# Patient Record
Sex: Male | Born: 1950 | Race: White | Hispanic: No | Marital: Single | Smoking: Former smoker
Health system: Southern US, Community
[De-identification: ages and names within clinical notes are randomized; demographics above are authoritative.]

## PROBLEM LIST (undated history)

## (undated) DIAGNOSIS — M549 Dorsalgia, unspecified: Secondary | ICD-10-CM

## (undated) DIAGNOSIS — C801 Malignant (primary) neoplasm, unspecified: Secondary | ICD-10-CM

## (undated) DIAGNOSIS — R911 Solitary pulmonary nodule: Secondary | ICD-10-CM

## (undated) DIAGNOSIS — M1990 Unspecified osteoarthritis, unspecified site: Secondary | ICD-10-CM

## (undated) DIAGNOSIS — J449 Chronic obstructive pulmonary disease, unspecified: Secondary | ICD-10-CM

## (undated) DIAGNOSIS — G8929 Other chronic pain: Secondary | ICD-10-CM

## (undated) DIAGNOSIS — I639 Cerebral infarction, unspecified: Secondary | ICD-10-CM

## (undated) DIAGNOSIS — H9190 Unspecified hearing loss, unspecified ear: Secondary | ICD-10-CM

## (undated) DIAGNOSIS — R112 Nausea with vomiting, unspecified: Secondary | ICD-10-CM

## (undated) DIAGNOSIS — Z9889 Other specified postprocedural states: Secondary | ICD-10-CM

## (undated) DIAGNOSIS — H8109 Meniere's disease, unspecified ear: Secondary | ICD-10-CM

## (undated) HISTORY — DX: Meniere's disease, unspecified ear: H81.09

## (undated) HISTORY — PX: NASAL SINUS SURGERY: SHX719

## (undated) HISTORY — PX: OTHER SURGICAL HISTORY: SHX169

## (undated) HISTORY — PX: BACK SURGERY: SHX140

## (undated) HISTORY — PX: BLADDER SURGERY: SHX569

---

## 2001-03-19 ENCOUNTER — Other Ambulatory Visit: Admission: RE | Admit: 2001-03-19 | Discharge: 2001-03-22 | Payer: Self-pay | Admitting: Urology

## 2003-05-04 ENCOUNTER — Ambulatory Visit (HOSPITAL_COMMUNITY): Admission: RE | Admit: 2003-05-04 | Discharge: 2003-05-04 | Payer: Self-pay | Admitting: Urology

## 2003-07-30 ENCOUNTER — Ambulatory Visit (HOSPITAL_COMMUNITY): Admission: RE | Admit: 2003-07-30 | Discharge: 2003-07-30 | Payer: Self-pay | Admitting: Internal Medicine

## 2004-03-14 ENCOUNTER — Ambulatory Visit (HOSPITAL_COMMUNITY): Admission: RE | Admit: 2004-03-14 | Discharge: 2004-03-14 | Payer: Self-pay | Admitting: General Surgery

## 2004-09-27 ENCOUNTER — Encounter (HOSPITAL_COMMUNITY): Admission: RE | Admit: 2004-09-27 | Discharge: 2004-10-27 | Payer: Self-pay | Admitting: Internal Medicine

## 2005-02-15 ENCOUNTER — Ambulatory Visit (HOSPITAL_COMMUNITY): Admission: RE | Admit: 2005-02-15 | Discharge: 2005-02-15 | Payer: Self-pay | Admitting: Urology

## 2006-01-03 ENCOUNTER — Ambulatory Visit (HOSPITAL_COMMUNITY): Admission: RE | Admit: 2006-01-03 | Discharge: 2006-01-03 | Payer: Self-pay | Admitting: Internal Medicine

## 2006-07-16 ENCOUNTER — Ambulatory Visit (HOSPITAL_COMMUNITY): Admission: RE | Admit: 2006-07-16 | Discharge: 2006-07-16 | Payer: Self-pay | Admitting: Internal Medicine

## 2006-12-13 ENCOUNTER — Emergency Department (HOSPITAL_COMMUNITY): Admission: EM | Admit: 2006-12-13 | Discharge: 2006-12-13 | Payer: Self-pay | Admitting: Emergency Medicine

## 2007-12-23 ENCOUNTER — Ambulatory Visit (HOSPITAL_COMMUNITY): Admission: RE | Admit: 2007-12-23 | Discharge: 2007-12-23 | Payer: Self-pay | Admitting: Neurosurgery

## 2008-01-28 ENCOUNTER — Ambulatory Visit (HOSPITAL_COMMUNITY): Admission: RE | Admit: 2008-01-28 | Discharge: 2008-01-28 | Payer: Self-pay | Admitting: Neurosurgery

## 2008-03-23 ENCOUNTER — Inpatient Hospital Stay (HOSPITAL_COMMUNITY): Admission: RE | Admit: 2008-03-23 | Discharge: 2008-03-25 | Payer: Self-pay | Admitting: Neurosurgery

## 2008-08-10 ENCOUNTER — Encounter (INDEPENDENT_AMBULATORY_CARE_PROVIDER_SITE_OTHER): Payer: Self-pay | Admitting: Urology

## 2008-08-10 ENCOUNTER — Ambulatory Visit (HOSPITAL_COMMUNITY): Admission: RE | Admit: 2008-08-10 | Discharge: 2008-08-10 | Payer: Self-pay | Admitting: Urology

## 2009-08-19 ENCOUNTER — Encounter: Admission: RE | Admit: 2009-08-19 | Discharge: 2009-08-19 | Payer: Self-pay | Admitting: Neurosurgery

## 2009-09-02 ENCOUNTER — Inpatient Hospital Stay (HOSPITAL_COMMUNITY): Admission: RE | Admit: 2009-09-02 | Discharge: 2009-09-02 | Payer: Self-pay | Admitting: Neurosurgery

## 2010-07-03 ENCOUNTER — Encounter: Payer: Self-pay | Admitting: Neurosurgery

## 2010-07-04 ENCOUNTER — Encounter: Payer: Self-pay | Admitting: Neurosurgery

## 2010-07-04 ENCOUNTER — Encounter: Payer: Self-pay | Admitting: Internal Medicine

## 2010-09-04 LAB — BASIC METABOLIC PANEL
BUN: 7 mg/dL (ref 6–23)
CO2: 28 mEq/L (ref 19–32)
Calcium: 8.9 mg/dL (ref 8.4–10.5)
Chloride: 100 mEq/L (ref 96–112)
Creatinine, Ser: 1.07 mg/dL (ref 0.4–1.5)
GFR calc Af Amer: >60 mL/min (ref >60)
GFR calc non Af Amer: >60 mL/min (ref >60)
Glucose, Bld: 98 mg/dL (ref 70–99)
Potassium: 3.8 mEq/L (ref 3.5–5.1)
Sodium: 134 mEq/L — ABNORMAL LOW (ref 135–145)

## 2010-09-04 LAB — SURGICAL PCR SCREEN: Staphylococcus aureus: POSITIVE — AB

## 2010-09-04 LAB — CBC
HCT: 36.3 % — ABNORMAL LOW (ref 39.0–52.0)
MCV: 89.9 fL (ref 78.0–100.0)
Platelets: 229 10*3/uL (ref 150–400)
RDW: 14.1 % (ref 11.5–15.5)
WBC: 9.3 10*3/uL (ref 4.0–10.5)

## 2010-09-27 LAB — CBC
MCHC: 34.5 g/dL (ref 30.0–36.0)
RDW: 14.4 % (ref 11.5–15.5)

## 2010-09-27 LAB — BASIC METABOLIC PANEL
CO2: 31 mEq/L (ref 19–32)
Calcium: 9.2 mg/dL (ref 8.4–10.5)
Creatinine, Ser: 0.92 mg/dL (ref 0.4–1.5)
GFR calc Af Amer: >60 mL/min (ref >60)
GFR calc non Af Amer: >60 mL/min (ref >60)
Glucose, Bld: 72 mg/dL (ref 70–99)

## 2010-10-25 NOTE — Op Note (Signed)
NAMEDEMETRIAS, Joshua [name] NO.:  [id number]   MEDICAL RECORD NO.:  [id number]          PATIENT TYPE:  INP   LOCATION:  3528                         FACILITY:  [facility]   PHYSICIAN:  Cristi Loron, M.D.DATE OF BIRTH:  1950-08-09   DATE OF PROCEDURE:  03/23/2008  DATE OF DISCHARGE:                               OPERATIVE REPORT   BRIEF HISTORY:  The patient is a 60 year old white male who suffered  from back and leg pain consistent with neurogenic claudication.  He  failed medical management, was worked up with a lumbar MRI and mild CT,  which demonstrated the patient had spinal stenosis and degenerative  changes at L2-3, L3-4, and L4-5 and to some extent at L5-S1.  I  discussed the various treatment options with the patient and his family  including surgery.  The patient has weighed the risks, benefits, and  alternatives of surgery, and decided to proceed with a decompressive  laminectomy.   PREOPERATIVE DIAGNOSES:  L2-3, L3-4, and L4-5 spinal stenosis, disk  degeneration, and lumbar radiculopathy/facet arthropathy.   POSTOPERATIVE DIAGNOSES:  L2-3, L3-4, and L4-5 spinal stenosis, disk  degeneration, and lumbar radiculopathy/facet arthropathy.   PROCEDURE:  Decompressive laminectomy at L3 and L4 with two laminotomies  to decompress the bilateral L2, L3, L4, and L5 nerve roots using  microdissection.   SURGEON:  Cristi Loron, MD   ASSISTANT:  Hilda Lias, MD   ANESTHESIA:  General endotracheal.   ESTIMATED BLOOD LOSS:  100 mL.   SPECIMENS:  None.   DRAINS:  None.   COMPLICATIONS:  None.   DESCRIPTION OF PROCEDURE:  The patient was brought to the operating room  by anesthesia team.  General endotracheal anesthesia was induced.  The  patient was turned to the prone position on Wilson frame.  His  lumbosacral region was then prepared with Betadine scrub and Betadine  solution.  Sterile drapes were applied.  I then injected the area to be  incised  with Marcaine with epinephrine solution.  I used a scalpel to  make a linear midline incision over the L2-3, L3-4, and L4-5  interspaces.  I used electrocautery to perform a bilateral subperiosteal  dissection exposing spinous process and lamina of L2, L3, L4, and L5.  We obtained intraoperative radiograph to confirm our location.  We then  inserted the [facility] retractor for exposure.   We began the decompression by incising the L2-3, L3-4, and L4-5  interspinous ligament with the scalpel.  We used a Leksell rongeur to  remove the spinous process of L3 and L4 and the caudal aspect of L2  spinous process.  We then used a high-speed drill to perform bilateral  L4, L3, and L2 laminotomies.  I then used a Kerrison punch to complete  the laminectomy at L3 and L4 and widened the laminotomies at L2  bilaterally.  We removed the ligamentum flavum at L2-3, L3-4, and L4-5.   We then brought the operative microscope into field and under its  magnification illumination, we completed the  microdissection/decompression.  We freed up the dura and the nerve  roots  from the epidural tissue using microdissection.  I then performed  foraminotomies about the bilateral L2, L3, L4, and L5 nerve roots  removing the excess ligamentum flavum from lateral recesses.  We then  inspected the L2-3, L3-4, and L4-5 intervertebral disk, which showed  some mild bulging, but no significant disk herniations.  We then  palpated along the ventral surface of the thecal sac and along the exit  route of the bilateral L2, L3, L4, and L5 nerve roots and they were well  decompressed.  We obtained hemostasis using bipolar electrocautery.  We  irrigated the wound out with bacitracin solution and then obtained  hemostasis using bipolar electrocautery.  I then removed the [facility]  retractor, and then reapproximated the patient's thoracolumbar fascia  with interrupted #1 Vicryl suture, subcutaneous tissue with interrupted  2-0  Vicryl suture, and the skin with Steri-Strips and benzoin.  The  wound was then coated with bacitracin ointment.  Sterile dressing was  applied.  Drapes were removed.  The patient was subsequently returned to  supine position where he was extubated by anesthesia team and he was  transported to the Post Anesthesia Care Unit in stable condition.  All  sponge, instrument, and needle counts were correct at the end of the  case.      Cristi Loron, M.D.  Electronically Signed     JDJ/MEDQ  D:  03/23/2008  T:  03/24/2008  Job:  [id number]

## 2010-10-25 NOTE — Op Note (Signed)
NAMELEORY, ALLINSON                 ACCOUNT NO.:  [id number]   MEDICAL RECORD NO.:  [id number]          PATIENT TYPE:  AMB   LOCATION:  DAY                           FACILITY:  [facility]   PHYSICIAN:  Dennie Maizes, M.D.   DATE OF BIRTH:  1951-06-10   DATE OF PROCEDURE:  08/10/2008  DATE OF DISCHARGE:                               OPERATIVE REPORT   PREOPERATIVE DIAGNOSIS:  Large symptomatic left spermatocele.   POSTOPERATIVE DIAGNOSIS:  Large symptomatic left spermatocele.   OPERATIVE PROCEDURE:  Left spermatocelectomy.   ANESTHESIA:  Spinal.   SURGEON:  Dennie Maizes, MD   COMPLICATIONS:  None.   ESTIMATED BLOOD LOSS:  Minimal.   DRAINS:  Penrose drain x1.   SPECIMEN:  Left spermatocele which was sent to pathology.   INDICATIONS FOR THE PROCEDURE:  The patient is a 60 year old male who  had a large symptomatic left spermatocele.  He was taken to the  operating room today for his left spermatocelectomy.   DESCRIPTION OF PROCEDURE:  Spinal anesthesia was induced, and the  patient was placed on the OR table in the supine position.  The lower  abdomen and genitalia were prepped and draped in a sterile fashion.  Examination revealed a large multiloculated left spermatocele measuring  about 15 x 10 cm in size.  The left transverse hemiscrotal incision was  then made.  The layers of scrotal were incised to expose the  spermatocele.  The spermatocele was then separated by sharp and blunt  dissection.  It was delivered out of the scrotal incision.  The fascia  covering the spermatocele was then excised, incised along the testis and  epididymis.  By sharp and blunt dissection, the entire multilocular  spermatocele was separated from the top of the testis and epididymis.  The bleeding points were then cauterized.  There is a small defect in  the top of the testes which was closed by approximating the tunica  albuginea with 3-0 Vicryl sutures.  The fascia over the epididymis and  testis was then closed using 3-0 Vicryl sutures.  Hemostasis was  obtained by cauterization.  There is slight oozing of blood at this  time.  Testis was repositioned in the scrotum.  The Penrose drain was  left in the scrotum and fixed to the skin using the 0 Prolene sutures.  The scrotal incision was then closed in 2 layers using 3-0 Vicryl for  the dartos and  subcutaneous tissues and 3-0 chromic gut for the skin.  The estimated  blood loss was minimal.  The sponges and instruments were correct x2.  The scrotal dressing was applied.  The patient was then transferred to  the PACU in a satisfactory condition.      Dennie Maizes, M.D.  Electronically Signed     SK/MEDQ  D:  08/10/2008  T:  08/11/2008  Job:  [id number]

## 2010-10-25 NOTE — H&P (Signed)
Joshua [name], Joshua [name] NO.:  [id number]   MEDICAL RECORD NO.:  [id number]          PATIENT TYPE:  AMB   LOCATION:  DAY                           FACILITY:  [facility]   PHYSICIAN:  Dennie Maizes, M.D.   DATE OF BIRTH:  10/13/1950   DATE OF ADMISSION:  08/10/2008  DATE OF DISCHARGE:  LH                              HISTORY & PHYSICAL   He is coming to the [facility] on August 10, 2008.   CHIEF COMPLAINT:  Left scrotal swelling and discomfort.  History of  carcinoma of the bladder.   HISTORY OF PRESENT ILLNESS:  A 60 year old male has been under my care  for several years.  He complains of having scrotal swelling for several  years.  Says the swelling has been increasing in size and causing some  discomfort.  There is no history of trauma to the scrotum.   He has undergone transverse resection of bladder tumor in 1998.  Superficial transitional carcinoma of bladder.  The patient has not had  any bladder tumor recurrence.  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   The patient has moderate prostate obstructive symptoms at present.  He  has good urinary flow.  Urinary frequency times 6 to 8, nocturia times 2  to 3.  Denied having any voiding difficulty or gross hematuria at  present.   PAST MEDICAL HISTORY:  History of chronic back pain and vertigo.  History of carcinoma bladder.   MEDICATIONS:  Oxycodone and Valium.   ALLERGIES:  None.   EXAMINATION:  HEAD, EYES, EARS, NOSE AND THROAT:  Normal.  The patient  is hard of hearing and uses hearing aids.  LUNGS:  Clear to auscultation.  HEART: Regular rate and rhythm.  No murmurs.  ABDOMEN:  Soft.  No palpable flank mass or costovertebral angle  tenderness.  Bladder is not palpable.  Penis normal.  Scrotum, right  testis is normal with multiloculated cystic in the left hemiscrotum  suggestive spermatocele.  Left testis is normal.  RECTAL:  Benign prostate  information.   IMPRESSION:  Symptomatic left spermatocele, history of carcinoma  bladder.   PLAN:  I have discussed the patient regarding management options. He is  scheduled to  undergo excision of the left spermatocele at [facility].  I have informed regarding the diagnosis, operative details,  alternate treatments, outcome, possible risks and complications, and he  has agreed for the procedure to be done.      Dennie Maizes, M.D.  Electronically Signed     SK/MEDQ  D:  08/09/2008  T:  08/09/2008  Job:  [id number]

## 2010-10-28 NOTE — H&P (Signed)
[name], [name]                           ACCOUNT NO.:  [id number]   MEDICAL RECORD NO.:  [id number]                   PATIENT TYPE:  AMB   LOCATION:  DAY                                  FACILITY:  [facility]   PHYSICIAN:  Dennie Maizes, M.D.                DATE OF BIRTH:  1951-02-03   DATE OF ADMISSION:  05/04/2003  DATE OF DISCHARGE:                                HISTORY & PHYSICAL   CHIEF COMPLAINT:  History of carcinoma of the bladder, abnormal urine  cytology.   HISTORY OF PRESENT ILLNESS:  This 60 year old male was evaluated for  hematuria and found to have superficial transitional carcinoma of the  bladder.  He has undergone transurethral resection of the bladder tumor in  March 1998.  He has done well.  He has not had any bladder tumor recurrence.  He denied having any voiding difficulty or gross hematuria.  He has  occasional flank pain and lower back pain.  He has urinary frequency x8 to  10 and nocturia x2.  His recent PSA is normal at 1.19 ng/ml.  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  He is brought to the [facility] today for cystoscopy,  bilateral retrograde pyelograms, multiple bladder biopsies, and possible  transurethral resection of bladder tumor.   PAST MEDICAL HISTORY:  History of Meniere's disease.   MEDICATIONS:  Hydrochlorothiazide one p.o. q.d.   ALLERGIES:  None.   PHYSICAL EXAMINATION:  HEENT:  Normal.  The patient is hard of hearing.  NECK:  No masses.  LUNGS:  Clear to auscultation.  HEART:  Regular rate and rhythm, no murmurs.  ABDOMEN:  Soft, no palpable frank mass, no CVA tenderness, bowel not  palpable.  Penis normal, testes are normal.  There is a 2 cm size small  right spermatocele.  There is an 8 cm size left spermatocele.  RECTAL:  35 g benign prostate.   IMPRESSION:  History of carcinoma of the bladder.  Abnormal urine cytology.   PLAN:  Cystoscopy, bilateral retrograde  pyelograms, bladder biopsies,  possible transurethral resection of bladder tumor [facility].  I have discussed with the patient regarding the diagnosis,  operative details, alternative treatment, outcome, possible risks and  complications, and he has agreed for the procedure to be done.     ___________________________________________                                         Dennie Maizes, M.D.   SK/MEDQ  D:  05/03/2003  T:  05/04/2003  Job:  [id number]   cc:   Kirk Ruths, M.D.  [address]  [place]  [place] [address]  Fax: [phone]

## 2010-10-28 NOTE — Op Note (Signed)
Joshua [name], Joshua [name]                           ACCOUNT NO.:  [id number]   MEDICAL RECORD NO.:  [id number]                   PATIENT TYPE:  AMB   LOCATION:  DAY                                  FACILITY:  [facility]   PHYSICIAN:  Dennie Maizes, M.D.                DATE OF BIRTH:  1951/03/12   DATE OF PROCEDURE:  05/04/2003  DATE OF DISCHARGE:                                 OPERATIVE REPORT   PREOPERATIVE DIAGNOSIS:  History of carcinoma of the bladder, abnormal urine  cytology.   POSTOPERATIVE DIAGNOSIS:  History of carcinoma of the bladder, abnormal  urine cytology.   PROCEDURE:  1. Cystoscopy.  2. Bilateral retrograde pyelograms.  3. Collection of urine from both renal pelves for urine cytology.  4. Multiple bladder biopsies.   ANESTHESIA:  Spinal   SURGEON:  Dennie Maizes, M.D.   COMPLICATIONS:  None.   DRAINS:  None.   INDICATIONS FOR PROCEDURE:  This 60 year old male has a history of  superficial transitional carcinoma of the bladder for several years. His  recent urine cytology was abnormal suggestive of transitional carcinoma.  The patient was taken to the OR for cystoscopy, bilateral retrograde  pyelograms, multiple bladder biopsies and possible transurethral resection  of bladder tumor.   DESCRIPTION OF PROCEDURE:  Spinal anesthesia was induced and the patient was  placed on the OR table in the dorsal lithotomy position.  The lower abdomen  and genitalia were prepped and draped in a sterile fashion.  Cystoscopy was  done with a 25 [place] scope.  The urethra and prostate were normal.  The  bladder was then closely examined.  The trigone, ureteral orifices, and  bladder mucosa were normal.  There was no evidence of any recurrent bladder  tumor or carcinoma in situ.   A 5 French wedge catheter was then placed in the right ureteral orifice and  a retrograde pyelogram was done by using about 7 mL of Renografin 60.  A  similarly procedure as done on the left side using  C-arm fluoroscopy.  Bilateral retrograde pyelograms were normal.  The ureters were normal.  The  pelvicalyceal system was normal.  There was no evidence of any filling  defect or obstruction.   A 5 French __________ tip catheter was then placed in the right ureteral  orifice and urine was collected from the renal pelvis and sent for cytology.  Another catheter was placed on the left side and urine from the left renal  pelvis was sent for urine cytology.   Mucosal biopsies from the right lateral wall, posterior wall, left lateral  wall and trigone of the bladder was then done. The biopsy sites were then  fulgurated with a green-walled electrode.  There was no active bleeding at  this time.  The instruments were removed.  The patient was transferred to  the PACU in satisfactory condition.      ___________________________________________  Dennie Maizes, M.D.   SK/MEDQ  D:  05/04/2003  T:  05/04/2003  Job:  [id number]   cc:   Kirk Ruths, M.D.  [address]  [place]  [place] [address]  Fax: [phone]

## 2010-10-28 NOTE — H&P (Signed)
Joshua [name], Joshua [name] NO.:  [id number]   MEDICAL RECORD NO.:  [id number]           PATIENT TYPE:   LOCATION:                                 FACILITY:   PHYSICIAN:  Dalia Heading, M.D.  DATE OF BIRTH:  [birth year]   DATE OF ADMISSION:  DATE OF DISCHARGE:  LH                                HISTORY & PHYSICAL   CHIEF COMPLAINT:  Hematochezia, history of colon polyps.   HISTORY OF PRESENT ILLNESS:  The patient is a 60 year old white male who is  referred for endoscopic evaluation.  He needs a colonoscopy for follow-up of  colon polyps removed four years ago and recent hematochezia.  No abdominal  pain, weight loss, nausea, vomiting, diarrhea, constipation, or melena have  been noted.  There is no family history of colon carcinoma.  No history of  hemorrhoidal disease.   PAST MEDICAL HISTORY:  Chronic back pain.   PAST SURGICAL HISTORY:  History of bladder cancer, colonoscopy and  polypectomy four years ago.   CURRENT MEDICATIONS:  Pain pills, an aspirin.   ALLERGIES:  No known drug allergies.   REVIEW OF SYSTEMS:  The patient does drink and smoke.  He denies any other  cardiopulmonary difficulties.   PHYSICAL EXAMINATION:  GENERAL:  The patient is a well-developed, well-  nourished white male in no acute distress.  He is hard of hearing.  VITAL SIGNS:  He is afebrile and vital signs are stable.  CHEST:  Lungs are clear to auscultation with equal breath sounds  bilaterally.  CARDIAC:  Regular rate and rhythm without S3, S4, or murmurs.  ABDOMEN:  Soft, nontender, nondistended.  No hepatosplenomegaly or masses  are noted.  RECTAL:  Deferred to the procedure.   IMPRESSION:  Hematochezia, history of colon polyps.   PLAN:  The patient is scheduled for a colonoscopy on March 14, 2004.  The  risks and benefits of the procedure, including bleeding and perforation,  were fully explained to the patient, who gave informed consent.       ___________________________________________  Dalia Heading, M.D.    MAJ/MEDQ  D:  03/10/2004  T:  03/10/2004  Job:  [id number]   cc:   Madelin Rear. Sherwood Gambler, M.D.  [address]  [place]  [place] [address]  Fax: [phone]

## 2010-11-02 ENCOUNTER — Ambulatory Visit (HOSPITAL_COMMUNITY): Payer: Self-pay

## 2010-11-25 ENCOUNTER — Other Ambulatory Visit (HOSPITAL_COMMUNITY): Payer: Self-pay | Admitting: Internal Medicine

## 2010-11-25 DIAGNOSIS — M545 Low back pain, unspecified: Secondary | ICD-10-CM

## 2010-11-25 DIAGNOSIS — M549 Dorsalgia, unspecified: Secondary | ICD-10-CM

## 2010-11-28 ENCOUNTER — Inpatient Hospital Stay (HOSPITAL_COMMUNITY): Admission: RE | Admit: 2010-11-28 | Payer: Self-pay | Source: Ambulatory Visit

## 2010-12-05 ENCOUNTER — Ambulatory Visit (HOSPITAL_COMMUNITY)
Admission: RE | Admit: 2010-12-05 | Discharge: 2010-12-05 | Disposition: A | Discharge status: Home / Self Care | Payer: Medicare Other | Source: Ambulatory Visit | Attending: Internal Medicine | Admitting: Internal Medicine

## 2010-12-05 DIAGNOSIS — M545 Low back pain, unspecified: Secondary | ICD-10-CM | POA: Insufficient documentation

## 2010-12-05 DIAGNOSIS — M51379 Other intervertebral disc degeneration, lumbosacral region without mention of lumbar back pain or lower extremity pain: Secondary | ICD-10-CM | POA: Insufficient documentation

## 2010-12-05 DIAGNOSIS — C679 Malignant neoplasm of bladder, unspecified: Secondary | ICD-10-CM | POA: Insufficient documentation

## 2010-12-05 DIAGNOSIS — IMO0002 Reserved for concepts with insufficient information to code with codable children: Secondary | ICD-10-CM | POA: Insufficient documentation

## 2010-12-05 DIAGNOSIS — M5126 Other intervertebral disc displacement, lumbar region: Secondary | ICD-10-CM | POA: Insufficient documentation

## 2010-12-05 DIAGNOSIS — M549 Dorsalgia, unspecified: Secondary | ICD-10-CM

## 2010-12-05 DIAGNOSIS — M5137 Other intervertebral disc degeneration, lumbosacral region: Secondary | ICD-10-CM | POA: Insufficient documentation

## 2011-03-14 LAB — BASIC METABOLIC PANEL
Calcium: 9.4
GFR calc Af Amer: >60
GFR calc non Af Amer: >60
Potassium: 3.6
Sodium: 134 — ABNORMAL LOW

## 2011-03-14 LAB — CBC
HCT: 38.9 — ABNORMAL LOW
Hemoglobin: 13.1
RBC: 4.37
WBC: 6.8

## 2011-03-28 LAB — URINALYSIS, ROUTINE W REFLEX MICROSCOPIC
Ketones, ur: NEGATIVE
Nitrite: NEGATIVE
Protein, ur: NEGATIVE
pH: 7

## 2011-03-28 LAB — DIFFERENTIAL
Basophils Relative: 0
Eosinophils Absolute: 0
Lymphs Abs: 1
Neutro Abs: 5.5
Neutrophils Relative %: 78 — ABNORMAL HIGH

## 2011-03-28 LAB — COMPREHENSIVE METABOLIC PANEL
ALT: 16
BUN: 4 — ABNORMAL LOW
CO2: 26
Calcium: 8.8
Creatinine, Ser: 0.66
GFR calc non Af Amer: >60
Glucose, Bld: 116 — ABNORMAL HIGH
Sodium: 137

## 2011-03-28 LAB — CBC
HCT: 37 — ABNORMAL LOW
Hemoglobin: 13
MCHC: 35.1
MCV: 87.6
RBC: 4.23

## 2011-03-28 LAB — LIPASE, BLOOD: Lipase: 16

## 2011-08-04 ENCOUNTER — Emergency Department (HOSPITAL_COMMUNITY)
Admission: EM | Admit: 2011-08-04 | Discharge: 2011-08-04 | Disposition: A | Discharge status: Home / Self Care | Payer: No Typology Code available for payment source | Attending: Emergency Medicine | Admitting: Emergency Medicine

## 2011-08-04 ENCOUNTER — Emergency Department (HOSPITAL_COMMUNITY): Payer: No Typology Code available for payment source

## 2011-08-04 ENCOUNTER — Encounter (HOSPITAL_COMMUNITY): Payer: Self-pay | Admitting: *Deleted

## 2011-08-04 DIAGNOSIS — F17200 Nicotine dependence, unspecified, uncomplicated: Secondary | ICD-10-CM | POA: Insufficient documentation

## 2011-08-04 DIAGNOSIS — Z859 Personal history of malignant neoplasm, unspecified: Secondary | ICD-10-CM | POA: Insufficient documentation

## 2011-08-04 DIAGNOSIS — M545 Low back pain, unspecified: Secondary | ICD-10-CM | POA: Insufficient documentation

## 2011-08-04 DIAGNOSIS — S161XXA Strain of muscle, fascia and tendon at neck level, initial encounter: Secondary | ICD-10-CM

## 2011-08-04 DIAGNOSIS — S139XXA Sprain of joints and ligaments of unspecified parts of neck, initial encounter: Secondary | ICD-10-CM | POA: Insufficient documentation

## 2011-08-04 DIAGNOSIS — G8929 Other chronic pain: Secondary | ICD-10-CM | POA: Insufficient documentation

## 2011-08-04 DIAGNOSIS — M542 Cervicalgia: Secondary | ICD-10-CM | POA: Insufficient documentation

## 2011-08-04 DIAGNOSIS — R51 Headache: Secondary | ICD-10-CM | POA: Insufficient documentation

## 2011-08-04 HISTORY — DX: Malignant (primary) neoplasm, unspecified: C80.1

## 2011-08-04 HISTORY — DX: Dorsalgia, unspecified: M54.9

## 2011-08-04 MED ORDER — HYDROMORPHONE HCL 2 MG PO TABS: 2.0000 mg | ORAL_TABLET | ORAL | Status: AC | PRN
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
## 2012-10-29 ENCOUNTER — Encounter: Payer: Self-pay | Admitting: Physical Medicine & Rehabilitation

## 2012-11-13 ENCOUNTER — Encounter: Payer: Self-pay | Admitting: Physical Medicine & Rehabilitation

## 2012-11-13 ENCOUNTER — Encounter: Payer: Medicare Other | Attending: Physical Medicine & Rehabilitation | Admitting: Physical Medicine & Rehabilitation

## 2012-11-13 VITALS — BP 101/59 | HR 75 | Resp 14 | Ht 70.0 in | Wt 177.0 lb

## 2012-11-13 DIAGNOSIS — M545 Low back pain, unspecified: Secondary | ICD-10-CM | POA: Insufficient documentation

## 2012-11-13 DIAGNOSIS — G609 Hereditary and idiopathic neuropathy, unspecified: Secondary | ICD-10-CM | POA: Insufficient documentation

## 2012-11-13 DIAGNOSIS — IMO0002 Reserved for concepts with insufficient information to code with codable children: Secondary | ICD-10-CM

## 2012-11-13 DIAGNOSIS — Z720 Tobacco use: Secondary | ICD-10-CM

## 2012-11-13 DIAGNOSIS — F17200 Nicotine dependence, unspecified, uncomplicated: Secondary | ICD-10-CM | POA: Insufficient documentation

## 2012-11-13 DIAGNOSIS — Z5181 Encounter for therapeutic drug level monitoring: Secondary | ICD-10-CM

## 2012-11-13 DIAGNOSIS — M47817 Spondylosis without myelopathy or radiculopathy, lumbosacral region: Secondary | ICD-10-CM | POA: Insufficient documentation

## 2012-11-13 DIAGNOSIS — Z79899 Other long term (current) drug therapy: Secondary | ICD-10-CM

## 2012-11-13 DIAGNOSIS — M961 Postlaminectomy syndrome, not elsewhere classified: Secondary | ICD-10-CM

## 2012-11-13 MED ORDER — MELOXICAM 15 MG PO TABS: 15.0000 mg | ORAL_TABLET | Freq: Every day | ORAL | Status: DC

## 2012-11-13 MED ORDER — GABAPENTIN 300 MG PO CAPS: 300.0000 mg | ORAL_CAPSULE | Freq: Three times a day (TID) | ORAL | Status: DC

## 2012-11-13 NOTE — Progress Notes (Signed)
Subjective:    Patient ID: Joshua [name], male    DOB: [birth year], 62 y.o.   MRN: [id number]  HPI   This is a 62 yo white male with a 5 year hx of increased low back pain. He has had two separate low back surgeries by Dr. Lovell Sheehan, the last of which was 2 yrs ago or so. It appears that he had laminectomies and/or diskectomies.    His most recent MRI is from 6.2012 and revealed the following:  T12-L1: Mild facet degeneration. Normal appearing disc.  L1-2: Moderate disc degeneration with disc bulging and  spondylosis. Mild spinal stenosis. This is unchanged.  L2-3: Postop laminectomy on the left. Interval removal of large  left-sided disc fragment. There is vertebral spurring and mild  facet degeneration. Mild spinal stenosis is present without  recurrent disc protrusion.  L3-4: Decompressive laminectomy bilaterally. Disc bulging and  spondylosis are present. Left foraminal disc protrusion is  unchanged. Foraminal encroachment bilaterally, left greater than  right is unchanged. No significant spinal stenosis.  L4-5: Decompressive laminectomy bilaterally. Disc degeneration  and spondylosis contribute to mild foraminal narrowing bilaterally.  L5-S1: Central disc and osteophyte complex is unchanged. There  may be mild impingement of the right S1 nerve root.  His back has gradually gotten worse over the last two years. The pain did not improve after the last surgery. The pain involves his low back and shoots into his legs to his feet. It typically shoots down the back of his legs. It may be associated with numbness or weakness. He notices the shooting pain after stands for a sustained period of time. It may bother him even after a few minutes of standing at his kitchen counter. He finds it difficult to getting in and out of the grocery store without leaving in excruciating pain. He will lay down to make the symptoms go away, sometimes as short as 20 minutes. He lies down about 4-5 times per day.    For pain he takes oxycodone and kadian. He typically takes eight, 30mg  oxycodone per day and 60mg  kadian twice per day. The oxycodone gives him more relief.    Pain Inventory Average Pain 6 Pain Right Now 6 My pain is constant, stabbing and aching  In the last 24 hours, has pain interfered with the following? General activity 6 Relation with others 6 Enjoyment of life 8 What TIME of day is your pain at its worst? evening Sleep (in general) Fair  Pain is worse with: walking, inactivity and standing Pain improves with: rest, heat/ice, therapy/exercise and medication Relief from Meds: 9  Mobility ability to climb steps?  yes do you drive?  yes  Function disabled: date disabled 62  Neuro/Psych bladder control problems trouble walking anxiety  Prior Studies Any changes since last visit?  no  Physicians involved in your care Any changes since last visit?  yes Primary care [place]   History reviewed. No pertinent family history. History   Social History  . Marital Status: Single    Spouse Name: N/A    Number of Children: N/A  . Years of Education: N/A   Social History Main Topics  . Smoking status: Current Every Day Smoker  . Smokeless tobacco: Current User  . Alcohol Use: No  . Drug Use: None  . Sexually Active: None   Other Topics Concern  . None   Social History Narrative  . None   Past Surgical History  Procedure Laterality Date  . Back surgery    .  Nasal sinus surgery    . Bladder surgery     Past Medical History  Diagnosis Date  . Cancer   . Back pain   . Meniere's disease    BP 101/59  Pulse 75  Resp 14  Ht 5\' 10"  (1.778 m)  Wt 177 lb (80.287 kg)  BMI 25.4 kg/m2  SpO2 93%    Review of Systems  Gastrointestinal: Positive for constipation.  Genitourinary: Positive for difficulty urinating.  Musculoskeletal: Positive for back pain and gait problem.  Psychiatric/Behavioral: The patient is nervous/anxious.   All other systems  reviewed and are negative.       Objective:   Physical Exam  General: Alert and oriented x 3, No apparent distress HEENT: Head is normocephalic, atraumatic, PERRLA, EOMI, sclera anicteric, oral mucosa pink and moist, dentition intact, ext ear canals clear,  Neck: Supple without JVD or lymphadenopathy Heart: Reg rate and rhythm. No murmurs rubs or gallops Chest: CTA bilaterally without wheezes, rales, or rhonchi; no distress Abdomen: Soft, non-tender, non-distended, bowel sounds positive. Extremities: No clubbing, cyanosis, or edema. Pulses are 2+ Skin: Clean and intact without signs of breakdown Neuro: Pt is cognitively appropriate with normal insight, memory, and awareness. Cranial nerves 2-12 are intact except for his hearing which is extremely poor. Sensory exam notable for stocking glove sensory loss in the legs below the knees and to a lesser extent in the hands. . Reflexes are 2+ in all 4's. Fine motor coordination is intact. No tremors. Motor function is grossly 5/5. Is able to read lips. Musculoskeletal: he was able to bend over and almost touch his toes. He had flattening of the lumbar lordotic curve. Op scar noted. With extension he experienced pain and there was an audible "pop" when he bent past 10 degrees. Facet maneuvers were equivocal to positive. SLR was negative. SST was negative. He didn't display antalgia. He did complain of increasing back pain the longer he sat up. There did appear to be some atrophy of his lumbar musculature.  Psych: Pt's affect is appropriate. Pt is cooperative            1. Lumbar spondylosis/post-laminectomy syndrome. May have some facet components also 2. Lumbar radiculitis although I don't see signs of anything more substantial on exam as he has good strength, normal reflexes. 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 3. Tobacco abuse   Plan: 1. Will initiate a trial of meloxicam as an anti-inflammatory 2. Begin a trial of  gabapentin for radicular pain, titrating up to 300mg  TID 3. Will order an MRI of his lumbar spine to assess for stability, scar tissue, etc given his persistent increase in symptoms 4. Continue with exercise program, modalities, good posture. 5. Follow up with me in one month. 45 minutes of face to face patient care time were spent during this visit. All questions were encouraged and answered.

## 2012-11-13 NOTE — Patient Instructions (Signed)
CALL ME WITH ANY PROBLEMS OR QUESTIONS (#[phone]).  HAVE A GOOD DAY  

## 2012-11-18 ENCOUNTER — Ambulatory Visit (HOSPITAL_COMMUNITY)
Admission: RE | Admit: 2012-11-18 | Discharge: 2012-11-18 | Disposition: A | Discharge status: Home / Self Care | Payer: Medicare Other | Source: Ambulatory Visit | Attending: Physical Medicine & Rehabilitation | Admitting: Physical Medicine & Rehabilitation

## 2012-11-18 ENCOUNTER — Encounter (HOSPITAL_COMMUNITY): Payer: Self-pay

## 2012-11-18 DIAGNOSIS — Z720 Tobacco use: Secondary | ICD-10-CM

## 2012-11-18 DIAGNOSIS — M47817 Spondylosis without myelopathy or radiculopathy, lumbosacral region: Secondary | ICD-10-CM

## 2012-11-18 DIAGNOSIS — Z79899 Other long term (current) drug therapy: Secondary | ICD-10-CM

## 2012-11-18 DIAGNOSIS — R209 Unspecified disturbances of skin sensation: Secondary | ICD-10-CM | POA: Insufficient documentation

## 2012-11-18 DIAGNOSIS — IMO0002 Reserved for concepts with insufficient information to code with codable children: Secondary | ICD-10-CM

## 2012-11-18 DIAGNOSIS — M961 Postlaminectomy syndrome, not elsewhere classified: Secondary | ICD-10-CM

## 2012-11-18 DIAGNOSIS — M545 Low back pain, unspecified: Secondary | ICD-10-CM | POA: Insufficient documentation

## 2012-11-18 DIAGNOSIS — Z5181 Encounter for therapeutic drug level monitoring: Secondary | ICD-10-CM

## 2012-11-20 ENCOUNTER — Telehealth: Payer: Self-pay | Admitting: Physical Medicine & Rehabilitation

## 2012-11-20 NOTE — Telephone Encounter (Signed)
There is degenerative disease on his MRI, but it is stable from 2012. This is not a surgical case. Can review/ discuss further at his follow up

## 2012-12-10 ENCOUNTER — Encounter: Payer: Self-pay | Admitting: Physical Medicine & Rehabilitation

## 2012-12-10 ENCOUNTER — Encounter: Payer: Medicare Other | Attending: Physical Medicine & Rehabilitation | Admitting: Physical Medicine & Rehabilitation

## 2012-12-10 VITALS — BP 112/68 | HR 78 | Resp 16 | Ht 70.0 in | Wt 180.0 lb

## 2012-12-10 DIAGNOSIS — M47817 Spondylosis without myelopathy or radiculopathy, lumbosacral region: Secondary | ICD-10-CM

## 2012-12-10 DIAGNOSIS — G609 Hereditary and idiopathic neuropathy, unspecified: Secondary | ICD-10-CM

## 2012-12-10 DIAGNOSIS — M961 Postlaminectomy syndrome, not elsewhere classified: Secondary | ICD-10-CM | POA: Insufficient documentation

## 2012-12-10 DIAGNOSIS — F17200 Nicotine dependence, unspecified, uncomplicated: Secondary | ICD-10-CM

## 2012-12-10 DIAGNOSIS — IMO0002 Reserved for concepts with insufficient information to code with codable children: Secondary | ICD-10-CM | POA: Insufficient documentation

## 2012-12-10 DIAGNOSIS — Z720 Tobacco use: Secondary | ICD-10-CM

## 2012-12-10 MED ORDER — OXYCODONE HCL 15 MG PO TABS: 15.0000 mg | ORAL_TABLET | Freq: Four times a day (QID) | ORAL | Status: DC | PRN

## 2012-12-10 MED ORDER — GABAPENTIN 600 MG PO TABS: 600.0000 mg | ORAL_TABLET | Freq: Three times a day (TID) | ORAL | Status: DC

## 2012-12-10 MED ORDER — MELOXICAM 15 MG PO TABS: 15.0000 mg | ORAL_TABLET | Freq: Every day | ORAL | Status: DC | PRN

## 2012-12-10 MED ORDER — OXYCODONE HCL 15 MG PO TABS: 15.0000 mg | ORAL_TABLET | ORAL | Status: DC | PRN

## 2012-12-10 MED ORDER — MORPHINE SULFATE ER 30 MG PO TBCR: 30.0000 mg | EXTENDED_RELEASE_TABLET | Freq: Two times a day (BID) | ORAL | Status: DC

## 2012-12-10 NOTE — Patient Instructions (Signed)
PLEASE REVIEW THE BACK EXERCISES AND PRINCIPLES I HAVE SHOWN YOU.  PLEASE TRY OCCASIONALLY WEARING YOUR BACK BRACE IF YOU'VE BEEN STANDING A LONG PERIOD OF TIME. DO NOT WEAR YOUR BRACE ALL DAY!

## 2012-12-10 NOTE — Progress Notes (Signed)
Subjective:    Patient ID: Joshua [name], male    DOB: 1950-06-25, 62 y.o.   MRN: [id number]  HPI  Joshua [name] is back regarding his chronic back pain. He seems to think that the meloxicam and neurontin may have helped a bit, but he continues to have significant pain. I re-imaged his back (MRI) which revealed:  L1-2: Shallow disc bulge without central canal or foraminal  stenosis.  L2-3: Status post posterior decompression. There is minimal disc  bulging but the central canal and foramina appear open.  L3-4: Status post posterior decompression. Central canal is  widely patent. A disc bulge causes left worse than right foraminal  narrowing which is not markedly changed.  L4-5: Status post posterior decompression. The central canal is  widely patent. Mild to moderate bilateral foraminal narrowing  appears unchanged.  L5-S1: Shallow disc bulge without central canal stenosis. Mild  foraminal narrowing is noted.  He likes to get into his shop, go into the store, but feels limited by his back pain. He has a lumbar brace but he doesn't routinely use it. He is not active with a stretching or strengthening program.   Pain Inventory Average Pain 9 Pain Right Now 9 My pain is dull and stabbing  In the last 24 hours, has pain interfered with the following? General activity 9 Relation with others 9 Enjoyment of life 10 What TIME of day is your pain at its worst? evening Sleep (in general) Good  Pain is worse with: standing Pain improves with: rest and medication Relief from Meds: 10  Mobility walk without assistance ability to climb steps?  yes do you drive?  yes transfers alone Do you have any goals in this area?  yes  Function retired I need assistance with the following:  household duties and shopping  Neuro/Psych bladder control problems bowel control problems trouble walking confusion depression anxiety "don't care"  Prior Studies CT/MRI  Physicians involved in your  care Any changes since last visit?  no   History reviewed. No pertinent family history. History   Social History  . Marital Status: Single    Spouse Name: N/A    Number of Children: N/A  . Years of Education: N/A   Social History Main Topics  . Smoking status: Current Every Day Smoker  . Smokeless tobacco: Current User  . Alcohol Use: No  . Drug Use: None  . Sexually Active: None   Other Topics Concern  . None   Social History Narrative  . None   Past Surgical History  Procedure Laterality Date  . Back surgery    . Nasal sinus surgery    . Bladder surgery     Past Medical History  Diagnosis Date  . Cancer   . Back pain   . Meniere's disease    BP 112/68  Pulse 78  Resp 16  Ht 5\' 10"  (1.778 m)  Wt 180 lb (81.647 kg)  BMI 25.83 kg/m2  SpO2 95%     Review of Systems  Gastrointestinal: Positive for diarrhea and constipation.  Genitourinary: Positive for difficulty urinating.  Musculoskeletal: Positive for gait problem.  Psychiatric/Behavioral: Positive for confusion and dysphoric mood. The patient is nervous/anxious.   All other systems reviewed and are negative.       Objective:   Physical Exam  General: Alert and oriented x 3, No apparent distress  HEENT: Head is normocephalic, atraumatic, PERRLA, EOMI, sclera anicteric, oral mucosa pink and moist, dentition intact, ext ear canals clear,  Neck: Supple without JVD or lymphadenopathy  Heart: Reg rate and rhythm. No murmurs rubs or gallops  Chest: CTA bilaterally without wheezes, rales, or rhonchi; no distress  Abdomen: Soft, non-tender, non-distended, bowel sounds positive.  Extremities: No clubbing, cyanosis, or edema. Pulses are 2+  Skin: Clean and intact without signs of breakdown  Neuro: Pt is cognitively appropriate with normal insight, memory, and awareness. Cranial nerves 2-12 are intact except for his hearing which is extremely poor. Sensory exam notable for stocking glove sensory loss in the  legs below the knees and to a lesser extent in the hands. . Reflexes are 2+ in all 4's. Fine motor coordination is intact. No tremors. Motor function is grossly 5/5. Is able to read lips.  Musculoskeletal: he was able to bend over and almost touch his toes. He had flattening of the lumbar lordotic curve. Op scar noted.  Extension caused more discomfort than anything. Facet maneuvers were equivocal to positive. SLR was negative. SST was negative. He didn't display antalgia. He did complain of increasing back pain the longer he sat up. There did appear to be some atrophy of his lumbar musculature.  Psych: Pt's affect is appropriate. Pt is cooperative     1. Lumbar spondylosis/post-laminectomy syndrome. May have some facet components also  2. Lumbar radiculitis although I don't see signs of anything more substantial on exam as he has good strength, normal reflexes. 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  3. Tobacco abuse    Plan:  1. Continue meloxicam as an anti-inflammatory, but change to PRN  2. titrate gabapentin for radicular pain,   up to 600mg  TID  3. Will initiate ms contin for baseline pain control 30mg  q12 with oxycodone 15mg  for breakthrough pain, one q6 prn , #60 and #90 respectively 4. Continue with exercise program, modalities, good posture. pilates hand out was provided today. We reviewed at length the potential pathology of his pain and discussed the importance of increasing his core strength, length and flexibility.  Discussed the possibility of back injections, but at this point his pain is so diffuse and his MRI findings are relatively non-specific that I would be hard pressed to recommend an injection at a specific level. 5. Follow up with me in one month. 30 minutes of face to face patient care time were spent during this visit. All questions were encouraged and answered. Extensive time was spent educating the patient about the use of chronic opioids and the potential  long term effects. We also reviewed tobacco abuse.

## 2012-12-12 ENCOUNTER — Telehealth: Payer: Self-pay

## 2012-12-12 NOTE — Telephone Encounter (Signed)
Patient mother called with questions about medications.

## 2012-12-12 NOTE — Telephone Encounter (Signed)
Left message returning patients mothers call.

## 2013-01-09 ENCOUNTER — Telehealth: Payer: Self-pay | Admitting: *Deleted

## 2013-01-09 NOTE — Telephone Encounter (Signed)
Called about the appointment reminder for Monday at 1:45 that she received.  She said she had no such appointment recorded and to please cancel it.  I called her back and explained the appointment was for this Friday but she still requests it be canceled.

## 2013-01-10 ENCOUNTER — Encounter: Payer: Medicare Other | Admitting: Physical Medicine and Rehabilitation

## 2013-02-20 ENCOUNTER — Emergency Department (HOSPITAL_COMMUNITY)
Admission: EM | Admit: 2013-02-20 | Discharge: 2013-02-20 | Disposition: A | Discharge status: Home / Self Care | Payer: Medicare Other | Attending: Emergency Medicine | Admitting: Emergency Medicine

## 2013-02-20 ENCOUNTER — Emergency Department (HOSPITAL_COMMUNITY): Payer: Medicare Other

## 2013-02-20 ENCOUNTER — Encounter (HOSPITAL_COMMUNITY): Payer: Self-pay

## 2013-02-20 DIAGNOSIS — E860 Dehydration: Secondary | ICD-10-CM

## 2013-02-20 DIAGNOSIS — M545 Low back pain, unspecified: Secondary | ICD-10-CM | POA: Insufficient documentation

## 2013-02-20 DIAGNOSIS — R509 Fever, unspecified: Secondary | ICD-10-CM | POA: Insufficient documentation

## 2013-02-20 DIAGNOSIS — F17200 Nicotine dependence, unspecified, uncomplicated: Secondary | ICD-10-CM | POA: Insufficient documentation

## 2013-02-20 DIAGNOSIS — Z79899 Other long term (current) drug therapy: Secondary | ICD-10-CM | POA: Insufficient documentation

## 2013-02-20 DIAGNOSIS — R300 Dysuria: Secondary | ICD-10-CM | POA: Insufficient documentation

## 2013-02-20 DIAGNOSIS — R638 Other symptoms and signs concerning food and fluid intake: Secondary | ICD-10-CM | POA: Insufficient documentation

## 2013-02-20 DIAGNOSIS — R1084 Generalized abdominal pain: Secondary | ICD-10-CM | POA: Insufficient documentation

## 2013-02-20 DIAGNOSIS — Z859 Personal history of malignant neoplasm, unspecified: Secondary | ICD-10-CM | POA: Insufficient documentation

## 2013-02-20 DIAGNOSIS — G8929 Other chronic pain: Secondary | ICD-10-CM | POA: Insufficient documentation

## 2013-02-20 DIAGNOSIS — IMO0001 Reserved for inherently not codable concepts without codable children: Secondary | ICD-10-CM | POA: Insufficient documentation

## 2013-02-20 DIAGNOSIS — Z8669 Personal history of other diseases of the nervous system and sense organs: Secondary | ICD-10-CM | POA: Insufficient documentation

## 2013-02-20 LAB — BASIC METABOLIC PANEL
BUN: 12 mg/dL (ref 6–23)
CO2: 27 mEq/L (ref 19–32)
Chloride: 99 mEq/L (ref 96–112)
Creatinine, Ser: 1.06 mg/dL (ref 0.50–1.35)
Glucose, Bld: 100 mg/dL — ABNORMAL HIGH (ref 70–99)
Potassium: 4.2 mEq/L (ref 3.5–5.1)

## 2013-02-20 LAB — CBC WITH DIFFERENTIAL/PLATELET
Basophils Relative: 0 % (ref 0–1)
HCT: 41.2 % (ref 39.0–52.0)
Hemoglobin: 14.4 g/dL (ref 13.0–17.0)
Lymphs Abs: 1.3 10*3/uL (ref 0.7–4.0)
MCHC: 35 g/dL (ref 30.0–36.0)
Monocytes Absolute: 0.8 10*3/uL (ref 0.1–1.0)
Monocytes Relative: 6 % (ref 3–12)
Neutro Abs: 11.3 10*3/uL — ABNORMAL HIGH (ref 1.7–7.7)
Neutrophils Relative %: 84 % — ABNORMAL HIGH (ref 43–77)
RBC: 4.63 MIL/uL (ref 4.22–5.81)

## 2013-02-20 LAB — URINALYSIS, ROUTINE W REFLEX MICROSCOPIC
Bilirubin Urine: NEGATIVE
Glucose, UA: NEGATIVE mg/dL
Hgb urine dipstick: NEGATIVE
Ketones, ur: 15 mg/dL — AB
Specific Gravity, Urine: 1.01 (ref 1.005–1.030)
pH: 7 (ref 5.0–8.0)

## 2013-02-20 LAB — HEPATIC FUNCTION PANEL
ALT: 12 U/L (ref 0–53)
AST: 16 U/L (ref 0–37)
Albumin: 4.4 g/dL (ref 3.5–5.2)
Alkaline Phosphatase: 46 U/L (ref 39–117)
Bilirubin, Direct: 0.2 mg/dL (ref 0.0–0.3)
Total Bilirubin: 0.9 mg/dL (ref 0.3–1.2)

## 2013-02-20 MED ORDER — PROMETHAZINE HCL 25 MG RE SUPP: 25.0000 mg | Freq: Four times a day (QID) | RECTAL | Status: DC | PRN

## 2013-02-20 MED ORDER — ONDANSETRON HCL 4 MG/2ML IJ SOLN
4.0000 mg | Freq: Once | INTRAMUSCULAR | Status: AC
  Administered 2013-02-20: 4 mg via INTRAVENOUS
  Filled 2013-02-20: qty 2

## 2013-02-20 MED ORDER — SODIUM CHLORIDE 0.9 % IV BOLUS (SEPSIS)
1000.0000 mL | Freq: Once | INTRAVENOUS | Status: AC
  Administered 2013-02-20: 1000 mL via INTRAVENOUS

## 2013-02-20 NOTE — ED Provider Notes (Signed)
CSN: [id number]     Arrival date & time 02/20/13  7846 History   This chart was scribed for Joshua Lennert, MD, by Yevette Edwards, ED Scribe. This patient was seen in room APA07/APA07 and the patient's care was started at 9:42 AM. First MD Initiated Contact with Patient 02/20/13 0940     Chief Complaint  Patient presents with  . Emesis    Patient is a 62 y.o. male presenting with back pain and vomiting. The history is provided by the patient, a relative and the spouse. No language interpreter was used.  Back Pain Location:  Lumbar spine Pain severity:  Severe Pain is:  Same all the time Timing:  Constant Progression:  Unchanged Chronicity:  Chronic Relieved by:  Nothing Worsened by:  Nothing tried Associated symptoms: abdominal pain and fever   Associated symptoms: no chest pain and no headaches   Emesis Severity:  Moderate Duration:  2 weeks Timing:  Intermittent Progression:  Unchanged Chronicity:  New Relieved by:  Nothing Worsened by:  Nothing tried Ineffective treatments:  None tried Associated symptoms: abdominal pain and myalgias   Associated symptoms: no diarrhea and no headaches    HPI Comments: JANN RA is a 62 y.o. male, with a h/o DDD and bladder cancer,  who presents to the Emergency Department complaining of chronic back pain. The pt was taking 240/month of 30 mg oxycodone for chronic lower back pain. However, two weeks ago he was reduced to 120/month of 10 mg, and he remains in constant pain which interferes with his daily activities. Since the medication changes, he has experienced a diminished appetite, generalized abdominal pain, difficulty urinating, nausea, intermittent emesis, and a  mild fever which was measured in the ED at 99.9 F. In two weeks, he has an appointment to receive an epidural for the chronic back pain. The pt is attempting to schedule an appointment with a pain management clinic in [place]. He had his last appointment with his oncologist two  weeks ago.  Dr. Regino Schultze is the pt's PCP. Past Medical History  Diagnosis Date  . Cancer   . Back pain   . Meniere's disease    Past Surgical History  Procedure Laterality Date  . Back surgery    . Nasal sinus surgery    . Bladder surgery     No family history on file. History  Substance Use Topics  . Smoking status: Current Every Day Smoker  . Smokeless tobacco: Current User  . Alcohol Use: No    Review of Systems  Constitutional: Positive for fever, activity change and appetite change. Negative for fatigue.  HENT: Negative for congestion, sinus pressure and ear discharge.   Eyes: Negative for discharge.  Respiratory: Negative for cough.   Cardiovascular: Negative for chest pain.  Gastrointestinal: Positive for nausea, vomiting and abdominal pain. Negative for diarrhea.  Genitourinary: Negative for frequency and hematuria.  Musculoskeletal: Positive for myalgias and back pain.  Skin: Negative for rash.  Neurological: Negative for seizures and headaches.  Psychiatric/Behavioral: Negative for hallucinations.    Allergies  Review of patient's allergies indicates no known allergies.  Home Medications   Current Outpatient Rx  Name  Route  Sig  Dispense  Refill  . albuterol (PROVENTIL HFA;VENTOLIN HFA) 108 (90 BASE) MCG/ACT inhaler   Inhalation   Inhale 2 puffs into the lungs every 6 (six) hours as needed for wheezing or shortness of breath.          . diazepam (VALIUM) 10 MG  tablet   Oral   Take 10 mg by mouth every 6 (six) hours as needed for anxiety. anxiety         . ferrous sulfate 325 (65 FE) MG tablet   Oral   Take 325 mg by mouth 2 (two) times daily.         Marland Kitchen oxybutynin (DITROPAN-XL) 10 MG 24 hr tablet   Oral   Take 1 tablet by mouth daily.         Marland Kitchen oxycodone (ROXICODONE) 30 MG immediate release tablet   Oral   Take 30 mg by mouth every 4 (four) hours as needed for pain.         . promethazine (PHENERGAN) 12.5 MG suppository   Rectal    Place 12.5 mg rectally every 6 (six) hours as needed for nausea.         . tamsulosin (FLOMAX) 0.4 MG CAPS   Oral   Take 1 capsule by mouth at bedtime.         . theophylline (THEODUR) 200 MG 12 hr tablet   Oral   Take 1 tablet by mouth daily.          Triage Vitals: BP 120/84  Pulse 83  Temp(Src) 99.9 F (37.7 C) (Oral)  Resp 18  Ht 6' (1.829 m)  Wt 162 lb (73.483 kg)  BMI 21.97 kg/m2  SpO2 97%  Physical Exam  Nursing note and vitals reviewed. Constitutional: He is oriented to person, place, and time. He appears well-developed.  HENT:  Head: Normocephalic.  Dry MM.  Eyes: Conjunctivae and EOM are normal. No scleral icterus.  Neck: Neck supple. No thyromegaly present.  Cardiovascular: Normal rate and regular rhythm.  Exam reveals no gallop and no friction rub.   No murmur heard. Pulmonary/Chest: No stridor. He has wheezes. He has no rales. He exhibits no tenderness.  Mild bilateral wheezing.   Abdominal: He exhibits no distension. There is tenderness. There is no rebound.  Musculoskeletal: Normal range of motion. He exhibits tenderness. He exhibits no edema.  Mild tenderness to lumbar spine.   Lymphadenopathy:    He has no cervical adenopathy.  Neurological: He is oriented to person, place, and time. Coordination normal.  Skin: No rash noted. No erythema.  Psychiatric: He has a normal mood and affect. His behavior is normal.    ED Course  Procedures (including critical care time)  DIAGNOSTIC STUDIES: Oxygen Saturation is 97% on room air, normal by my interpretation.    COORDINATION OF CARE:  9:54 AM- Discussed treatment plan with patient, and the patient agreed to the plan.   Labs Review Labs Reviewed  CBC WITH DIFFERENTIAL - Abnormal; Notable for the following:    WBC 13.5 (*)    Neutrophils Relative % 84 (*)    Neutro Abs 11.3 (*)    Lymphocytes Relative 10 (*)    All other components within normal limits  BASIC METABOLIC PANEL - Abnormal; Notable  for the following:    Glucose, Bld 100 (*)    GFR calc non Af Amer 74 (*)    GFR calc Af Amer 86 (*)    All other components within normal limits  HEPATIC FUNCTION PANEL  LIPASE, BLOOD  URINALYSIS, ROUTINE W REFLEX MICROSCOPIC   Imaging Review Dg Abd Acute W/chest  02/20/2013   CLINICAL DATA:  Pain.  EXAM: ACUTE ABDOMEN SERIES (ABDOMEN 2 VIEW & CHEST 1 VIEW)  COMPARISON:  CHEST x-ray 08/27/2009  FINDINGS: Mild hyperinflation. Biapical scarring. These  findings are stable since prior study. Heart is normal size. Septal defect repair device noted projecting over the heart, stable. No effusions.  There is normal bowel gas pattern. No free air. No organomegaly or suspicious calcification. No acute bony abnormality.  IMPRESSION: No evidence of bowel obstruction or free air.  Mild hyperinflation/chronic changes within the lungs. No acute findings.   Electronically Signed   By: Charlett Nose M.D.   On: 02/20/2013 10:45    MDM  No diagnosis found.   The chart was scribed for me under my direct supervision.  I personally performed the history, physical, and medical decision making and all procedures in the evaluation of this patient.Joshua Lennert, MD 02/20/13 1426

## 2013-02-20 NOTE — ED Notes (Signed)
Pt foley when placed by tech L. Nolen Mu only drained 100cc per. Eustaquio Maize

## 2013-02-20 NOTE — ED Notes (Addendum)
Pt reports takes oxycodone for back pain but ran out 17 days ago.  Reports was referred to [facility] pain management but they never got back in touch with the pt.  Pt went to Dr. Loyal Gambler office today and sent here for evaluation.  Sister reports pt has been vomiting ever since running out of medication.  Pt very weak.  Reports patient is very HOH and has to read lips.   Dr. Regino Schultze gave pt prescription for oxycodone 30mg  120 tabs this morning.  Pt had one approx ago.

## 2013-02-20 NOTE — ED Notes (Signed)
nad noted prior to dc. Dc instructions reviewed with pt. 1 script given to pt. Voiced understanding.

## 2013-03-27 ENCOUNTER — Ambulatory Visit (HOSPITAL_COMMUNITY)
Admission: RE | Admit: 2013-03-27 | Discharge: 2013-03-27 | Disposition: A | Discharge status: Home / Self Care | Payer: Medicare Other | Source: Ambulatory Visit | Attending: Internal Medicine | Admitting: Internal Medicine

## 2013-03-27 ENCOUNTER — Other Ambulatory Visit (HOSPITAL_COMMUNITY): Payer: Self-pay | Admitting: Internal Medicine

## 2013-03-27 DIAGNOSIS — R059 Cough, unspecified: Secondary | ICD-10-CM

## 2013-03-27 DIAGNOSIS — R319 Hematuria, unspecified: Secondary | ICD-10-CM

## 2013-03-27 DIAGNOSIS — R05 Cough: Secondary | ICD-10-CM

## 2013-03-27 DIAGNOSIS — Z8551 Personal history of malignant neoplasm of bladder: Secondary | ICD-10-CM

## 2013-03-27 DIAGNOSIS — R634 Abnormal weight loss: Secondary | ICD-10-CM

## 2013-03-31 ENCOUNTER — Other Ambulatory Visit (HOSPITAL_COMMUNITY): Payer: Self-pay | Admitting: Internal Medicine

## 2013-03-31 DIAGNOSIS — Z8551 Personal history of malignant neoplasm of bladder: Secondary | ICD-10-CM

## 2013-03-31 DIAGNOSIS — R634 Abnormal weight loss: Secondary | ICD-10-CM

## 2013-04-02 ENCOUNTER — Other Ambulatory Visit (HOSPITAL_COMMUNITY): Payer: Medicare Other

## 2013-04-11 ENCOUNTER — Other Ambulatory Visit (HOSPITAL_COMMUNITY): Payer: Self-pay | Admitting: Internal Medicine

## 2013-04-11 ENCOUNTER — Ambulatory Visit (HOSPITAL_COMMUNITY)
Admission: RE | Admit: 2013-04-11 | Discharge: 2013-04-11 | Disposition: A | Discharge status: Home / Self Care | Payer: Medicare Other | Source: Ambulatory Visit | Attending: Internal Medicine | Admitting: Internal Medicine

## 2013-04-11 DIAGNOSIS — N1330 Unspecified hydronephrosis: Secondary | ICD-10-CM | POA: Insufficient documentation

## 2013-04-11 DIAGNOSIS — Z8551 Personal history of malignant neoplasm of bladder: Secondary | ICD-10-CM

## 2013-04-11 DIAGNOSIS — R319 Hematuria, unspecified: Secondary | ICD-10-CM

## 2013-04-11 DIAGNOSIS — R634 Abnormal weight loss: Secondary | ICD-10-CM

## 2013-04-11 DIAGNOSIS — I7090 Unspecified atherosclerosis: Secondary | ICD-10-CM | POA: Insufficient documentation

## 2013-04-11 DIAGNOSIS — Q619 Cystic kidney disease, unspecified: Secondary | ICD-10-CM | POA: Insufficient documentation

## 2013-04-11 MED ORDER — IOHEXOL 300 MG/ML  SOLN
125.0000 mL | Freq: Once | INTRAMUSCULAR | Status: AC | PRN
  Administered 2013-04-11: 125 mL via INTRAVENOUS

## 2013-04-11 MED ORDER — SODIUM CHLORIDE 0.9 % IV SOLN
INTRAVENOUS | Status: AC
  Filled 2013-04-11: qty 250

## 2013-04-14 ENCOUNTER — Encounter: Payer: Self-pay | Admitting: Endocrinology

## 2013-04-14 ENCOUNTER — Ambulatory Visit (INDEPENDENT_AMBULATORY_CARE_PROVIDER_SITE_OTHER): Payer: Medicare Other | Admitting: Endocrinology

## 2013-04-14 VITALS — BP 118/68 | HR 97 | Temp 98.6°F | Resp 12 | Ht 72.0 in | Wt 169.7 lb

## 2013-04-14 DIAGNOSIS — E279 Disorder of adrenal gland, unspecified: Secondary | ICD-10-CM | POA: Insufficient documentation

## 2013-04-14 MED ORDER — COSYNTROPIN 0.25 MG IJ SOLR
0.2500 mg | Freq: Once | INTRAMUSCULAR | Status: AC
  Administered 2013-04-14: 0.25 mg via INTRAMUSCULAR

## 2013-04-14 NOTE — Patient Instructions (Addendum)
blood tests are being requested for you today.  We'll contact you with results.  

## 2013-04-14 NOTE — Progress Notes (Signed)
Subjective:    Patient ID: Joshua [name], male    DOB: Dec 03, 1950, 62 y.o.   MRN: [id number]  HPI Pt states 2 years of severe pain at the lower back, and assoc weight loss.  Last spinal injection was 2 weeks ago.  He had another in September. Past Medical History  Diagnosis Date  . Cancer   . Back pain   . Meniere's disease     Past Surgical History  Procedure Laterality Date  . Back surgery    . Nasal sinus surgery    . Bladder surgery      History   Social History  . Marital Status: Single    Spouse Name: N/A    Number of Children: N/A  . Years of Education: N/A   Occupational History  . Not on file.   Social History Main Topics  . Smoking status: Current Every Day Smoker  . Smokeless tobacco: Current User  . Alcohol Use: No  . Drug Use: No  . Sexual Activity: Not on file   Other Topics Concern  . Not on file   Social History Narrative  . No narrative on file    Current Outpatient Prescriptions on File Prior to Visit  Medication Sig Dispense Refill  . albuterol (PROVENTIL HFA;VENTOLIN HFA) 108 (90 BASE) MCG/ACT inhaler Inhale 2 puffs into the lungs every 6 (six) hours as needed for wheezing or shortness of breath.       . diazepam (VALIUM) 10 MG tablet Take 10 mg by mouth every 6 (six) hours as needed for anxiety. anxiety      . ferrous sulfate 325 (65 FE) MG tablet Take 325 mg by mouth 2 (two) times daily.      Marland Kitchen oxybutynin (DITROPAN-XL) 10 MG 24 hr tablet Take 1 tablet by mouth daily.      Marland Kitchen oxycodone (ROXICODONE) 30 MG immediate release tablet Take 30 mg by mouth every 4 (four) hours as needed for pain.      . promethazine (PHENERGAN) 12.5 MG suppository Place 12.5 mg rectally every 6 (six) hours as needed for nausea.      . promethazine (PHENERGAN) 25 MG suppository Place 1 suppository (25 mg total) rectally every 6 (six) hours as needed for nausea.  12 each  1  . tamsulosin (FLOMAX) 0.4 MG CAPS Take 1 capsule by mouth at bedtime.      . theophylline  (THEODUR) 200 MG 12 hr tablet Take 1 tablet by mouth daily.       No current facility-administered medications on file prior to visit.   No Known Allergies  No family history on file. Neg for adrenal problems BP 118/68  Pulse 97  Temp(Src) 98.6 F (37 C)  Resp 12  Ht 6' (1.829 m)  Wt 169 lb 11.2 oz (76.975 kg)  BMI 23.01 kg/m2  SpO2 98%  Review of Systems denies polyuria, syncope, rash, visual loss, gynecomastia, fever, change in facial appearance, and n/v.  He has cold intolerance, depression, headache, easy bruising, darkening of the skin, allergy sxs, and diffuse itching.  He has chronic hearing loss.    Objective:   Physical Exam VS: see vs page GEN: no distress HEAD: head: no deformity eyes: no periorbital swelling, no proptosis external nose and ears are normal mouth: no lesion seen Ears: hearing is poor bilaterally NECK: supple, thyroid is not enlarged CHEST WALL: no deformity LUNGS:  Clear to auscultation CV: reg rate and rhythm, no murmur ABD: abdomen is soft, nontender.  no hepatosplenomegaly.  not distended.  no hernia MUSCULOSKELETAL: muscle bulk and strength are grossly normal.  no obvious joint swelling.  gait is slow but steady.  He walks with the back flexed.   EXTEMITIES: no deformity.  no ulcer on the feet.  feet are of normal color and temp.  no edema PULSES: dorsalis pedis intact bilat.  no carotid bruit NEURO:  cn 2-12 grossly intact.   readily moves all 4's.  sensation is intact to touch on the feet SKIN:  Normal texture and temperature.  No rash or suspicious lesion is visible.  Normal skin tone. NODES:  None palpable at the neck PSYCH: alert, oriented x3.  Does not appear anxious nor depressed.    acth stimulation test is done: baseline cortisol level=4 then cosyntropin 250 mcg is given im 45 minutes later, cortisol level=19 (normal response)     Assessment & Plan:  Weight loss: not adrenal-related Low-back pain: it is possible that the  steroid injections are affecting cortisol levels, but this is still a normal response. Depression: this limits interpretation of sxs

## 2013-11-11 ENCOUNTER — Emergency Department (HOSPITAL_COMMUNITY)
Admission: EM | Admit: 2013-11-11 | Discharge: 2013-11-11 | Disposition: A | Discharge status: Home / Self Care | Payer: Medicare Other | Attending: Emergency Medicine | Admitting: Emergency Medicine

## 2013-11-11 ENCOUNTER — Encounter (HOSPITAL_COMMUNITY): Payer: Self-pay | Admitting: Emergency Medicine

## 2013-11-11 DIAGNOSIS — Z8669 Personal history of other diseases of the nervous system and sense organs: Secondary | ICD-10-CM | POA: Insufficient documentation

## 2013-11-11 DIAGNOSIS — H9190 Unspecified hearing loss, unspecified ear: Secondary | ICD-10-CM | POA: Insufficient documentation

## 2013-11-11 DIAGNOSIS — M549 Dorsalgia, unspecified: Secondary | ICD-10-CM | POA: Insufficient documentation

## 2013-11-11 DIAGNOSIS — S61511A Laceration without foreign body of right wrist, initial encounter: Secondary | ICD-10-CM

## 2013-11-11 DIAGNOSIS — Y929 Unspecified place or not applicable: Secondary | ICD-10-CM | POA: Insufficient documentation

## 2013-11-11 DIAGNOSIS — Z23 Encounter for immunization: Secondary | ICD-10-CM | POA: Insufficient documentation

## 2013-11-11 DIAGNOSIS — F17200 Nicotine dependence, unspecified, uncomplicated: Secondary | ICD-10-CM | POA: Insufficient documentation

## 2013-11-11 DIAGNOSIS — S61509A Unspecified open wound of unspecified wrist, initial encounter: Secondary | ICD-10-CM | POA: Insufficient documentation

## 2013-11-11 DIAGNOSIS — Y9389 Activity, other specified: Secondary | ICD-10-CM | POA: Insufficient documentation

## 2013-11-11 DIAGNOSIS — Z79899 Other long term (current) drug therapy: Secondary | ICD-10-CM | POA: Insufficient documentation

## 2013-11-11 DIAGNOSIS — W278XXA Contact with other nonpowered hand tool, initial encounter: Secondary | ICD-10-CM | POA: Insufficient documentation

## 2013-11-11 DIAGNOSIS — Z859 Personal history of malignant neoplasm, unspecified: Secondary | ICD-10-CM | POA: Insufficient documentation

## 2013-11-11 MED ORDER — LIDOCAINE HCL (PF) 1 % IJ SOLN
INTRAMUSCULAR | Status: AC
  Administered 2013-11-11: 20:00:00
  Filled 2013-11-11: qty 5

## 2013-11-11 MED ORDER — TETANUS-DIPHTH-ACELL PERTUSSIS 5-2.5-18.5 LF-MCG/0.5 IM SUSP
0.5000 mL | Freq: Once | INTRAMUSCULAR | Status: AC
  Administered 2013-11-11: 0.5 mL via INTRAMUSCULAR
  Filled 2013-11-11: qty 0.5

## 2013-11-11 NOTE — ED Notes (Signed)
Pt with inch lac noted to right lat wrist, bleeding controlled

## 2013-11-11 NOTE — ED Provider Notes (Signed)
CSN: [id number]     Arrival date & time 11/11/13  1843 History   First MD Initiated Contact with Patient 11/11/13 1923     Chief Complaint  Patient presents with  . Laceration     (Consider location/radiation/quality/duration/timing/severity/associated sxs/prior Treatment) Patient is a 63 y.o. male presenting with skin laceration. The history is provided by the patient and a relative.  Laceration Location:  Shoulder/arm Shoulder/arm laceration location:  R wrist Length (cm):  1.3 Quality: straight   Bleeding: controlled   Time since incident:  40 minutes Laceration mechanism:  Metal edge Pain details:    Quality:  Aching   Severity:  Mild   Timing:  Intermittent   Progression:  Worsening Foreign body present:  No foreign bodies Relieved by:  Nothing Worsened by:  Nothing tried Tetanus status:  Out of date   Past Medical History  Diagnosis Date  . Back pain   . Meniere's disease   . Cancer    Past Surgical History  Procedure Laterality Date  . Back surgery    . Nasal sinus surgery    . Bladder surgery    . Hard of hearing     History reviewed. No pertinent family history. History  Substance Use Topics  . Smoking status: Current Every Day Smoker  . Smokeless tobacco: Current User  . Alcohol Use: No    Review of Systems  Constitutional: Negative for activity change.       All ROS Neg except as noted in HPI  HENT: Positive for hearing loss. Negative for nosebleeds.   Eyes: Negative for photophobia and discharge.  Respiratory: Negative for cough, shortness of breath and wheezing.   Cardiovascular: Negative for chest pain and palpitations.  Gastrointestinal: Negative for abdominal pain and blood in stool.  Genitourinary: Negative for dysuria, frequency and hematuria.  Musculoskeletal: Positive for back pain. Negative for arthralgias and neck pain.  Skin: Negative.   Neurological: Negative for dizziness, seizures and speech difficulty.  Psychiatric/Behavioral:  Negative for hallucinations and confusion.      Allergies  Review of patient's allergies indicates no known allergies.  Home Medications   Prior to Admission medications   Medication Sig Start Date End Date Taking? Authorizing Provider  albuterol (PROVENTIL HFA;VENTOLIN HFA) 108 (90 BASE) MCG/ACT inhaler Inhale 2 puffs into the lungs every 6 (six) hours as needed for wheezing or shortness of breath.     Historical Provider, MD  diazepam (VALIUM) 10 MG tablet Take 10 mg by mouth every 6 (six) hours as needed for anxiety. anxiety    Historical Provider, MD  ferrous sulfate 325 (65 FE) MG tablet Take 325 mg by mouth 2 (two) times daily.    Historical Provider, MD  griseofulvin (GRIS-PEG) 250 MG tablet  03/28/13   Historical Provider, MD  oxybutynin (DITROPAN-XL) 10 MG 24 hr tablet Take 1 tablet by mouth daily. 11/11/12   Redmond School, MD  oxycodone (ROXICODONE) 30 MG immediate release tablet Take 30 mg by mouth every 4 (four) hours as needed for pain.    Historical Provider, MD  oxyCODONE-acetaminophen (PERCOCET) 10-325 MG per tablet  02/03/13   Historical Provider, MD  pantoprazole (PROTONIX) 40 MG tablet  03/28/13   Historical Provider, MD  promethazine (PHENERGAN) 12.5 MG suppository Place 12.5 mg rectally every 6 (six) hours as needed for nausea.    Historical Provider, MD  promethazine (PHENERGAN) 25 MG suppository Place 1 suppository (25 mg total) rectally every 6 (six) hours as needed for nausea. 02/20/13  Maudry Diego, MD  tamsulosin (FLOMAX) 0.4 MG CAPS Take 1 capsule by mouth at bedtime. 11/11/12   Bernestine Amass, MD  theophylline (THEODUR) 200 MG 12 hr tablet Take 1 tablet by mouth daily. 11/11/12   Redmond School, MD   BP 92/74  Pulse 85  Temp(Src) 98.1 F (36.7 C) (Oral)  Resp 18  Ht 6' (1.829 m)  Wt 170 lb (77.111 kg)  BMI 23.05 kg/m2  SpO2 95% Physical Exam  Nursing note and vitals reviewed. Constitutional: He is oriented to person, place, and time. He appears  well-developed and well-nourished.  Non-toxic appearance.  HENT:  Head: Normocephalic.  Right Ear: Tympanic membrane and external ear normal.  Left Ear: Tympanic membrane and external ear normal.  Eyes: EOM and lids are normal. Pupils are equal, round, and reactive to light.  Neck: Normal range of motion. Neck supple. Carotid bruit is not present.  Cardiovascular: Normal rate, regular rhythm, normal heart sounds, intact distal pulses and normal pulses.   Pulmonary/Chest: Breath sounds normal. No respiratory distress.  Abdominal: Soft. Bowel sounds are normal. There is no tenderness. There is no guarding.  Musculoskeletal: Normal range of motion.       Arms: Lymphadenopathy:       Head (right side): No submandibular adenopathy present.       Head (left side): No submandibular adenopathy present.    He has no cervical adenopathy.  Neurological: He is alert and oriented to person, place, and time. He has normal strength. No cranial nerve deficit or sensory deficit.  Skin: Skin is warm and dry.  Psychiatric: He has a normal mood and affect. His speech is normal.    ED Course  LACERATION REPAIR Date/Time: 11/11/2013 7:30 PM Performed by: Lenox Ahr Authorized by: Lenox Ahr Consent: Verbal consent obtained. Risks and benefits: risks, benefits and alternatives were discussed Consent given by: patient Patient understanding: patient states understanding of the procedure being performed Patient identity confirmed: arm band Time out: Immediately prior to procedure a "time out" was called to verify the correct patient, procedure, equipment, support staff and site/side marked as required. Body area: upper extremity Location details: right wrist Laceration length: 1.3 cm Foreign bodies: no foreign bodies Tendon involvement: none Nerve involvement: none Vascular damage: no Anesthesia: local infiltration Local anesthetic: lidocaine 1% without epinephrine Patient sedated:  no Preparation: Patient was prepped and draped in the usual sterile fashion. Irrigation solution: saline Amount of cleaning: standard Debridement: none Degree of undermining: none Skin closure: 4-0 nylon Number of sutures: 3 Technique: simple Approximation: close Approximation difficulty: simple Dressing: 4x4 sterile gauze Patient tolerance: Patient tolerated the procedure well with no immediate complications.   (including critical care time) Labs Review Labs Reviewed - No data to display  Imaging Review No results found.   EKG Interpretation None      MDM Patient sustained a laceration to the right wrist area while working with pruning shears. The laceration was irrigated and repaired with 3 interrupted sutures of 4-0 nylon. Patient advised to keep the wound clean and dry and to have the sutures removed in 7 days. He will return sooner if any changes or signs of infection.    Final diagnoses:  Laceration of wrist, right    **I have reviewed nursing notes, vital signs, and all appropriate lab and imaging results for this patient.Lenox Ahr, PA-C 11/11/13 1942

## 2013-11-11 NOTE — Discharge Instructions (Signed)
Your wound was repaired with 3 sutures. Please keep this area clean and dry. Please have the sutures removed in 7 days. Please see your Dr. or return to the emergency department if any unusual redness, drainage, red streaking, or signs of infection. Stitches, Staples, or Skin Adhesive Strips  Stitches (sutures), staples, and skin adhesive strips hold the skin together as it heals. They will usually be in place for 7 days or less. HOME CARE  Wash your hands with soap and water before and after you touch your wound.  Only take medicine as told by your doctor.  Cover your wound only if your doctor told you to. Otherwise, leave it open to air.  Do not get your stitches wet or dirty. If they get dirty, dab them gently with a clean washcloth. Wet the washcloth with soapy water. Do not rub. Pat them dry gently.  Do not put medicine or medicated cream on your stitches unless your doctor told you to.  Do not take out your own stitches or staples. Skin adhesive strips will fall off by themselves.  Do not pick at the wound. Picking can cause an infection.  Do not miss your follow-up appointment.  If you have problems or questions, call your doctor. GET HELP RIGHT AWAY IF:   You have a temperature by mouth above 102 F (38.9 C), not controlled by medicine.  You have chills.  You have redness or pain around your stitches.  There is puffiness (swelling) around your stitches.  You notice fluid (drainage) from your stitches.  There is a bad smell coming from your wound. MAKE SURE YOU:  Understand these instructions.  Will watch your condition.  Will get help if you are not doing well or get worse. Document Released: 03/26/2009 Document Revised: 08/21/2011 Document Reviewed: 03/26/2009 [facility] Patient Information 2014 [place], [place].

## 2013-11-11 NOTE — ED Notes (Signed)
Lac to rt hand, cut with pruning shears.

## 2013-11-11 NOTE — ED Provider Notes (Signed)
Medical screening examination/treatment/procedure(s) were performed by non-physician practitioner and as supervising physician I was immediately available for consultation/collaboration.     Liona Wengert, MD 11/11/13 2115 

## 2014-04-13 ENCOUNTER — Emergency Department (HOSPITAL_COMMUNITY)
Admission: EM | Admit: 2014-04-13 | Discharge: 2014-04-13 | Disposition: A | Discharge status: Home / Self Care | Payer: Medicare Other | Attending: Emergency Medicine | Admitting: Emergency Medicine

## 2014-04-13 ENCOUNTER — Encounter (HOSPITAL_COMMUNITY): Payer: Self-pay | Admitting: *Deleted

## 2014-04-13 DIAGNOSIS — W228XXA Striking against or struck by other objects, initial encounter: Secondary | ICD-10-CM | POA: Insufficient documentation

## 2014-04-13 DIAGNOSIS — Z859 Personal history of malignant neoplasm, unspecified: Secondary | ICD-10-CM | POA: Diagnosis not present

## 2014-04-13 DIAGNOSIS — Y9389 Activity, other specified: Secondary | ICD-10-CM | POA: Insufficient documentation

## 2014-04-13 DIAGNOSIS — Z720 Tobacco use: Secondary | ICD-10-CM | POA: Diagnosis not present

## 2014-04-13 DIAGNOSIS — Z79899 Other long term (current) drug therapy: Secondary | ICD-10-CM | POA: Insufficient documentation

## 2014-04-13 DIAGNOSIS — S01511A Laceration without foreign body of lip, initial encounter: Secondary | ICD-10-CM | POA: Diagnosis not present

## 2014-04-13 DIAGNOSIS — Y929 Unspecified place or not applicable: Secondary | ICD-10-CM | POA: Insufficient documentation

## 2014-04-13 DIAGNOSIS — Z8669 Personal history of other diseases of the nervous system and sense organs: Secondary | ICD-10-CM | POA: Diagnosis not present

## 2014-04-13 MED ORDER — LIDOCAINE HCL (PF) 1 % IJ SOLN
INTRAMUSCULAR | Status: AC
  Filled 2014-04-13: qty 5

## 2014-04-13 MED ORDER — POVIDONE-IODINE 10 % EX SOLN
CUTANEOUS | Status: AC
  Filled 2014-04-13: qty 118

## 2014-04-13 MED ORDER — LIDOCAINE HCL (PF) 2 % IJ SOLN
INTRAMUSCULAR | Status: AC
  Filled 2014-04-13: qty 10
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
## 2014-04-13 NOTE — ED Notes (Signed)
Lac to  Upper and lower lip , struck against against car door.

## 2014-04-13 NOTE — Discharge Instructions (Signed)
Please have the sutures of the upper lip removed in 5 days. The sutures in the lower lip will dissolve on their own. Please see your doctor, or return to the emergency department if any puslike drainage, red streaks of the face, or signs of severe infection. Stitches, Staples, or Skin Adhesive Strips  Stitches (sutures), staples, and skin adhesive strips hold the skin together as it heals. They will usually be in place for 7 days or less. HOME CARE  Wash your hands with soap and water before and after you touch your wound.  Only take medicine as told by your doctor.  Cover your wound only if your doctor told you to. Otherwise, leave it open to air.  Do not get your stitches wet or dirty. If they get dirty, dab them gently with a clean washcloth. Wet the washcloth with soapy water. Do not rub. Pat them dry gently.  Do not put medicine or medicated cream on your stitches unless your doctor told you to.  Do not take out your own stitches or staples. Skin adhesive strips will fall off by themselves.  Do not pick at the wound. Picking can cause an infection.  Do not miss your follow-up appointment.  If you have problems or questions, call your doctor. GET HELP RIGHT AWAY IF:   You have a temperature by mouth above 102 F (38.9 C), not controlled by medicine.  You have chills.  You have redness or pain around your stitches.  There is puffiness (swelling) around your stitches.  You notice fluid (drainage) from your stitches.  There is a bad smell coming from your wound. MAKE SURE YOU:  Understand these instructions.  Will watch your condition.  Will get help if you are not doing well or get worse. Document Released: 03/26/2009 Document Revised: 08/21/2011 Document Reviewed: 03/26/2009 [facility] Patient Information 2015 [place], [place]. This information is not intended to replace advice given to you by your health care provider. Make sure you discuss any questions you have with  your health care provider.

## 2015-05-27 ENCOUNTER — Encounter (INDEPENDENT_AMBULATORY_CARE_PROVIDER_SITE_OTHER): Payer: Self-pay | Admitting: *Deleted

## 2015-10-02 ENCOUNTER — Emergency Department (HOSPITAL_COMMUNITY): Payer: Medicare Other

## 2015-10-02 ENCOUNTER — Observation Stay (HOSPITAL_COMMUNITY)
Admission: EM | Admit: 2015-10-02 | Discharge: 2015-10-03 | Disposition: A | Discharge status: Home / Self Care | Payer: Medicare Other | Attending: Internal Medicine | Admitting: Internal Medicine

## 2015-10-02 ENCOUNTER — Encounter (HOSPITAL_COMMUNITY): Payer: Self-pay | Admitting: Emergency Medicine

## 2015-10-02 DIAGNOSIS — T402X5A Adverse effect of other opioids, initial encounter: Secondary | ICD-10-CM | POA: Diagnosis present

## 2015-10-02 DIAGNOSIS — N189 Chronic kidney disease, unspecified: Secondary | ICD-10-CM | POA: Diagnosis not present

## 2015-10-02 DIAGNOSIS — R400 Somnolence: Secondary | ICD-10-CM | POA: Diagnosis present

## 2015-10-02 DIAGNOSIS — G8929 Other chronic pain: Secondary | ICD-10-CM | POA: Diagnosis present

## 2015-10-02 DIAGNOSIS — Z79899 Other long term (current) drug therapy: Secondary | ICD-10-CM

## 2015-10-02 DIAGNOSIS — Z720 Tobacco use: Secondary | ICD-10-CM | POA: Diagnosis present

## 2015-10-02 DIAGNOSIS — J449 Chronic obstructive pulmonary disease, unspecified: Secondary | ICD-10-CM | POA: Diagnosis not present

## 2015-10-02 DIAGNOSIS — F17210 Nicotine dependence, cigarettes, uncomplicated: Secondary | ICD-10-CM | POA: Insufficient documentation

## 2015-10-02 DIAGNOSIS — R4182 Altered mental status, unspecified: Secondary | ICD-10-CM | POA: Diagnosis not present

## 2015-10-02 DIAGNOSIS — R001 Bradycardia, unspecified: Secondary | ICD-10-CM | POA: Diagnosis present

## 2015-10-02 DIAGNOSIS — N289 Disorder of kidney and ureter, unspecified: Secondary | ICD-10-CM

## 2015-10-02 HISTORY — DX: Other chronic pain: G89.29

## 2015-10-02 HISTORY — DX: Chronic obstructive pulmonary disease, unspecified: J44.9

## 2015-10-02 LAB — CBC WITH DIFFERENTIAL/PLATELET
BASOS ABS: 0 10*3/uL (ref 0.0–0.1)
Basophils Relative: 0 %
EOS PCT: 0 %
Eosinophils Absolute: 0 10*3/uL (ref 0.0–0.7)
HEMATOCRIT: 40.7 % (ref 39.0–52.0)
Hemoglobin: 13.9 g/dL (ref 13.0–17.0)
LYMPHS PCT: 11 %
Lymphs Abs: 1 10*3/uL (ref 0.7–4.0)
MCH: 29.3 pg (ref 26.0–34.0)
MCHC: 34.2 g/dL (ref 30.0–36.0)
MCV: 85.9 fL (ref 78.0–100.0)
MONO ABS: 0.9 10*3/uL (ref 0.1–1.0)
MONOS PCT: 11 %
NEUTROS ABS: 6.8 10*3/uL (ref 1.7–7.7)
Neutrophils Relative %: 78 %
PLATELETS: 308 10*3/uL (ref 150–400)
RBC: 4.74 MIL/uL (ref 4.22–5.81)
RDW: 13 % (ref 11.5–15.5)
WBC: 8.7 10*3/uL (ref 4.0–10.5)

## 2015-10-02 LAB — COMPREHENSIVE METABOLIC PANEL
ALBUMIN: 3.2 g/dL — AB (ref 3.5–5.0)
ALT: 13 U/L — ABNORMAL LOW (ref 17–63)
ANION GAP: 9 (ref 5–15)
AST: 15 U/L (ref 15–41)
Alkaline Phosphatase: 39 U/L (ref 38–126)
BILIRUBIN TOTAL: 0.5 mg/dL (ref 0.3–1.2)
BUN: 12 mg/dL (ref 6–20)
CALCIUM: 8.6 mg/dL — AB (ref 8.9–10.3)
CHLORIDE: 110 mmol/L (ref 101–111)
CO2: 21 mmol/L — AB (ref 22–32)
Creatinine, Ser: 0.84 mg/dL (ref 0.61–1.24)
GFR calc non Af Amer: >60 mL/min (ref >60)
Glucose, Bld: 135 mg/dL — ABNORMAL HIGH (ref 65–99)
Potassium: 3.5 mmol/L (ref 3.5–5.1)
SODIUM: 140 mmol/L (ref 135–145)
Total Protein: 6.5 g/dL (ref 6.5–8.1)

## 2015-10-02 LAB — PROTIME-INR
INR: 1.02 (ref 0.00–1.49)
Prothrombin Time: 13.6 seconds (ref 11.6–15.2)

## 2015-10-02 LAB — URINALYSIS, ROUTINE W REFLEX MICROSCOPIC
BILIRUBIN URINE: NEGATIVE
Glucose, UA: NEGATIVE mg/dL
Hgb urine dipstick: NEGATIVE
KETONES UR: NEGATIVE mg/dL
LEUKOCYTES UA: NEGATIVE
NITRITE: NEGATIVE
PH: 8 (ref 5.0–8.0)
PROTEIN: NEGATIVE mg/dL
Specific Gravity, Urine: 1.01 (ref 1.005–1.030)

## 2015-10-02 LAB — BLOOD GAS, ARTERIAL
ACID-BASE DEFICIT: 1.7 mmol/L (ref 0.0–2.0)
BICARBONATE: 23.8 meq/L (ref 20.0–24.0)
DRAWN BY: 105551
FIO2: 0.21
O2 SAT: 96.6 %
PH ART: 7.494 — AB (ref 7.350–7.450)
pCO2 arterial: 27.8 mmHg — ABNORMAL LOW (ref 35.0–45.0)
pO2, Arterial: 86.7 mmHg (ref 80.0–100.0)

## 2015-10-02 LAB — RAPID URINE DRUG SCREEN, HOSP PERFORMED
AMPHETAMINES: NOT DETECTED
BARBITURATES: NOT DETECTED
Benzodiazepines: POSITIVE — AB
Cocaine: NOT DETECTED
OPIATES: POSITIVE — AB
TETRAHYDROCANNABINOL: NOT DETECTED

## 2015-10-02 LAB — TROPONIN I: Troponin I: <0.03 ng/mL (ref <0.031)

## 2015-10-02 LAB — LACTIC ACID, PLASMA: LACTIC ACID, VENOUS: 0.8 mmol/L (ref 0.5–2.0)

## 2015-10-02 LAB — ETHANOL

## 2015-10-02 LAB — LIPASE, BLOOD: Lipase: 23 U/L (ref 11–51)

## 2015-10-02 LAB — SALICYLATE LEVEL: Salicylate Lvl: <4 mg/dL (ref 2.8–30.0)

## 2015-10-02 LAB — ACETAMINOPHEN LEVEL

## 2015-10-02 MED ORDER — SODIUM CHLORIDE 0.9 % IV BOLUS (SEPSIS)
500.0000 mL | Freq: Once | INTRAVENOUS | Status: AC
  Administered 2015-10-02: 500 mL via INTRAVENOUS

## 2015-10-02 MED ORDER — NALOXONE HCL 0.4 MG/ML IJ SOLN
0.4000 mg | Freq: Once | INTRAMUSCULAR | Status: AC
  Administered 2015-10-02: 0.4 mg via INTRAVENOUS
  Filled 2015-10-02: qty 1

## 2015-10-02 MED ORDER — SODIUM CHLORIDE 0.9 % IV SOLN
INTRAVENOUS | Status: DC
  Administered 2015-10-02: 21:00:00 via INTRAVENOUS

## 2015-10-02 NOTE — ED Notes (Signed)
Per EMS, initially called out for nausea and vomiting, we arrived family informed them patient has problem with alcohol and prescriptions drugs, overdose on Morphine, Oxycodone, Alcohol. Given 1 mg Narcan on truck, and 2mg  Zofran IV.

## 2015-10-02 NOTE — ED Provider Notes (Signed)
CSN: [id number]     Arrival date & time 10/02/15  2015 History   First MD Initiated Contact with Patient 10/02/15 2028     Chief Complaint  Patient presents with  . Drug Overdose      Patient is a 65 y.o. male presenting with Overdose. The history is provided by the patient, the EMS personnel and a relative. The history is limited by the condition of the patient (AMS).  Drug Overdose  Pt was seen at 2030. Per EMS report: Call to EMS was initially for N/V, but when EMS arrived to scene, family told them pt "had a problem with alcohol and prescription drugs," and "may have overdosed" on morphine, oxycodone and alcohol. EMS gave IV zofran and narcan with transient improvement in AMS. Pt not forthcoming with information to ED staff on arrival.    Past Medical History  Diagnosis Date  . Back pain   . Meniere's disease   . Cancer ([address])   . COPD (chronic obstructive pulmonary disease) ([place])   . Chronic pain    Past Surgical History  Procedure Laterality Date  . Back surgery    . Nasal sinus surgery    . Bladder surgery    . Hard of hearing      Social History  Substance Use Topics  . Smoking status: Current Every Day Smoker    Types: Cigarettes  . Smokeless tobacco: Current User  . Alcohol Use: No    Review of Systems  Unable to perform ROS: Mental status change      Allergies  Review of patient's allergies indicates no known allergies.  Home Medications   Prior to Admission medications   Medication Sig Start Date End Date Taking? Authorizing Provider  albuterol (PROVENTIL HFA;VENTOLIN HFA) 108 (90 BASE) MCG/ACT inhaler Inhale 2 puffs into the lungs every 6 (six) hours as needed for wheezing or shortness of breath.     Historical Provider, MD  diazepam (VALIUM) 10 MG tablet Take 10 mg by mouth every 6 (six) hours as needed for anxiety. anxiety    Historical Provider, MD  ferrous sulfate 325 (65 FE) MG tablet Take 325 mg by mouth 2 (two) times daily.    Historical  Provider, MD  griseofulvin (GRIS-PEG) 250 MG tablet  03/28/13   Historical Provider, MD  morphine (MS CONTIN) 60 MG 12 hr tablet Take 60 mg by mouth 2 (two) times daily as needed for pain.    Historical Provider, MD  oxybutynin (DITROPAN-XL) 10 MG 24 hr tablet Take 1 tablet by mouth daily. 11/11/12   [employer], MD  oxycodone (ROXICODONE) 30 MG immediate release tablet Take 60 mg by mouth every 6 (six) hours as needed for pain.     Historical Provider, MD  oxyCODONE-acetaminophen (PERCOCET) 10-325 MG per tablet  02/03/13   Historical Provider, MD  pantoprazole (PROTONIX) 40 MG tablet Take 40 mg by mouth daily.  03/28/13   Historical Provider, MD  promethazine (PHENERGAN) 12.5 MG suppository Place 12.5 mg rectally every 6 (six) hours as needed for nausea.    Historical Provider, MD  promethazine (PHENERGAN) 25 MG suppository Place 1 suppository (25 mg total) rectally every 6 (six) hours as needed for nausea. 02/20/13   Milton Ferguson, MD  tamsulosin (FLOMAX) 0.4 MG CAPS Take 1 capsule by mouth at bedtime. 11/11/12   Rana Snare, MD  theophylline (THEODUR) 200 MG 12 hr tablet Take 1 tablet by mouth daily. 11/11/12   [employer], MD   BP 157/81 mmHg  Pulse 44  Temp(Src) 98.4 F (36.9 C) (Oral)  Resp 27  Ht 6' (1.829 m)  Wt 170 lb (77.111 kg)  BMI 23.05 kg/m2  SpO2 98%   Patient Vitals for the past 24 hrs:  BP Temp Temp src Pulse Resp SpO2 Height Weight  10/02/15 2330 136/59 mmHg - - (!) 46 18 100 % - -  10/02/15 2300 140/71 mmHg - - (!) 44 20 98 % - -  10/02/15 2230 142/61 mmHg - - (!) 39 24 96 % - -  10/02/15 2200 159/82 mmHg - - (!) 45 18 100 % - -  10/02/15 2130 139/72 mmHg - - (!) 52 20 95 % - -  10/02/15 2100 158/80 mmHg - - (!) 44 15 99 % - -  10/02/15 2030 157/81 mmHg - - (!) 44 (!) 27 98 % - -  10/02/15 2029 140/74 mmHg 98.4 F (36.9 C) Oral (!) 57 25 98 % 6' (1.829 m) 170 lb (77.111 kg)      Physical Exam  2035: Physical examination:  Nursing notes reviewed; Vital signs  and O2 SAT reviewed;  Constitutional: Well developed, Well nourished, In no acute distress; Head:  Normocephalic, atraumatic; Eyes: EOMI, PERRL, No scleral icterus; ENMT: Mouth and pharynx normal, Mucous membranes dry;; Neck: Supple, Full range of motion, No lymphadenopathy; Cardiovascular: Regular rate and rhythm, No gallop; Respiratory: Breath sounds clear & equal bilaterally, No wheezes. Normal respiratory effort/excursion; Chest: Nontender, Movement normal; Abdomen: Soft, Nontender, Nondistended, Normal bowel sounds; Genitourinary: No CVA tenderness; Extremities: Pulses normal, No tenderness, No edema, No calf edema or asymmetry.; Neuro: Lethargic, arousable to name and touch. Will not answer questions. +HOH per baseline. No facial droop. Speech minimal but clear. Moves all extremities spontaneously.; Skin: Color normal, Warm, Dry.   ED Course  Procedures (including critical care time) Labs Review  Imaging Review  I have personally reviewed and evaluated these images and lab results as part of my medical decision-making.   EKG Interpretation   Date/Time:  Saturday October 02 2015 20:24:31 EDT Ventricular Rate:  54 PR Interval:  174 QRS Duration: 104 QT Interval:  424 QTC Calculation: 402 R Axis:   64 Text Interpretation:  Sinus rhythm Low voltage, extremity leads When  compared with ECG of 08/27/2009 No significant change was found Confirmed  by [facility]  MD, Nunzio Cory [id number]) on 10/02/2015 9:12:04 PM      MDM  MDM Reviewed: previous chart, nursing note and vitals Reviewed previous: labs and ECG Interpretation: labs, ECG, x-ray and CT scan   Results for orders placed or performed during the hospital encounter of 10/02/15  Comprehensive metabolic panel  Result Value Ref Range   Sodium 140 135 - 145 mmol/L   Potassium 3.5 3.5 - 5.1 mmol/L   Chloride 110 101 - 111 mmol/L   CO2 21 (L) 22 - 32 mmol/L   Glucose, Bld 135 (H) 65 - 99 mg/dL   BUN 12 6 - 20 mg/dL   Creatinine, Ser 0.84  0.61 - 1.24 mg/dL   Calcium 8.6 (L) 8.9 - 10.3 mg/dL   Total Protein 6.5 6.5 - 8.1 g/dL   Albumin 3.2 (L) 3.5 - 5.0 g/dL   AST 15 15 - 41 U/L   ALT 13 (L) 17 - 63 U/L   Alkaline Phosphatase 39 38 - 126 U/L   Total Bilirubin 0.5 0.3 - 1.2 mg/dL   GFR calc non Af Amer >60 >60 mL/min   GFR calc Af Amer >60 >60 mL/min  Anion gap 9 5 - 15  Ethanol  Result Value Ref Range   Alcohol, Ethyl (B) <5 <5 mg/dL  Acetaminophen level  Result Value Ref Range   Acetaminophen (Tylenol), Serum <10 (L) 10 - 30 ug/mL  Lipase, blood  Result Value Ref Range   Lipase 23 11 - 51 U/L  Salicylate level  Result Value Ref Range   Salicylate Lvl 123456 2.8 - 30.0 mg/dL  Troponin I  Result Value Ref Range   Troponin I <0.03 <0.031 ng/mL  Lactic acid, plasma  Result Value Ref Range   Lactic Acid, Venous 0.8 0.5 - 2.0 mmol/L  CBC with Differential  Result Value Ref Range   WBC 8.7 4.0 - 10.5 K/uL   RBC 4.74 4.22 - 5.81 MIL/uL   Hemoglobin 13.9 13.0 - 17.0 g/dL   HCT 40.7 39.0 - 52.0 %   MCV 85.9 78.0 - 100.0 fL   MCH 29.3 26.0 - 34.0 pg   MCHC 34.2 30.0 - 36.0 g/dL   RDW 13.0 11.5 - 15.5 %   Platelets 308 150 - 400 K/uL   Neutrophils Relative % 78 %   Neutro Abs 6.8 1.7 - 7.7 K/uL   Lymphocytes Relative 11 %   Lymphs Abs 1.0 0.7 - 4.0 K/uL   Monocytes Relative 11 %   Monocytes Absolute 0.9 0.1 - 1.0 K/uL   Eosinophils Relative 0 %   Eosinophils Absolute 0.0 0.0 - 0.7 K/uL   Basophils Relative 0 %   Basophils Absolute 0.0 0.0 - 0.1 K/uL  Protime-INR  Result Value Ref Range   Prothrombin Time 13.6 11.6 - 15.2 seconds   INR 1.02 0.00 - 1.49  Urine rapid drug screen (hosp performed)  Result Value Ref Range   Opiates POSITIVE (A) NONE DETECTED   Cocaine NONE DETECTED NONE DETECTED   Benzodiazepines POSITIVE (A) NONE DETECTED   Amphetamines NONE DETECTED NONE DETECTED   Tetrahydrocannabinol NONE DETECTED NONE DETECTED   Barbiturates NONE DETECTED NONE DETECTED  Urinalysis, Routine w reflex  microscopic  Result Value Ref Range   Color, Urine YELLOW YELLOW   APPearance CLEAR CLEAR   Specific Gravity, Urine 1.010 1.005 - 1.030   pH 8.0 5.0 - 8.0   Glucose, UA NEGATIVE NEGATIVE mg/dL   Hgb urine dipstick NEGATIVE NEGATIVE   Bilirubin Urine NEGATIVE NEGATIVE   Ketones, ur NEGATIVE NEGATIVE mg/dL   Protein, ur NEGATIVE NEGATIVE mg/dL   Nitrite NEGATIVE NEGATIVE   Leukocytes, UA NEGATIVE NEGATIVE  Blood gas, arterial  Result Value Ref Range   FIO2 0.21    Delivery systems ROOM AIR    pH, Arterial 7.494 (H) 7.350 - 7.450   pCO2 arterial 27.8 (L) 35.0 - 45.0 mmHg   pO2, Arterial 86.7 80.0 - 100.0 mmHg   Bicarbonate 23.8 20.0 - 24.0 mEq/L   Acid-base deficit 1.7 0.0 - 2.0 mmol/L   O2 Saturation 96.6 %   Collection site BRACHIAL ARTERY    Drawn by [id number]    Sample type ARTERIAL    Allens test (pass/fail) NOT INDICATED (A) PASS   Ct Head Wo Contrast 10/02/2015  CLINICAL DATA:  Altered mental status.  Nausea and vomiting. EXAM: CT HEAD WITHOUT CONTRAST TECHNIQUE: Contiguous axial images were obtained from the base of the skull through the vertex without intravenous contrast. COMPARISON:  Head CT 08/04/2011 FINDINGS: Unchanged encephalomalacia in the left frontal lobe and left cerebellum. Mild generalized atrophy and chronic small vessel ischemia. No intracranial hemorrhage, mass effect, or midline shift. No hydrocephalus.  The basilar cisterns are patent. No evidence of territorial infarct. No intracranial fluid collection. Calvarium is intact. Post bilateral maxillary antrostomies. Mild mucosal thickening of the paranasal sinuses. Post probable right mastoidectomy. Minimal opacification of lower right mastoid air cells, unchanged. IMPRESSION: 1.  No acute intracranial abnormality. 2. 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 Electronically Signed   By: Jeb Levering M.D.   On: 10/02/2015 21:56   Dg Chest Port 1 View 10/02/2015  CLINICAL DATA:  Altered  mental status EXAM: PORTABLE CHEST 1 VIEW COMPARISON:  03/27/2013 chest radiograph. FINDINGS: Stable cardiomediastinal silhouette with normal heart size. No pneumothorax. No pleural effusion. No pulmonary edema. Questionable nodular density in the apical right upper lung, which appears increased in density since 03/27/2013, although this could be projectional. IMPRESSION: Questionable nodular density in the apical right upper lung, cannot exclude a pulmonary nodule. Recommend correlation with chest CT. Electronically Signed   By: Ilona Sorrel M.D.   On: 10/02/2015 21:20    2205:  Family has now arrived to the ED. They state [place] the past 3 weeks, "not eating or drinking anything." States "a girl he lives with called [place]" and they went to pick him up 4 days ago. States pt "has just been laying around" and "not eating or drinking." Pt had episode of N/V today, so they "called the ambulance because we didn't want him to get dehydrated." Family also states another family member has been "giving him their wellbutrin prescription" for the past week "because he's been depressed."   [id number]:   Pt given 2nd dose IV narcan with minimal effect; AMS likely due to polypharmacy. BP remains stable. Pt continues lethargic, but will open his eyes to name and touch. Refuses to speak with ED staff and turns his back on [place]. NAD, resps easy. T/C to [facility] Dr. Shanon Brow, case discussed, including:  HPI, pertinent PM/SHx, VS/PE, dx testing, ED course and treatment:  States pt does not need to be admitted to the hospital and needs a psych consult, she is agreeable to come to ED for pt evaluation.    Francine Graven, DO 10/06/15 1441

## 2015-10-02 NOTE — ED Notes (Signed)
EMS Vital Signs B/P-111/61 P-60 R-12 O2-98%. CBG-157.

## 2015-10-03 DIAGNOSIS — G8929 Other chronic pain: Secondary | ICD-10-CM | POA: Diagnosis present

## 2015-10-03 DIAGNOSIS — R400 Somnolence: Secondary | ICD-10-CM | POA: Diagnosis not present

## 2015-10-03 DIAGNOSIS — J449 Chronic obstructive pulmonary disease, unspecified: Secondary | ICD-10-CM | POA: Diagnosis present

## 2015-10-03 DIAGNOSIS — Z79899 Other long term (current) drug therapy: Secondary | ICD-10-CM | POA: Diagnosis not present

## 2015-10-03 DIAGNOSIS — R001 Bradycardia, unspecified: Secondary | ICD-10-CM | POA: Diagnosis present

## 2015-10-03 LAB — CBC
HCT: 36.7 % — ABNORMAL LOW (ref 39.0–52.0)
Hemoglobin: 12.5 g/dL — ABNORMAL LOW (ref 13.0–17.0)
MCH: 29.1 pg (ref 26.0–34.0)
MCHC: 34.1 g/dL (ref 30.0–36.0)
MCV: 85.3 fL (ref 78.0–100.0)
PLATELETS: 342 10*3/uL (ref 150–400)
RBC: 4.3 MIL/uL (ref 4.22–5.81)
RDW: 13.1 % (ref 11.5–15.5)
WBC: 9.1 10*3/uL (ref 4.0–10.5)

## 2015-10-03 LAB — BASIC METABOLIC PANEL
ANION GAP: 9 (ref 5–15)
BUN: 14 mg/dL (ref 6–20)
CALCIUM: 9.1 mg/dL (ref 8.9–10.3)
CO2: 25 mmol/L (ref 22–32)
CREATININE: 0.93 mg/dL (ref 0.61–1.24)
Chloride: 110 mmol/L (ref 101–111)
Glucose, Bld: 124 mg/dL — ABNORMAL HIGH (ref 65–99)
Potassium: 3.6 mmol/L (ref 3.5–5.1)
SODIUM: 144 mmol/L (ref 135–145)

## 2015-10-03 LAB — MAGNESIUM: MAGNESIUM: 2 mg/dL (ref 1.7–2.4)

## 2015-10-03 LAB — TSH: TSH: 0.153 u[IU]/mL — AB (ref 0.350–4.500)

## 2015-10-03 MED ORDER — THIAMINE HCL 100 MG/ML IJ SOLN
INTRAMUSCULAR | Status: AC
  Filled 2015-10-03: qty 2

## 2015-10-03 MED ORDER — FOLIC ACID 5 MG/ML IJ SOLN
INTRAMUSCULAR | Status: AC
  Filled 2015-10-03: qty 0.2

## 2015-10-03 MED ORDER — THIAMINE HCL 100 MG/ML IJ SOLN
Freq: Once | INTRAVENOUS | Status: AC
  Administered 2015-10-03: 04:00:00 via INTRAVENOUS
  Filled 2015-10-03: qty 1000

## 2015-10-03 MED ORDER — M.V.I. ADULT IV INJ
INJECTION | INTRAVENOUS | Status: AC
  Filled 2015-10-03: qty 10

## 2015-10-03 MED ORDER — SODIUM CHLORIDE 0.9% FLUSH
3.0000 mL | Freq: Two times a day (BID) | INTRAVENOUS | Status: DC
Start: 2015-10-03 — End: 2015-10-03
  Administered 2015-10-03: 3 mL via INTRAVENOUS

## 2015-10-03 MED ORDER — NALOXONE HCL 0.4 MG/ML IJ SOLN
0.4000 mg | Freq: Once | INTRAMUSCULAR | Status: AC
  Administered 2015-10-03: 0.4 mg via INTRAVENOUS
  Filled 2015-10-03: qty 1

## 2015-10-03 NOTE — Progress Notes (Signed)
Patient sleepy, but aroused with verbal cuing.  He is alert/oriented and hard of hearing.  Sister is at bedside and states he lives alone and does not take his medications as prescribed by pcp.  Sister continues to state that when he is at home, he takes his prescribed narcotics and benzo's multiple times/day, because he "forgets" what he has and has not taken.  She also states upon d/c, patient will be "staying" at his mothers along with his sister.

## 2015-10-03 NOTE — Progress Notes (Signed)
Pt d/c'd home with sister and mother.  Sister states she and her mother will monitor his medication intake and will make a 2 week f/u with Dr. Gerarda Fraction.  No c/o. In stable condition.

## 2015-10-03 NOTE — H&P (Signed)
PCP:   Glo Herring., MD   Chief Complaint:  sleepy  HPI: 65 yo male h/o chronic back pain, copd brought in by family for what they suspect is taking too much of his medications and sleeping all the time.  [place]  His daughter recently moved in with him.  And for weeks he just lays around and does minimal, sleeps a lot and doesn't eat well.  That daughter called her sister from here to go get him so she did several days ago.  She brought in him for these issues.  They think he is misusing his medications and/or is possibly depressed.  Pt denies taking extra of his meds (on several sedating meds ).  He says he has been depressed lately and mentions this is because his daughter moved in with him.  Pt denies suicidal thoughts and denies taking any extra meds today.  Pt easily arouses to voice and easily dozes off during interview.  He denies any fevers.  No recent illnesses.  No n/v/d.  He denies drinking etoh regularly, althought this is not what family reports.  Pt referred for admission for drug overdose.  Review of Systems:  Positive and negative as per HPI otherwise all other systems are negative but highly unreliable  Past Medical History: Past Medical History  Diagnosis Date  . Back pain   . Meniere's disease   . Cancer ([place])   . COPD (chronic obstructive pulmonary disease) ([place])   . Chronic pain    Past Surgical History  Procedure Laterality Date  . Back surgery    . Nasal sinus surgery    . Bladder surgery    . Hard of hearing      Medications: Prior to Admission medications   Medication Sig Start Date End Date Taking? Authorizing Provider  albuterol (PROVENTIL HFA;VENTOLIN HFA) 108 (90 BASE) MCG/ACT inhaler Inhale 2 puffs into the lungs every 6 (six) hours as needed for wheezing or shortness of breath.    Yes Historical Provider, MD  atorvastatin (LIPITOR) 20 MG tablet Take 20 mg by mouth daily.   Yes Historical Provider, MD   cetirizine (ZYRTEC) 10 MG tablet Take 10 mg by mouth daily.   Yes Historical Provider, MD  Coenzyme Q10 (CO Q 10) 100 MG CAPS Take 100 mg by mouth daily.   Yes Historical Provider, MD  diazepam (VALIUM) 10 MG tablet Take 10 mg by mouth every 6 (six) hours as needed for anxiety. anxiety   Yes Historical Provider, MD  gabapentin (NEURONTIN) 100 MG capsule Take 100 mg by mouth 3 (three) times daily.   Yes Historical Provider, MD  morphine (MS CONTIN) 100 MG 12 hr tablet Take 100 mg by mouth 2 (two) times daily as needed for pain.   Yes Historical Provider, MD  oxycodone (ROXICODONE) 30 MG immediate release tablet Take 60 mg by mouth every 6 (six) hours as needed for pain.    Yes Historical Provider, MD  predniSONE (DELTASONE) 10 MG tablet Take 10-50 mg by mouth See admin instructions. Starting on 09/29/2015 take 5 tabs daily for 3 days, then 4 tabs daily for 3 days, then 3 tabs daily for 3 days, then 2 tabs daily for 3 days, then 1 tab daily for 3 days   Yes Historical Provider, MD  theophylline (THEO-24) 300 MG 24 hr capsule Take 300 mg by mouth daily.   Yes Historical Provider, MD    Allergies:  No Known Allergies  Social History:  reports that he has been smoking Cigarettes.  He uses smokeless tobacco. He reports that he does not drink alcohol or use illicit drugs.  Family History: unonbtainable due to above hpi  Physical Exam: Filed Vitals:   10/02/15 2230 10/02/15 2300 10/02/15 2330 10/03/15 0000  BP: 142/61 140/71 136/59 153/74  Pulse: 39 44 46 43  Temp:      TempSrc:      Resp: 24 20 18 30   Height:      Weight:      SpO2: 96% 98% 100% 95%   General appearance: cooperative, no distress and slowed mentation Head: Normocephalic, without obvious abnormality, atraumatic Eyes: negative Nose: Nares normal. Septum midline. Mucosa normal. No drainage or sinus tenderness. Neck: no JVD and supple, symmetrical, trachea midline Lungs: clear to auscultation bilaterally Heart: regular rate  and rhythm, S1, S2 normal, no murmur, click, rub or gallop Abdomen: soft, non-tender; bowel sounds normal; no masses,  no organomegaly Extremities: extremities normal, atraumatic, no cyanosis or edema Pulses: 2+ and symmetric Skin: Skin color, texture, turgor normal. No rashes or lesions Neurologic: Grossly normal drowsy    Labs on Admission:   Recent Labs  10/02/15 2223  NA 140  K 3.5  CL 110  CO2 21*  GLUCOSE 135*  BUN 12  CREATININE 0.84  CALCIUM 8.6*    Recent Labs  10/02/15 2223  AST 15  ALT 13*  ALKPHOS 39  BILITOT 0.5  PROT 6.5  ALBUMIN 3.2*    Recent Labs  10/02/15 2223  LIPASE 23    Recent Labs  10/02/15 2223  WBC 8.7  NEUTROABS 6.8  HGB 13.9  HCT 40.7  MCV 85.9  PLT 308    Recent Labs  10/02/15 2223  TROPONINI <0.03    Radiological Exams on Admission: Ct Head Wo Contrast  10/02/2015  CLINICAL DATA:  Altered mental status.  Nausea and vomiting. EXAM: CT HEAD WITHOUT CONTRAST TECHNIQUE: Contiguous axial images were obtained from the base of the skull through the vertex without intravenous contrast. COMPARISON:  Head CT 08/04/2011 FINDINGS: Unchanged encephalomalacia in the left frontal lobe and left cerebellum. Mild generalized atrophy and chronic small vessel ischemia. No intracranial hemorrhage, mass effect, or midline shift. No hydrocephalus. The basilar cisterns are patent. No evidence of territorial infarct. No intracranial fluid collection. Calvarium is intact. Post bilateral maxillary antrostomies. Mild mucosal thickening of the paranasal sinuses. Post probable right mastoidectomy. Minimal opacification of lower right mastoid air cells, unchanged. IMPRESSION: 1.  No acute intracranial abnormality. 2. 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 Electronically Signed   By: Jeb Levering M.D.   On: 10/02/2015 21:56   Dg Chest Port 1 View  10/02/2015  CLINICAL DATA:  Altered mental status EXAM: PORTABLE CHEST 1  VIEW COMPARISON:  03/27/2013 chest radiograph. FINDINGS: Stable cardiomediastinal silhouette with normal heart size. No pneumothorax. No pleural effusion. No pulmonary edema. Questionable nodular density in the apical right upper lung, which appears increased in density since 03/27/2013, although this could be projectional. IMPRESSION: Questionable nodular density in the apical right upper lung, cannot exclude a pulmonary nodule. Recommend correlation with chest CT. Electronically Signed   By: Ilona Sorrel M.D.   On: 10/02/2015 21:20    Assessment/Plan  65 yo male with drowsiness likely from polypharmacy  Principal Problem:   Drowsy- pt denies taking more meds than usual.  Will obs on telemetry.  Give another dose of narcan (pt was given one  Over  3 hours ago and had some response).  Hold all sedatives.   Once he wakes up, try to obtain more history and offer outpatient services if needed.  May have a component of major depression but hard to tell at this time.  Active Problems:   Tobacco abuse   COPD (chronic obstructive pulmonary disease) ([place])- noted, lungs clear.  o2 sats normal on RA   Chronic pain- noted   Sinus bradycardia- monitor on telemetry  obs on tele.  Full code.  His daughter is updated, and advised he will be obs until he wakes up more and see what he says then.    DAVID,RACHAL A 10/03/2015, 1:41 AM

## 2015-10-03 NOTE — Discharge Summary (Signed)
Physician Discharge Summary  Joshua [name] [id number] DOB: 02-04-1951 DOA: 10/02/2015  PCP: Glo Herring., MD  Admit date: 10/02/2015 Discharge date: 10/03/2015  Time spent: 45 minutes  Recommendations for Outpatient Follow-up:  -We'll be discharge home today. -Advised to follow-up with primary care provider in 2 weeks, at this time close attention will need to be made to pain and anxiety regimen.   Discharge Diagnoses:  Principal Problem:   Drowsy Active Problems:   Tobacco abuse   COPD (chronic obstructive pulmonary disease) ([facility])   Chronic pain   Sinus bradycardia   Discharge Condition: Stable and improved  Filed Weights   10/02/15 2029 10/03/15 0233  Weight: 77.111 kg (170 lb) 75.9 kg (167 lb 5.3 oz)    History of present illness:  As per Dr. Shanon Brow on 4/23: 65 yo male h/o chronic back pain, copd brought in by family for what they suspect is taking too much of his medications and sleeping all the time. [place] His daughter recently moved in with him. And for weeks he just lays around and does minimal, sleeps a lot and doesn't eat well. That daughter called her sister from here to go get him so she did several days ago. She brought in him for these issues. They think he is misusing his medications and/or is possibly depressed. Pt denies taking extra of his meds (on several sedating meds ). He says he has been depressed lately and mentions this is because his daughter moved in with him. Pt denies suicidal thoughts and denies taking any extra meds today. Pt easily arouses to voice and easily dozes off during interview. He denies any fevers. No recent illnesses. No n/v/d. He denies drinking etoh regularly, althought this is not what family reports. Pt referred for admission for drug overdose.  Hospital Course:   Acute encephalopathy -This is resolved.  -I believe this is secondary to polypharmacy. He is on many  narcotics and antianxiety medications. Close attention will need to be made to his medication regimen at time of outpatient follow-up.  Patient has been in the hospital less than 24 hours. Rest of chronic medical conditions are stable.  Procedures:  None   Consultations:  None  Discharge Instructions  Discharge Instructions    Diet - low sodium heart healthy    Complete by:  As directed      Increase activity slowly    Complete by:  As directed             Medication List    STOP taking these medications        diazepam 10 MG tablet  Commonly known as:  VALIUM     oxycodone 30 MG immediate release tablet  Commonly known as:  ROXICODONE     predniSONE 10 MG tablet  Commonly known as:  DELTASONE      TAKE these medications        albuterol 108 (90 Base) MCG/ACT inhaler  Commonly known as:  PROVENTIL HFA;VENTOLIN HFA  Inhale 2 puffs into the lungs every 6 (six) hours as needed for wheezing or shortness of breath.     atorvastatin 20 MG tablet  Commonly known as:  LIPITOR  Take 20 mg by mouth daily.     cetirizine 10 MG tablet  Commonly known as:  ZYRTEC  Take 10 mg by mouth daily.     Co Q 10 100 MG Caps  Take 100 mg by mouth  daily.     gabapentin 100 MG capsule  Commonly known as:  NEURONTIN  Take 100 mg by mouth 3 (three) times daily.     morphine 100 MG 12 hr tablet  Commonly known as:  MS CONTIN  Take 100 mg by mouth 2 (two) times daily as needed for pain.     theophylline 300 MG 24 hr capsule  Commonly known as:  THEO-24  Take 300 mg by mouth daily.       No Known Allergies     Follow-up Information    Follow up with Glo Herring., MD. Schedule an appointment as soon as possible for a visit in 1 week.   Specialty:  Internal Medicine   Contact information:   [address] [place] [id number] [phone]        The results of significant diagnostics from this hospitalization (including imaging, microbiology, ancillary and  laboratory) are listed below for reference.    Significant Diagnostic Studies: Ct Head Wo Contrast  10/02/2015  CLINICAL DATA:  Altered mental status.  Nausea and vomiting. EXAM: CT HEAD WITHOUT CONTRAST TECHNIQUE: Contiguous axial images were obtained from the base of the skull through the vertex without intravenous contrast. COMPARISON:  Head CT 08/04/2011 FINDINGS: Unchanged encephalomalacia in the left frontal lobe and left cerebellum. Mild generalized atrophy and chronic small vessel ischemia. No intracranial hemorrhage, mass effect, or midline shift. No hydrocephalus. The basilar cisterns are patent. No evidence of territorial infarct. No intracranial fluid collection. Calvarium is intact. Post bilateral maxillary antrostomies. Mild mucosal thickening of the paranasal sinuses. Post probable right mastoidectomy. Minimal opacification of lower right mastoid air cells, unchanged. IMPRESSION: 1.  No acute intracranial abnormality. 2. 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 Electronically Signed   By: Jeb Levering M.D.   On: 10/02/2015 21:56   Dg Chest Port 1 View  10/02/2015  CLINICAL DATA:  Altered mental status EXAM: PORTABLE CHEST 1 VIEW COMPARISON:  03/27/2013 chest radiograph. FINDINGS: Stable cardiomediastinal silhouette with normal heart size. No pneumothorax. No pleural effusion. No pulmonary edema. Questionable nodular density in the apical right upper lung, which appears increased in density since 03/27/2013, although this could be projectional. IMPRESSION: Questionable nodular density in the apical right upper lung, cannot exclude a pulmonary nodule. Recommend correlation with chest CT. Electronically Signed   By: Ilona Sorrel M.D.   On: 10/02/2015 21:20    Microbiology: No results found for this or any previous visit (from the past 240 hour(s)).   Labs: Basic Metabolic Panel:  Recent Labs Lab 10/02/15 2223 10/03/15 0629  NA 140 144  K 3.5 3.6    CL 110 110  CO2 21* 25  GLUCOSE 135* 124*  BUN 12 14  CREATININE 0.84 0.93  CALCIUM 8.6* 9.1  MG  --  2.0   Liver Function Tests:  Recent Labs Lab 10/02/15 2223  AST 15  ALT 13*  ALKPHOS 39  BILITOT 0.5  PROT 6.5  ALBUMIN 3.2*    Recent Labs Lab 10/02/15 2223  LIPASE 23   No results for input(s): AMMONIA in the last 168 hours. CBC:  Recent Labs Lab 10/02/15 2223 10/03/15 0629  WBC 8.7 9.1  NEUTROABS 6.8  --   HGB 13.9 12.5*  HCT 40.7 36.7*  MCV 85.9 85.3  PLT 308 342   Cardiac Enzymes:  Recent Labs Lab 10/02/15 2223  TROPONINI <0.03   BNP: BNP (last 3 results) No results for input(s): BNP in the last  8760 hours.  ProBNP (last 3 results) No results for input(s): PROBNP in the last 8760 hours.  CBG: No results for input(s): GLUCAP in the last 168 hours.     SignedLelon Frohlich  [facility] Pager: [phone] 10/03/2015, 5:39 PM

## 2015-10-05 LAB — THEOPHYLLINE LEVEL: THEOPHYLLINE LVL: 1.1 ug/mL — AB (ref 10.0–20.0)

## 2016-04-06 ENCOUNTER — Other Ambulatory Visit (HOSPITAL_COMMUNITY): Payer: Self-pay | Admitting: Internal Medicine

## 2016-04-06 DIAGNOSIS — G834 Cauda equina syndrome: Secondary | ICD-10-CM

## 2016-04-11 ENCOUNTER — Ambulatory Visit (HOSPITAL_COMMUNITY)
Admission: RE | Admit: 2016-04-11 | Discharge: 2016-04-11 | Disposition: A | Discharge status: Home / Self Care | Payer: Medicare Other | Source: Ambulatory Visit | Attending: Internal Medicine | Admitting: Internal Medicine

## 2016-04-11 DIAGNOSIS — M48061 Spinal stenosis, lumbar region without neurogenic claudication: Secondary | ICD-10-CM | POA: Insufficient documentation

## 2016-04-11 DIAGNOSIS — M47896 Other spondylosis, lumbar region: Secondary | ICD-10-CM | POA: Diagnosis not present

## 2016-04-11 DIAGNOSIS — G834 Cauda equina syndrome: Secondary | ICD-10-CM | POA: Diagnosis not present

## 2016-04-11 DIAGNOSIS — M5127 Other intervertebral disc displacement, lumbosacral region: Secondary | ICD-10-CM | POA: Diagnosis not present

## 2016-04-11 DIAGNOSIS — Z9889 Other specified postprocedural states: Secondary | ICD-10-CM | POA: Diagnosis not present

## 2016-08-01 ENCOUNTER — Ambulatory Visit (INDEPENDENT_AMBULATORY_CARE_PROVIDER_SITE_OTHER): Payer: Medicare Other | Admitting: Urology

## 2016-08-01 ENCOUNTER — Other Ambulatory Visit (HOSPITAL_COMMUNITY)
Admission: RE | Admit: 2016-08-01 | Discharge: 2016-08-01 | Disposition: A | Discharge status: Home / Self Care | Payer: Medicare Other | Source: Ambulatory Visit | Attending: Urology | Admitting: Urology

## 2016-08-01 DIAGNOSIS — N430 Encysted hydrocele: Secondary | ICD-10-CM | POA: Diagnosis not present

## 2016-08-01 DIAGNOSIS — C678 Malignant neoplasm of overlapping sites of bladder: Secondary | ICD-10-CM | POA: Diagnosis present

## 2016-08-02 ENCOUNTER — Emergency Department (HOSPITAL_COMMUNITY): Payer: Medicare Other

## 2016-08-02 ENCOUNTER — Observation Stay (HOSPITAL_COMMUNITY): Payer: Medicare Other

## 2016-08-02 ENCOUNTER — Observation Stay (HOSPITAL_COMMUNITY)
Admission: EM | Admit: 2016-08-02 | Discharge: 2016-08-03 | Disposition: A | Discharge status: Home / Self Care | Payer: Medicare Other | Attending: Internal Medicine | Admitting: Internal Medicine

## 2016-08-02 ENCOUNTER — Encounter (HOSPITAL_COMMUNITY): Payer: Self-pay | Admitting: Cardiology

## 2016-08-02 DIAGNOSIS — R05 Cough: Secondary | ICD-10-CM | POA: Insufficient documentation

## 2016-08-02 DIAGNOSIS — J449 Chronic obstructive pulmonary disease, unspecified: Secondary | ICD-10-CM | POA: Diagnosis present

## 2016-08-02 DIAGNOSIS — R109 Unspecified abdominal pain: Secondary | ICD-10-CM | POA: Insufficient documentation

## 2016-08-02 DIAGNOSIS — R509 Fever, unspecified: Secondary | ICD-10-CM | POA: Diagnosis not present

## 2016-08-02 DIAGNOSIS — Z720 Tobacco use: Secondary | ICD-10-CM | POA: Diagnosis present

## 2016-08-02 DIAGNOSIS — F17210 Nicotine dependence, cigarettes, uncomplicated: Secondary | ICD-10-CM | POA: Insufficient documentation

## 2016-08-02 DIAGNOSIS — Z79899 Other long term (current) drug therapy: Secondary | ICD-10-CM | POA: Diagnosis not present

## 2016-08-02 DIAGNOSIS — G8929 Other chronic pain: Secondary | ICD-10-CM | POA: Diagnosis present

## 2016-08-02 DIAGNOSIS — R059 Cough, unspecified: Secondary | ICD-10-CM

## 2016-08-02 DIAGNOSIS — R112 Nausea with vomiting, unspecified: Secondary | ICD-10-CM | POA: Diagnosis not present

## 2016-08-02 DIAGNOSIS — I959 Hypotension, unspecified: Secondary | ICD-10-CM | POA: Diagnosis present

## 2016-08-02 LAB — URINALYSIS, ROUTINE W REFLEX MICROSCOPIC
BACTERIA UA: NONE SEEN
Bilirubin Urine: NEGATIVE
Glucose, UA: NEGATIVE mg/dL
Hgb urine dipstick: NEGATIVE
Ketones, ur: 5 mg/dL — AB
LEUKOCYTES UA: NEGATIVE
Nitrite: NEGATIVE
PROTEIN: 30 mg/dL — AB
Specific Gravity, Urine: 1.015 (ref 1.005–1.030)
pH: 9 — ABNORMAL HIGH (ref 5.0–8.0)

## 2016-08-02 LAB — CBC WITH DIFFERENTIAL/PLATELET
BASOS PCT: 0 %
Basophils Absolute: 0 10*3/uL (ref 0.0–0.1)
EOS ABS: 0 10*3/uL (ref 0.0–0.7)
Eosinophils Relative: 0 %
HEMATOCRIT: 42 % (ref 39.0–52.0)
HEMOGLOBIN: 14.1 g/dL (ref 13.0–17.0)
LYMPHS ABS: 1 10*3/uL (ref 0.7–4.0)
Lymphocytes Relative: 11 %
MCH: 29.5 pg (ref 26.0–34.0)
MCHC: 33.6 g/dL (ref 30.0–36.0)
MCV: 87.9 fL (ref 78.0–100.0)
Monocytes Absolute: 0.6 10*3/uL (ref 0.1–1.0)
Monocytes Relative: 6 %
NEUTROS ABS: 7.6 10*3/uL (ref 1.7–7.7)
NEUTROS PCT: 83 %
Platelets: 277 10*3/uL (ref 150–400)
RBC: 4.78 MIL/uL (ref 4.22–5.81)
RDW: 13.8 % (ref 11.5–15.5)
WBC: 9.2 10*3/uL (ref 4.0–10.5)

## 2016-08-02 LAB — BASIC METABOLIC PANEL
Anion gap: 13 (ref 5–15)
BUN: 12 mg/dL (ref 6–20)
CHLORIDE: 100 mmol/L — AB (ref 101–111)
CO2: 22 mmol/L (ref 22–32)
Calcium: 10.1 mg/dL (ref 8.9–10.3)
Creatinine, Ser: 0.97 mg/dL (ref 0.61–1.24)
GFR calc non Af Amer: >60 mL/min (ref >60)
Glucose, Bld: 129 mg/dL — ABNORMAL HIGH (ref 65–99)
POTASSIUM: 3.8 mmol/L (ref 3.5–5.1)
SODIUM: 135 mmol/L (ref 135–145)

## 2016-08-02 LAB — LIPASE, BLOOD: LIPASE: 10 U/L — AB (ref 11–51)

## 2016-08-02 LAB — HEPATIC FUNCTION PANEL
ALT: 14 U/L — ABNORMAL LOW (ref 17–63)
AST: 26 U/L (ref 15–41)
Albumin: 4.4 g/dL (ref 3.5–5.0)
Alkaline Phosphatase: 62 U/L (ref 38–126)
Bilirubin, Direct: 0.1 mg/dL (ref 0.1–0.5)
Indirect Bilirubin: 0.8 mg/dL (ref 0.3–0.9)
TOTAL PROTEIN: 7.6 g/dL (ref 6.5–8.1)
Total Bilirubin: 0.9 mg/dL (ref 0.3–1.2)

## 2016-08-02 LAB — TROPONIN I

## 2016-08-02 MED ORDER — SODIUM CHLORIDE 0.9 % IV SOLN
INTRAVENOUS | Status: DC
  Administered 2016-08-02 – 2016-08-03 (×2): via INTRAVENOUS

## 2016-08-02 MED ORDER — ONDANSETRON HCL 4 MG/2ML IJ SOLN
4.0000 mg | Freq: Once | INTRAMUSCULAR | Status: AC
  Administered 2016-08-02: 4 mg via INTRAVENOUS

## 2016-08-02 MED ORDER — SODIUM CHLORIDE 0.9 % IV SOLN
INTRAVENOUS | Status: DC
  Administered 2016-08-02: 18:00:00 via INTRAVENOUS

## 2016-08-02 MED ORDER — ALBUTEROL SULFATE (2.5 MG/3ML) 0.083% IN NEBU: 2.5000 mg | INHALATION_SOLUTION | RESPIRATORY_TRACT | Status: DC | PRN

## 2016-08-02 MED ORDER — ACETAMINOPHEN 325 MG PO TABS
650.0000 mg | ORAL_TABLET | Freq: Four times a day (QID) | ORAL | Status: DC | PRN
  Administered 2016-08-02: 650 mg via ORAL
  Filled 2016-08-02: qty 2

## 2016-08-02 MED ORDER — GUAIFENESIN ER 600 MG PO TB12
600.0000 mg | ORAL_TABLET | Freq: Two times a day (BID) | ORAL | Status: DC
  Administered 2016-08-02 – 2016-08-03 (×2): 600 mg via ORAL
  Filled 2016-08-02 (×2): qty 1

## 2016-08-02 MED ORDER — MORPHINE SULFATE (PF) 4 MG/ML IV SOLN
4.0000 mg | INTRAVENOUS | Status: AC | PRN
  Administered 2016-08-02 – 2016-08-03 (×2): 4 mg via INTRAVENOUS
  Filled 2016-08-02 (×3): qty 1

## 2016-08-02 MED ORDER — SODIUM CHLORIDE 0.9% FLUSH
3.0000 mL | Freq: Two times a day (BID) | INTRAVENOUS | Status: DC
  Administered 2016-08-02 – 2016-08-03 (×2): 3 mL via INTRAVENOUS

## 2016-08-02 MED ORDER — ONDANSETRON HCL 4 MG/2ML IJ SOLN
INTRAMUSCULAR | Status: AC
  Administered 2016-08-02: 4 mg via INTRAVENOUS
  Filled 2016-08-02: qty 2

## 2016-08-02 MED ORDER — ONDANSETRON HCL 4 MG/2ML IJ SOLN
4.0000 mg | Freq: Once | INTRAMUSCULAR | Status: AC
  Administered 2016-08-02: 4 mg via INTRAVENOUS
  Filled 2016-08-02: qty 2

## 2016-08-02 MED ORDER — SODIUM CHLORIDE 0.9 % IV SOLN
Freq: Once | INTRAVENOUS | Status: AC
  Administered 2016-08-02: 10:00:00 via INTRAVENOUS

## 2016-08-02 MED ORDER — LORATADINE 10 MG PO TABS
10.0000 mg | ORAL_TABLET | Freq: Every day | ORAL | Status: DC
  Administered 2016-08-03: 10 mg via ORAL
  Filled 2016-08-02 (×2): qty 1

## 2016-08-02 MED ORDER — MORPHINE SULFATE ER 100 MG PO TBCR
100.0000 mg | EXTENDED_RELEASE_TABLET | Freq: Two times a day (BID) | ORAL | Status: DC
  Administered 2016-08-02 – 2016-08-03 (×2): 100 mg via ORAL
  Filled 2016-08-02 (×2): qty 1

## 2016-08-02 MED ORDER — DULOXETINE HCL 20 MG PO CPEP
20.0000 mg | ORAL_CAPSULE | Freq: Every day | ORAL | Status: DC
  Administered 2016-08-03: 20 mg via ORAL
  Filled 2016-08-02 (×3): qty 1

## 2016-08-02 MED ORDER — ENOXAPARIN SODIUM 40 MG/0.4ML ~~LOC~~ SOLN
40.0000 mg | SUBCUTANEOUS | Status: DC
  Administered 2016-08-02: 40 mg via SUBCUTANEOUS
  Filled 2016-08-02: qty 0.4

## 2016-08-02 MED ORDER — PROMETHAZINE HCL 25 MG/ML IJ SOLN: 12.5000 mg | Freq: Four times a day (QID) | INTRAMUSCULAR | Status: DC | PRN

## 2016-08-02 MED ORDER — PROMETHAZINE HCL 25 MG/ML IJ SOLN
12.5000 mg | Freq: Once | INTRAMUSCULAR | Status: AC
  Administered 2016-08-02: 12.5 mg via INTRAVENOUS
  Filled 2016-08-02: qty 1

## 2016-08-02 MED ORDER — ONDANSETRON HCL 4 MG/2ML IJ SOLN
4.0000 mg | Freq: Three times a day (TID) | INTRAMUSCULAR | Status: AC | PRN
  Administered 2016-08-02: 4 mg via INTRAVENOUS
  Filled 2016-08-02: qty 2

## 2016-08-02 MED ORDER — IOPAMIDOL (ISOVUE-300) INJECTION 61%
100.0000 mL | Freq: Once | INTRAVENOUS | Status: AC | PRN
  Administered 2016-08-02: 100 mL via INTRAVENOUS

## 2016-08-02 MED ORDER — OXYCODONE HCL 5 MG PO TABS
30.0000 mg | ORAL_TABLET | Freq: Four times a day (QID) | ORAL | Status: DC | PRN
  Administered 2016-08-03 (×2): 30 mg via ORAL
  Filled 2016-08-02 (×3): qty 6

## 2016-08-02 NOTE — ED Triage Notes (Signed)
"  cold " symptoms for 3 weeks.  Vomiting this morning. C/o abdominal pain.

## 2016-08-02 NOTE — ED Notes (Signed)
Pt still awaiting ct scan. They came while attempting cath but stated will come back.

## 2016-08-02 NOTE — ED Provider Notes (Signed)
[facility] DEPT Provider Note   CSN: [id number] Arrival date & time: 08/02/16  [id number]     History   Chief Complaint Chief Complaint  Patient presents with  . Emesis    HPI Joshua [name] is a 66 y.o. male.  HPI  Pt was seen at 1050. Per pt and his family, c/o gradual onset and persistence of multiple intermittent episodes of N/V that began overnight last night. Has been associated with generalized abd "pain" and increased generalized weakness today. Family states pt was "unable to stand today" due to "feeling so weak."  Pt's family states pt has "been coughing a lot" and "laying around" for the past week.  Denies diarrhea, no CP/SOB, no back pain, no fevers, no black or blood in stools or emesis.    Past Medical History:  Diagnosis Date  . Back pain   . Cancer ([place])   . Chronic pain   . COPD (chronic obstructive pulmonary disease) ([place])   . Meniere's disease     Patient Active Problem List   Diagnosis Date Noted  . Drowsy 10/03/2015  . Sinus bradycardia 10/03/2015  . COPD (chronic obstructive pulmonary disease) ([place])   . Chronic pain   . Polypharmacy   . 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 Postlaminectomy syndrome, lumbar region 11/13/2012  . Thoracic or lumbosacral neuritis or radiculitis, unspecified 11/13/2012  . Lumbosacral spondylosis without myelopathy 11/13/2012  . Tobacco abuse 11/13/2012  . Unspecified hereditary and idiopathic peripheral neuropathy 11/13/2012    Past Surgical History:  Procedure Laterality Date  . BACK SURGERY    . BLADDER SURGERY    . hard of hearing    . NASAL SINUS SURGERY         Home Medications    Prior to Admission medications   Medication Sig Start Date End Date Taking? Authorizing Provider  albuterol (PROVENTIL HFA;VENTOLIN HFA) 108 (90 BASE) MCG/ACT inhaler Inhale 2 puffs into the lungs every 6 (six) hours as needed for wheezing or shortness of breath.    Yes Historical Provider, MD  cetirizine (ZYRTEC) 10 MG tablet  Take 10 mg by mouth daily.   Yes Historical Provider, MD  Coenzyme Q10 (CO Q 10) 100 MG CAPS Take 100 mg by mouth daily.   Yes Historical Provider, MD  DULoxetine (CYMBALTA) 20 MG capsule Take 20 mg by mouth daily.   Yes Historical Provider, MD  morphine (MS CONTIN) 100 MG 12 hr tablet Take 100 mg by mouth 2 (two) times daily as needed for pain.   Yes Historical Provider, MD  oxycodone (ROXICODONE) 30 MG immediate release tablet Take 30 mg by mouth every 6 (six) hours as needed for pain.   Yes Historical Provider, MD    Family History History reviewed. No pertinent family history.  Social History Social History  Substance Use Topics  . Smoking status: Current Every Day Smoker    Types: Cigarettes  . Smokeless tobacco: Current User  . Alcohol use No     Allergies   Patient has no known allergies.   Review of Systems Review of Systems ROS: Statement: All systems negative except as marked or noted in the HPI; Constitutional: Negative for fever and chills. ; ; Eyes: Negative for eye pain, redness and discharge. ; ; ENMT: Negative for ear pain, hoarseness, nasal congestion, sinus pressure and sore throat. ; ; Cardiovascular: Negative for chest pain, palpitations, diaphoresis, dyspnea and peripheral edema. ; ; Respiratory: +cough. Negative for wheezing and stridor. ; ; Gastrointestinal: +N/V, abd  pain. Negative for diarrhea, blood in stool, hematemesis, jaundice and rectal bleeding. . ; ; Genitourinary: Negative for dysuria, flank pain and hematuria. ; ; Musculoskeletal: Negative for back pain and neck pain. Negative for swelling and trauma.; ; Skin: Negative for pruritus, rash, abrasions, blisters, bruising and skin lesion.; ; Neuro: Negative for headache, lightheadedness and neck stiffness. Negative for weakness, altered level of consciousness, altered mental status, extremity weakness, paresthesias, involuntary movement, seizure and syncope.       Physical Exam Updated Vital Signs BP  136/85   Pulse 65   Temp 98.4 F (36.9 C) (Oral)   Resp 14   Ht 5\' 9"  (1.753 m)   Wt 167 lb (75.8 kg)   SpO2 100%   BMI 24.66 kg/m   Patient Vitals for the past 24 hrs:  BP Temp Temp src Pulse Resp SpO2 Height Weight  08/02/16 1430 136/85 - - 65 14 100 % - -  08/02/16 1330 115/70 - - 62 (!) 9 98 % - -  08/02/16 1300 121/68 - - 67 12 99 % - -  08/02/16 1230 118/60 - - (!) 58 14 93 % - -  08/02/16 1222 136/87 98.4 F (36.9 C) Oral (!) 59 16 95 % - -  08/02/16 1130 - - - 71 14 100 % - -  08/02/16 1100 137/76 - - 63 - 98 % - -  08/02/16 1045 - - - 65 - 100 % - -  08/02/16 1030 138/85 - - 63 - 100 % - -  08/02/16 0957 (!) 87/59 97.8 F (36.6 C) Oral 76 24 100 % 5\' 9"  (1.753 m) 167 lb (75.8 kg)      Physical Exam 1055: Physical examination:  Nursing notes reviewed; Vital signs and O2 SAT reviewed;  Constitutional: Well developed, Well nourished, In no acute distress; Head:  Normocephalic, atraumatic; Eyes: EOMI, PERRL, No scleral icterus; ENMT: Mouth and pharynx normal, Mucous membranes dry; Neck: Supple, Full range of motion, No lymphadenopathy; Cardiovascular: Regular rate and rhythm, No gallop; Respiratory: Breath sounds clear & equal bilaterally, No wheezes.  Speaking full sentences with ease, Normal respiratory effort/excursion; Chest: Nontender, Movement normal; Abdomen: Soft, +diffuse tenderness to palp. No rebound or guarding. Nondistended, Normal bowel sounds; Genitourinary: No CVA tenderness; Extremities: Pulses normal, No tenderness, No edema, No calf edema or asymmetry.; Neuro: AA&Ox3, +HOH, otherwise  major CN grossly intact. No facial droop. Speech clear. No gross focal motor or sensory deficits in extremities.; Skin: Color normal, Warm, Dry.   ED Treatments / Results  Labs (all labs ordered are listed, but only abnormal results are displayed)   EKG  EKG Interpretation  Date/Time:  Wednesday August 02 2016 11:26:41 EST Ventricular Rate:  65 PR Interval:    QRS  Duration: 104 QT Interval:  435 QTC Calculation: 453 R Axis:   89 Text Interpretation:  Sinus rhythm Borderline right axis deviation Probable anteroseptal infarct, old Baseline wander When compared with ECG of 10/02/2015 No significant change was found Confirmed by [facility]  MD, Nunzio Cory [phone]) on 08/02/2016 11:50:02 AM       Radiology   Procedures Procedures (including critical care time)  Medications Ordered in ED Medications  0.9 %  sodium chloride infusion (not administered)  morphine 4 MG/ML injection 4 mg (4 mg Intravenous Given 08/02/16 1335)  ondansetron (ZOFRAN) injection 4 mg (4 mg Intravenous Given 08/02/16 1015)  0.9 %  sodium chloride infusion ( Intravenous Stopped 08/02/16 1130)  ondansetron (ZOFRAN) injection 4 mg (4 mg Intravenous  Given 08/02/16 1221)  iopamidol (ISOVUE-300) 61 % injection 100 mL (100 mLs Intravenous Contrast Given 08/02/16 1432)     Initial Impression / Assessment and Plan / ED Course  I have reviewed the triage vital signs and the nursing notes.  Pertinent labs & imaging results that were available during my care of the patient were reviewed by me and considered in my medical decision making (see chart for details).  MDM Reviewed: previous chart, nursing note and vitals Reviewed previous: labs and ECG Interpretation: labs, ECG, x-ray and CT scan   Results for orders placed or performed during the hospital encounter of 08/02/16  CBC with Differential  Result Value Ref Range   WBC 9.2 4.0 - 10.5 K/uL   RBC 4.78 4.22 - 5.81 MIL/uL   Hemoglobin 14.1 13.0 - 17.0 g/dL   HCT 42.0 39.0 - 52.0 %   MCV 87.9 78.0 - 100.0 fL   MCH 29.5 26.0 - 34.0 pg   MCHC 33.6 30.0 - 36.0 g/dL   RDW 13.8 11.5 - 15.5 %   Platelets 277 150 - 400 K/uL   Neutrophils Relative % 83 %   Neutro Abs 7.6 1.7 - 7.7 K/uL   Lymphocytes Relative 11 %   Lymphs Abs 1.0 0.7 - 4.0 K/uL   Monocytes Relative 6 %   Monocytes Absolute 0.6 0.1 - 1.0 K/uL   Eosinophils Relative 0 %    Eosinophils Absolute 0.0 0.0 - 0.7 K/uL   Basophils Relative 0 %   Basophils Absolute 0.0 0.0 - 0.1 K/uL  Basic metabolic panel  Result Value Ref Range   Sodium 135 135 - 145 mmol/L   Potassium 3.8 3.5 - 5.1 mmol/L   Chloride 100 (L) 101 - 111 mmol/L   CO2 22 22 - 32 mmol/L   Glucose, Bld 129 (H) 65 - 99 mg/dL   BUN 12 6 - 20 mg/dL   Creatinine, Ser 0.97 0.61 - 1.24 mg/dL   Calcium 10.1 8.9 - 10.3 mg/dL   GFR calc non Af Amer >60 >60 mL/min   GFR calc Af Amer >60 >60 mL/min   Anion gap 13 5 - 15  Lipase, blood  Result Value Ref Range   Lipase 10 (L) 11 - 51 U/L  Hepatic function panel  Result Value Ref Range   Total Protein 7.6 6.5 - 8.1 g/dL   Albumin 4.4 3.5 - 5.0 g/dL   AST 26 15 - 41 U/L   ALT 14 (L) 17 - 63 U/L   Alkaline Phosphatase 62 38 - 126 U/L   Total Bilirubin 0.9 0.3 - 1.2 mg/dL   Bilirubin, Direct 0.1 0.1 - 0.5 mg/dL   Indirect Bilirubin 0.8 0.3 - 0.9 mg/dL  Urinalysis, Routine w reflex microscopic  Result Value Ref Range   Color, Urine YELLOW YELLOW   APPearance CLEAR CLEAR   Specific Gravity, Urine 1.015 1.005 - 1.030   pH 9.0 (H) 5.0 - 8.0   Glucose, UA NEGATIVE NEGATIVE mg/dL   Hgb urine dipstick NEGATIVE NEGATIVE   Bilirubin Urine NEGATIVE NEGATIVE   Ketones, ur 5 (A) NEGATIVE mg/dL   Protein, ur 30 (A) NEGATIVE mg/dL   Nitrite NEGATIVE NEGATIVE   Leukocytes, UA NEGATIVE NEGATIVE   RBC / HPF 0-5 0 - 5 RBC/hpf   WBC, UA 0-5 0 - 5 WBC/hpf   Bacteria, UA NONE SEEN NONE SEEN  Troponin I  Result Value Ref Range   Troponin I <0.03 <0.03 ng/mL   Dg Chest 2 View  Result Date: 08/02/2016 CLINICAL DATA:  Gallstones for 3 weeks. EXAM: CHEST  2 VIEW COMPARISON:  None. FINDINGS: Normal mediastinum and cardiac silhouette. Normal pulmonary vasculature. No evidence of effusion, infiltrate, or pneumothorax. No acute bony abnormality. LEFT nipple shadow noted. Cardiac closure device noted. Lungs are hyperinflated. Chronic scarring at the RIGHT lung apex. Small  nodular density projecting over the LEFT upper lobe / fourth rib. IMPRESSION: Hyperinflated lungs.  No acute findings. Potential LEFT upper lobe pulmonary nodule. Recommend CT thorax without contrast in the outpatient setting. Electronically Signed   By: Suzy Bouchard M.D.   On: 08/02/2016 11:30   Ct Abdomen Pelvis W Contrast Result Date: 08/02/2016 CLINICAL DATA:  Cough and abdominal pain.  Nausea and vomiting. EXAM: CT ABDOMEN AND PELVIS WITH CONTRAST TECHNIQUE: Multidetector CT imaging of the abdomen and pelvis was performed using the standard protocol following bolus administration of intravenous contrast. CONTRAST:  144mL ISOVUE-300 IOPAMIDOL (ISOVUE-300) INJECTION 61% COMPARISON:  04/11/2013 FINDINGS: Motion degraded exam in the upper abdomen. Lower chest:  ASD closure device is partly seen. Hepatobiliary: Stable probable 1 cm cyst in the upper right liver.No evidence of biliary obstruction or stone. Pancreas: Unremarkable. Spleen: Unremarkable. Adrenals/Urinary Tract: Negative adrenals. Advanced left renal atrophy with poor enhancement and chronic perinephric reticulation. Subcentimeter left renal cyst seen inferiorly. Collapsed thick walled left upper urinary collecting system. Gas in the urinary bladder which shows mild nonspecific wall thickening. Stomach/Bowel:  No obstruction. No appendicitis. Vascular/Lymphatic: Scattered atherosclerotic calcification. No acute vascular finding. No mass or adenopathy. Reproductive:Large partly seen hydrocele on the right with internal septation, also noted in 2014. Other: No ascites or pneumoperitoneum. Musculoskeletal: Multilevel lumbar decompression.  No acute finding. IMPRESSION: 1. No definitive cause of symptoms. 2. Gas in the urinary bladder, correlate with urinalysis. 3. Severe left renal atrophy, also seen in 2014. 4. Chronic large right hydrocele. Electronically Signed   By: Monte Fantasia M.D.   On: 08/02/2016 15:01    1540:  SBP 80's on arrival; IVF  bolus given with improvement.  Pt unable to tol PO despite multiple doses of IV meds. Dx and testing d/w pt and family.  Questions answered.  Verb understanding, agreeable to admit. T/C to [facility]. Tamala Julian, case discussed, including:  HPI, pertinent PM/SHx, VS/PE, dx testing, ED course and treatment:  Agreeable to admit, requests to write temporary orders, obtain observation medical bed to team APAdmits.   Final Clinical Impressions(s) / ED Diagnoses   Final diagnoses:  None    New Prescriptions New Prescriptions   No medications on file     Francine Graven, DO 08/06/16 1505

## 2016-08-02 NOTE — ED Notes (Addendum)
Pt refusing to drink his contrast b/c of nausea and pain. edp aware and vo to repeat zofran 4mg  iv. Order read back. Sister in room states she has been trying to get him to drink the contrast.

## 2016-08-02 NOTE — ED Notes (Signed)
Pt urianted appro x200 after cath attempt

## 2016-08-02 NOTE — ED Notes (Signed)
Pt taken to xray 

## 2016-08-02 NOTE — ED Notes (Signed)
Pt drank very little more of contrast and states he does not want any more. CT awre. Unable to [employer]

## 2016-08-02 NOTE — ED Notes (Signed)
In and out cath attempted with no success.unable to get cath past prostate. edp aware. Pt attempting with urinal now

## 2016-08-02 NOTE — H&P (Signed)
History and Physical    Joshua [name] [id number] DOB: Apr 09, 1951 DOA: 08/02/2016  Referring MD/NP/PA: Dr. Rennie Natter PCP: Glo Herring., MD  Patient coming from: Home  Chief Complaint: Vomiting  HPI: Joshua [name] is a 65 y.o. male with medical history significant of COPD, tobacco abuse, chronic back pain, and Mnire's disease; who presents with complaints of nausea and vomiting. Symptoms started sometime last night. Patient reports inability to keep any food or liquids down. Associated symptoms include generalized weakness and inability to ambulate. Over the last week patient had associated symptoms of increased cough. Denies having any fever, chills, diarrhea, loss of consciousness, or focal weakness.  ED Course: Upon admission to the emergency department patient was evaluated and seen to be febrile up to 100.63F, pulse 58-85, respirations 9-24, blood pressure as low as 87/59, and O2 saturations maintained on room air. Lab work was relatively unremarkable. Chest x-ray showed hyperinflation with no other acute findings. Patient was given 1 L of IV fluids, Zofran, and morphine IV while in the ED without relief of symptoms.  Review of Systems: As per HPI otherwise 10 point review of systems negative.   Past Medical History:  Diagnosis Date  . Back pain   . Cancer ([place])   . Chronic pain   . COPD (chronic obstructive pulmonary disease) ([place])   . Meniere's disease     Past Surgical History:  Procedure Laterality Date  . BACK SURGERY    . BLADDER SURGERY    . hard of hearing    . NASAL SINUS SURGERY       reports that he has been smoking Cigarettes.  He uses smokeless tobacco. He reports that he does not drink alcohol or use drugs.  No Known Allergies  History reviewed. No pertinent family history.  Prior to Admission medications   Medication Sig Start Date End Date Taking? Authorizing Provider  albuterol (PROVENTIL HFA;VENTOLIN HFA) 108 (90 BASE) MCG/ACT inhaler Inhale 2  puffs into the lungs every 6 (six) hours as needed for wheezing or shortness of breath.    Yes Historical Provider, MD  cetirizine (ZYRTEC) 10 MG tablet Take 10 mg by mouth daily.   Yes Historical Provider, MD  Coenzyme Q10 (CO Q 10) 100 MG CAPS Take 100 mg by mouth daily.   Yes Historical Provider, MD  DULoxetine (CYMBALTA) 20 MG capsule Take 20 mg by mouth daily.   Yes Historical Provider, MD  morphine (MS CONTIN) 100 MG 12 hr tablet Take 100 mg by mouth 2 (two) times daily as needed for pain.   Yes Historical Provider, MD  oxycodone (ROXICODONE) 30 MG immediate release tablet Take 30 mg by mouth every 6 (six) hours as needed for pain.   Yes Historical Provider, MD    Physical Exam:   Constitutional:Elderly male who appears acutely ill curled in the fetal position on the hospital gurney Vitals:   08/02/16 1300 08/02/16 1330 08/02/16 1430 08/02/16 1608  BP: 121/68 115/70 136/85 145/71  Pulse: 67 62 65 76  Resp: 12 (!) 9 14 22   Temp:      TempSrc:      SpO2: 99% 98% 100% 96%  Weight:      Height:       Eyes: PERRL, lids and conjunctivae normal ENMT: Mucous membranes are dry. Posterior pharynx clear of any exudate or lesions.  Neck: normal, supple, no masses, no thyromegaly Respiratory: clear to auscultation bilaterally with decreased overall aeration. Increased AP diameter chest. ormal respiratory effort. No accessory  muscle use.  Cardiovascular: Regular rate and rhythm, no murmurs / rubs / gallops. No extremity edema. 2+ pedal pulses. No carotid bruits.  Abdomen: no tenderness, no masses palpated. No hepatosplenomegaly. Bowel sounds positive.  Musculoskeletal: no clubbing / cyanosis. No joint deformity upper and lower extremities. Good ROM, no contractures. Normal muscle tone.  Skin: Poor skin turgor, no rashes, lesions, ulcers. No induration Neurologic: CN 2-12 grossly intact. Sensation intact, DTR normal. Strength 5/5 in all 4.  Psychiatric: Normal judgment and insight. Alert and  oriented x 3. Normal mood.     Labs on Admission: I have personally reviewed following labs and imaging studies  CBC:  Recent Labs Lab 08/02/16 1015  WBC 9.2  NEUTROABS 7.6  HGB 14.1  HCT 42.0  MCV 87.9  PLT 99991111   Basic Metabolic Panel:  Recent Labs Lab 08/02/16 1015  NA 135  K 3.8  CL 100*  CO2 22  GLUCOSE 129*  BUN 12  CREATININE 0.97  CALCIUM 10.1   GFR: Estimated Creatinine Clearance: 76.9 mL/min (by C-G formula based on SCr of 0.97 mg/dL). Liver Function Tests:  Recent Labs Lab 08/02/16 1015  AST 26  ALT 14*  ALKPHOS 62  BILITOT 0.9  PROT 7.6  ALBUMIN 4.4    Recent Labs Lab 08/02/16 1015  LIPASE 10*   No results for input(s): AMMONIA in the last 168 hours. Coagulation Profile: No results for input(s): INR, PROTIME in the last 168 hours. Cardiac Enzymes:  Recent Labs Lab 08/02/16 1015  TROPONINI <0.03   BNP (last 3 results) No results for input(s): PROBNP in the last 8760 hours. HbA1C: No results for input(s): HGBA1C in the last 72 hours. CBG: No results for input(s): GLUCAP in the last 168 hours. Lipid Profile: No results for input(s): CHOL, HDL, LDLCALC, TRIG, CHOLHDL, LDLDIRECT in the last 72 hours. Thyroid Function Tests: No results for input(s): TSH, T4TOTAL, FREET4, T3FREE, THYROIDAB in the last 72 hours. Anemia Panel: No results for input(s): VITAMINB12, FOLATE, FERRITIN, TIBC, IRON, RETICCTPCT in the last 72 hours. Urine analysis:    Component Value Date/Time   COLORURINE YELLOW 08/02/2016 1057   APPEARANCEUR CLEAR 08/02/2016 1057   LABSPEC 1.015 08/02/2016 1057   PHURINE 9.0 (H) 08/02/2016 1057   GLUCOSEU NEGATIVE 08/02/2016 1057   HGBUR NEGATIVE 08/02/2016 1057   BILIRUBINUR NEGATIVE 08/02/2016 1057   KETONESUR 5 (A) 08/02/2016 1057   PROTEINUR 30 (A) 08/02/2016 1057   UROBILINOGEN 1.0 02/20/2013 1252   NITRITE NEGATIVE 08/02/2016 1057   LEUKOCYTESUR NEGATIVE 08/02/2016 1057   Sepsis Labs: No results found for  this or any previous visit (from the past 240 hour(s)).   Radiological Exams on Admission: Dg Chest 2 View  Result Date: 08/02/2016 CLINICAL DATA:  Gallstones for 3 weeks. EXAM: CHEST  2 VIEW COMPARISON:  None. FINDINGS: Normal mediastinum and cardiac silhouette. Normal pulmonary vasculature. No evidence of effusion, infiltrate, or pneumothorax. No acute bony abnormality. LEFT nipple shadow noted. Cardiac closure device noted. Lungs are hyperinflated. Chronic scarring at the RIGHT lung apex. Small nodular density projecting over the LEFT upper lobe / fourth rib. IMPRESSION: Hyperinflated lungs.  No acute findings. Potential LEFT upper lobe pulmonary nodule. Recommend CT thorax without contrast in the outpatient setting. Electronically Signed   By: Suzy Bouchard M.D.   On: 08/02/2016 11:30   Ct Abdomen Pelvis W Contrast  Result Date: 08/02/2016 CLINICAL DATA:  Cough and abdominal pain.  Nausea and vomiting. EXAM: CT ABDOMEN AND PELVIS WITH CONTRAST TECHNIQUE: Multidetector CT  imaging of the abdomen and pelvis was performed using the standard protocol following bolus administration of intravenous contrast. CONTRAST:  178mL ISOVUE-300 IOPAMIDOL (ISOVUE-300) INJECTION 61% COMPARISON:  04/11/2013 FINDINGS: Motion degraded exam in the upper abdomen. Lower chest:  ASD closure device is partly seen. Hepatobiliary: Stable probable 1 cm cyst in the upper right liver.No evidence of biliary obstruction or stone. Pancreas: Unremarkable. Spleen: Unremarkable. Adrenals/Urinary Tract: Negative adrenals. Advanced left renal atrophy with poor enhancement and chronic perinephric reticulation. Subcentimeter left renal cyst seen inferiorly. Collapsed thick walled left upper urinary collecting system. Gas in the urinary bladder which shows mild nonspecific wall thickening. Stomach/Bowel:  No obstruction. No appendicitis. Vascular/Lymphatic: Scattered atherosclerotic calcification. No acute vascular finding. No mass or  adenopathy. Reproductive:Large partly seen hydrocele on the right with internal septation, also noted in 2014. Other: No ascites or pneumoperitoneum. Musculoskeletal: Multilevel lumbar decompression.  No acute finding. IMPRESSION: 1. No definitive cause of symptoms. 2. Gas in the urinary bladder, correlate with urinalysis. 3. Severe left renal atrophy, also seen in 2014. 4. Chronic large right hydrocele. Electronically Signed   By: Monte Fantasia M.D.   On: 08/02/2016 15:01    EKG: Independently reviewed. Sinus rhythm unchanged from previous EKGs  Assessment/Plan Intractable nausea and vomiting: Acute. Symptoms started last night. Suspect symptoms could be secondary to a viral gastroenteritis. Urinalysis negative for any signs of infection. CT scan of abdomen negative for any acute abnormality. - Admit to a MedSurg bed  - IVF NS at 100 ml/hr  - Zofran/Phenergan IV when necessary nausea vomiting - Clear liquid diet, advance diet as tolerated to heart healthy  Fever: Temperature elevated up to 100.60F on admission. Patient noted to have complaints of cough over the last week. - Check respiratory viral panel - Droplet precautions  History of pulmonary nodule: Again seen on chest x-ray. Patient has not had a CT scan of the chest performed. Clinical history of nausea/vomiting, persistent cough, tobacco abuse, low-grade fever 100.60F,  history of right upper lobe pulmonary nodule previously, and possible, now left upper lobe  Nodule. - Check CT scan of the chest  Transient hypotension 2/2 dehydration: Resolved. Patient blood pressures improved after given 1 L of IV fluids. - Continue IV fluids overnight  Chronic back pain - Continue morphine, oxycodone prn  COPD  - albuterol nebs prn SOB /Wheezing  Tobacco abuse - Counseled patient on the need of cessation of tobacco   DVT prophylaxis: lovenox  Code Status: Full Family Communication: No family present at bedside Disposition Plan: Likely  discharge home once medically stable Consults called: none Admission status: Observation  Norval Morton MD [facility] Pager [phone]  If 7PM-7AM, please contact night-coverage [email or web address] Password [facility]  08/02/2016, 5:13 PM

## 2016-08-03 DIAGNOSIS — G8929 Other chronic pain: Secondary | ICD-10-CM | POA: Diagnosis not present

## 2016-08-03 DIAGNOSIS — R509 Fever, unspecified: Secondary | ICD-10-CM

## 2016-08-03 DIAGNOSIS — R112 Nausea with vomiting, unspecified: Secondary | ICD-10-CM | POA: Diagnosis not present

## 2016-08-03 DIAGNOSIS — Z720 Tobacco use: Secondary | ICD-10-CM

## 2016-08-03 DIAGNOSIS — J449 Chronic obstructive pulmonary disease, unspecified: Secondary | ICD-10-CM

## 2016-08-03 LAB — RESPIRATORY PANEL BY PCR
ADENOVIRUS-RVPPCR: NOT DETECTED
Bordetella pertussis: NOT DETECTED
CORONAVIRUS 229E-RVPPCR: NOT DETECTED
CORONAVIRUS NL63-RVPPCR: NOT DETECTED
CORONAVIRUS OC43-RVPPCR: NOT DETECTED
Chlamydophila pneumoniae: NOT DETECTED
Coronavirus HKU1: NOT DETECTED
INFLUENZA B-RVPPCR: NOT DETECTED
Influenza A: NOT DETECTED
MYCOPLASMA PNEUMONIAE-RVPPCR: NOT DETECTED
Metapneumovirus: NOT DETECTED
PARAINFLUENZA VIRUS 1-RVPPCR: NOT DETECTED
Parainfluenza Virus 2: NOT DETECTED
Parainfluenza Virus 3: NOT DETECTED
Parainfluenza Virus 4: NOT DETECTED
Respiratory Syncytial Virus: NOT DETECTED
Rhinovirus / Enterovirus: NOT DETECTED

## 2016-08-03 LAB — BASIC METABOLIC PANEL
Anion gap: 7 (ref 5–15)
BUN: 13 mg/dL (ref 6–20)
CALCIUM: 9 mg/dL (ref 8.9–10.3)
CO2: 24 mmol/L (ref 22–32)
CREATININE: 0.78 mg/dL (ref 0.61–1.24)
Chloride: 105 mmol/L (ref 101–111)
Glucose, Bld: 114 mg/dL — ABNORMAL HIGH (ref 65–99)
Potassium: 3.3 mmol/L — ABNORMAL LOW (ref 3.5–5.1)
SODIUM: 136 mmol/L (ref 135–145)

## 2016-08-03 LAB — CBC
HCT: 35.8 % — ABNORMAL LOW (ref 39.0–52.0)
HEMOGLOBIN: 12.2 g/dL — AB (ref 13.0–17.0)
MCH: 29.8 pg (ref 26.0–34.0)
MCHC: 34.1 g/dL (ref 30.0–36.0)
MCV: 87.3 fL (ref 78.0–100.0)
Platelets: 261 10*3/uL (ref 150–400)
RBC: 4.1 MIL/uL — ABNORMAL LOW (ref 4.22–5.81)
RDW: 14.1 % (ref 11.5–15.5)
WBC: 9.6 10*3/uL (ref 4.0–10.5)

## 2016-08-03 MED ORDER — AZITHROMYCIN 250 MG PO TABS: ORAL_TABLET | ORAL | 0 refills | Status: DC

## 2016-08-03 MED ORDER — GUAIFENESIN ER 600 MG PO TB12: 600.0000 mg | ORAL_TABLET | Freq: Two times a day (BID) | ORAL | 0 refills | Status: DC

## 2016-08-03 MED ORDER — POTASSIUM CHLORIDE CRYS ER 20 MEQ PO TBCR
40.0000 meq | EXTENDED_RELEASE_TABLET | Freq: Once | ORAL | Status: AC
  Administered 2016-08-03: 40 meq via ORAL
  Filled 2016-08-03: qty 2

## 2016-08-03 NOTE — Discharge Summary (Signed)
Physician Discharge Summary  Joshua [name] [id number] DOB: 07-01-1950 DOA: 08/02/2016  PCP: Glo Herring., MD  Admit date: 08/02/2016 Discharge date: 08/03/2016  Admitted From: Home Disposition:  Home  Recommendations for Outpatient Follow-up:  1. Follow up with PCP in 1-2 weeks 2. Please obtain BMP/CBC in one week   Discharge Condition:Stable CODE STATUS:Full code Diet recommendation: Heart Healthy   Brief/Interim Summary: 66 year old male with history of COPD and tobacco use, presents to the hospital with complaints of nausea and vomiting. He also had a productive cough. He was unable to keep anything down by mouth. He was noted to be dehydrated in the emergency room noted by hypotension. He received IV fluids with improvement of blood pressure. He'll mild temperature of 100.9, but no further fevers. He underwent CT scan of the abdomen and pelvis that did not show any acute findings. CT chest did not show any evidence of pneumonia. Respiratory virus panel was found to be negative. It is possible that he could've had an acute bronchitis. He'll be prescribed a course of azithromycin. Continue on mucolytics. Diet was slowly advanced and he was given antiemetics. He is tolerating liquids at this time and feels that he can further advance the diet at home. Patient is feeling significantly improved after hydration and no longer has any complaints. He feels ready to discharge home.  Discharge Diagnoses:  Principal Problem:   Intractable nausea and vomiting Active Problems:   Tobacco abuse   COPD (chronic obstructive pulmonary disease) (HCC)   Chronic pain   Fever   Transient hypotension    Discharge Instructions  Discharge Instructions    Diet - low sodium heart healthy    Complete by:  As directed    Increase activity slowly    Complete by:  As directed      Allergies as of 08/03/2016   No Known Allergies     Medication List    TAKE these medications   albuterol 108  (90 Base) MCG/ACT inhaler Commonly known as:  PROVENTIL HFA;VENTOLIN HFA Inhale 2 puffs into the lungs every 6 (six) hours as needed for wheezing or shortness of breath.   azithromycin 250 MG tablet Commonly known as:  ZITHROMAX Z-PAK Take 500mg  po on day 1 and then 250mg  po daily until complete   cetirizine 10 MG tablet Commonly known as:  ZYRTEC Take 10 mg by mouth daily.   Co Q 10 100 MG Caps Take 100 mg by mouth daily.   DULoxetine 20 MG capsule Commonly known as:  CYMBALTA Take 20 mg by mouth daily.   guaiFENesin 600 MG 12 hr tablet Commonly known as:  MUCINEX Take 1 tablet (600 mg total) by mouth 2 (two) times daily.   morphine 100 MG 12 hr tablet Commonly known as:  MS CONTIN Take 100 mg by mouth 2 (two) times daily as needed for pain.   oxycodone 30 MG immediate release tablet Commonly known as:  ROXICODONE Take 30 mg by mouth every 6 (six) hours as needed for pain.       No Known Allergies  Consultations:     Procedures/Studies: Dg Chest 2 View  Result Date: 08/02/2016 CLINICAL DATA:  Gallstones for 3 weeks. EXAM: CHEST  2 VIEW COMPARISON:  None. FINDINGS: Normal mediastinum and cardiac silhouette. Normal pulmonary vasculature. No evidence of effusion, infiltrate, or pneumothorax. No acute bony abnormality. LEFT nipple shadow noted. Cardiac closure device noted. Lungs are hyperinflated. Chronic scarring at the RIGHT lung apex. Small nodular density projecting over  the LEFT upper lobe / fourth rib. IMPRESSION: Hyperinflated lungs.  No acute findings. Potential LEFT upper lobe pulmonary nodule. Recommend CT thorax without contrast in the outpatient setting. Electronically Signed   By: Suzy Bouchard M.D.   On: 08/02/2016 11:30   Ct Chest Wo Contrast  Result Date: 08/02/2016 CLINICAL DATA:  COPD and smoking history. Nodule seen on chest radiograph. EXAM: CT CHEST WITHOUT CONTRAST TECHNIQUE: Multidetector CT imaging of the chest was performed following the  standard protocol without IV contrast. COMPARISON:  Chest radiograph 08/02/2016 FINDINGS: Cardiovascular: There is atherosclerotic calcification of the aortic arch, which is a normal 3 vessel branching pattern. There are coronary artery calcifications. Heart size is normal with no pericardial effusion. Mediastinum/Nodes: No mediastinal, hilar or axillary lymphadenopathy. The visualized thyroid and thoracic esophageal course are unremarkable. Lungs/Pleura: There is severe bilateral emphysema. The images of the lungs are somewhat degraded by respiratory motion. There is no pleural effusion or pneumothorax. No focal area of consolidation. I do not see a discrete nodule to correspond to the opacity seen on the chest radiograph performed earlier same day. Perhaps the opacity is caused by the pleuroparenchymal scarring in the left apex. Upper Abdomen: There is hyperdense material within the gallbladder, consistent with vicarious excretion of contrast media from the CT abdomen pelvis performed earlier the same day. The visualized upper abdominal structures are otherwise normal. Musculoskeletal: There is no bony spinal canal stenosis. No lytic or blastic osseous lesions. No rib fracture. IMPRESSION: 1. No pulmonary nodule or mass to correspond to the opacity identified on chest radiograph. 2. Severe bilateral emphysema. 3. Aortic and coronary artery atherosclerosis. Electronically Signed   By: Ulyses Jarred M.D.   On: 08/02/2016 22:34   Ct Abdomen Pelvis W Contrast  Result Date: 08/02/2016 CLINICAL DATA:  Cough and abdominal pain.  Nausea and vomiting. EXAM: CT ABDOMEN AND PELVIS WITH CONTRAST TECHNIQUE: Multidetector CT imaging of the abdomen and pelvis was performed using the standard protocol following bolus administration of intravenous contrast. CONTRAST:  19mL ISOVUE-300 IOPAMIDOL (ISOVUE-300) INJECTION 61% COMPARISON:  04/11/2013 FINDINGS: Motion degraded exam in the upper abdomen. Lower chest:  ASD closure  device is partly seen. Hepatobiliary: Stable probable 1 cm cyst in the upper right liver.No evidence of biliary obstruction or stone. Pancreas: Unremarkable. Spleen: Unremarkable. Adrenals/Urinary Tract: Negative adrenals. Advanced left renal atrophy with poor enhancement and chronic perinephric reticulation. Subcentimeter left renal cyst seen inferiorly. Collapsed thick walled left upper urinary collecting system. Gas in the urinary bladder which shows mild nonspecific wall thickening. Stomach/Bowel:  No obstruction. No appendicitis. Vascular/Lymphatic: Scattered atherosclerotic calcification. No acute vascular finding. No mass or adenopathy. Reproductive:Large partly seen hydrocele on the right with internal septation, also noted in 2014. Other: No ascites or pneumoperitoneum. Musculoskeletal: Multilevel lumbar decompression.  No acute finding. IMPRESSION: 1. No definitive cause of symptoms. 2. Gas in the urinary bladder, correlate with urinalysis. 3. Severe left renal atrophy, also seen in 2014. 4. Chronic large right hydrocele. Electronically Signed   By: Monte Fantasia M.D.   On: 08/02/2016 15:01       Subjective: Feeling better. Tolerating liquids. No further vomiting. Has a productive cough.  Discharge Exam: Vitals:   08/03/16 0641 08/03/16 1446  BP: 136/67 114/65  Pulse: 78 77  Resp: 18 18  Temp: 99.5 F (37.5 C) 98.9 F (37.2 C)   Vitals:   08/02/16 1733 08/02/16 2043 08/03/16 0641 08/03/16 1446  BP: (!) 155/85 138/72 136/67 114/65  Pulse: 66 85 78 77  Resp:  18 18 18 18   Temp: 99.5 F (37.5 C) (!) 100.9 F (38.3 C) 99.5 F (37.5 C) 98.9 F (37.2 C)  TempSrc: Oral Oral Oral Oral  SpO2: 100% 98% 99% 97%  Weight: 73 kg (160 lb 15 oz)     Height: 5\' 11"  (1.803 m)       General: Pt is alert, awake, not in acute distress Cardiovascular: RRR, S1/S2 +, no rubs, no gallops Respiratory: CTA bilaterally, no wheezing, no rhonchi Abdominal: Soft, NT, ND, bowel sounds  + Extremities: no edema, no cyanosis    The results of significant diagnostics from this hospitalization (including imaging, microbiology, ancillary and laboratory) are listed below for reference.     Microbiology: Recent Results (from the past 240 hour(s))  Respiratory Panel by PCR     Status: None   Collection Time: 08/02/16  9:16 PM  Result Value Ref Range Status   Adenovirus NOT DETECTED NOT DETECTED Final   Coronavirus 229E NOT DETECTED NOT DETECTED Final   Coronavirus HKU1 NOT DETECTED NOT DETECTED Final   Coronavirus NL63 NOT DETECTED NOT DETECTED Final   Coronavirus OC43 NOT DETECTED NOT DETECTED Final   Metapneumovirus NOT DETECTED NOT DETECTED Final   Rhinovirus / Enterovirus NOT DETECTED NOT DETECTED Final   Influenza A NOT DETECTED NOT DETECTED Final   Influenza B NOT DETECTED NOT DETECTED Final   Parainfluenza Virus 1 NOT DETECTED NOT DETECTED Final   Parainfluenza Virus 2 NOT DETECTED NOT DETECTED Final   Parainfluenza Virus 3 NOT DETECTED NOT DETECTED Final   Parainfluenza Virus 4 NOT DETECTED NOT DETECTED Final   Respiratory Syncytial Virus NOT DETECTED NOT DETECTED Final   Bordetella pertussis NOT DETECTED NOT DETECTED Final   Chlamydophila pneumoniae NOT DETECTED NOT DETECTED Final   Mycoplasma pneumoniae NOT DETECTED NOT DETECTED Final    Comment: Performed at [facility], [address], [place], [place] [address]     Labs: BNP (last 3 results) No results for input(s): BNP in the last 8760 hours. Basic Metabolic Panel:  Recent Labs Lab 08/02/16 1015 08/03/16 0605  NA 135 136  K 3.8 3.3*  CL 100* 105  CO2 22 24  GLUCOSE 129* 114*  BUN 12 13  CREATININE 0.97 0.78  CALCIUM 10.1 9.0   Liver Function Tests:  Recent Labs Lab 08/02/16 1015  AST 26  ALT 14*  ALKPHOS 62  BILITOT 0.9  PROT 7.6  ALBUMIN 4.4    Recent Labs Lab 08/02/16 1015  LIPASE 10*   No results for input(s): AMMONIA in the last 168 hours. CBC:  Recent  Labs Lab 08/02/16 1015 08/03/16 0605  WBC 9.2 9.6  NEUTROABS 7.6  --   HGB 14.1 12.2*  HCT 42.0 35.8*  MCV 87.9 87.3  PLT 277 261   Cardiac Enzymes:  Recent Labs Lab 08/02/16 1015  TROPONINI <0.03   BNP: Invalid input(s): POCBNP CBG: No results for input(s): GLUCAP in the last 168 hours. D-Dimer No results for input(s): DDIMER in the last 72 hours. Hgb A1c No results for input(s): HGBA1C in the last 72 hours. Lipid Profile No results for input(s): CHOL, HDL, LDLCALC, TRIG, CHOLHDL, LDLDIRECT in the last 72 hours. Thyroid function studies No results for input(s): TSH, T4TOTAL, T3FREE, THYROIDAB in the last 72 hours.  Invalid input(s): FREET3 Anemia work up No results for input(s): VITAMINB12, FOLATE, FERRITIN, TIBC, IRON, RETICCTPCT in the last 72 hours. Urinalysis    Component Value Date/Time   COLORURINE YELLOW 08/02/2016 1057  APPEARANCEUR CLEAR 08/02/2016 1057   LABSPEC 1.015 08/02/2016 1057   PHURINE 9.0 (H) 08/02/2016 1057   GLUCOSEU NEGATIVE 08/02/2016 1057   HGBUR NEGATIVE 08/02/2016 1057   BILIRUBINUR NEGATIVE 08/02/2016 1057   KETONESUR 5 (A) 08/02/2016 1057   PROTEINUR 30 (A) 08/02/2016 1057   UROBILINOGEN 1.0 02/20/2013 1252   NITRITE NEGATIVE 08/02/2016 1057   LEUKOCYTESUR NEGATIVE 08/02/2016 1057   Sepsis Labs Invalid input(s): PROCALCITONIN,  WBC,  LACTICIDVEN Microbiology Recent Results (from the past 240 hour(s))  Respiratory Panel by PCR     Status: None   Collection Time: 08/02/16  9:16 PM  Result Value Ref Range Status   Adenovirus NOT DETECTED NOT DETECTED Final   Coronavirus 229E NOT DETECTED NOT DETECTED Final   Coronavirus HKU1 NOT DETECTED NOT DETECTED Final   Coronavirus NL63 NOT DETECTED NOT DETECTED Final   Coronavirus OC43 NOT DETECTED NOT DETECTED Final   Metapneumovirus NOT DETECTED NOT DETECTED Final   Rhinovirus / Enterovirus NOT DETECTED NOT DETECTED Final   Influenza A NOT DETECTED NOT DETECTED Final   Influenza B NOT  DETECTED NOT DETECTED Final   Parainfluenza Virus 1 NOT DETECTED NOT DETECTED Final   Parainfluenza Virus 2 NOT DETECTED NOT DETECTED Final   Parainfluenza Virus 3 NOT DETECTED NOT DETECTED Final   Parainfluenza Virus 4 NOT DETECTED NOT DETECTED Final   Respiratory Syncytial Virus NOT DETECTED NOT DETECTED Final   Bordetella pertussis NOT DETECTED NOT DETECTED Final   Chlamydophila pneumoniae NOT DETECTED NOT DETECTED Final   Mycoplasma pneumoniae NOT DETECTED NOT DETECTED Final    Comment: Performed at [facility], [place] [address], [place], [place] [address]     Time coordinating discharge: Over 30 minutes  SIGNED:   Kathie Dike, MD  [facility] 08/03/2016, 4:05 PM Pager   If 7PM-7AM, please contact night-coverage [email or web address] Password [email or web address]

## 2016-08-03 NOTE — Progress Notes (Signed)
Patient with orders to be discharge home. Discharge instructions given, patient and family verbalized understanding. Patient stable. Patient left in private vehicle with family.  

## 2016-08-03 NOTE — Care Management Obs Status (Signed)
[facility] NOTIFICATION   Patient Details  Name: UGOCHUKWU NORUM MRN: [id number] Date of Birth: 10/09/1950   Medicare Observation Status Notification Given:  Yes    Adyn Serna, Chauncey Reading, RN 08/03/2016, 3:52 PM

## 2016-08-03 NOTE — Care Management Note (Signed)
Case Management Note  Patient Details  Name: Joshua [name] MRN: [id number] Date of Birth: 1950/12/02  Subjective/Objective:    Patient adm from home with intractabe N/V. He lives with family, has cane if needed. He has a PCP, transportation to appointments and insurance with prescription coverage. Sisters at bedside.            Action/Plan: Anticipate DC home with self care.    Expected Discharge Date:       08/04/2016           Expected Discharge Plan:  Home/Self Care  In-House Referral:     Discharge planning Services  CM Consult  Post Acute Care Choice:  NA Choice offered to:  NA  DME Arranged:    DME Agency:     HH Arranged:    HH Agency:     Status of Service:  Completed, signed off  If discussed at [employer] of Stay Meetings, dates discussed:    Additional Comments:  Hughey Rittenberry, Chauncey Reading, RN 08/03/2016, 3:49 PM

## 2016-08-04 LAB — HIV ANTIBODY (ROUTINE TESTING W REFLEX): HIV SCREEN 4TH GENERATION: NONREACTIVE

## 2016-08-04 LAB — URINE CULTURE: CULTURE: NO GROWTH

## 2017-07-24 ENCOUNTER — Other Ambulatory Visit (HOSPITAL_COMMUNITY)
Admission: RE | Admit: 2017-07-24 | Discharge: 2017-07-24 | Disposition: A | Discharge status: Home / Self Care | Payer: Medicare HMO | Source: Ambulatory Visit | Attending: Urology | Admitting: Urology

## 2017-07-24 ENCOUNTER — Ambulatory Visit: Payer: Medicare HMO | Admitting: Urology

## 2017-07-24 DIAGNOSIS — C679 Malignant neoplasm of bladder, unspecified: Secondary | ICD-10-CM | POA: Diagnosis not present

## 2017-07-24 DIAGNOSIS — C678 Malignant neoplasm of overlapping sites of bladder: Secondary | ICD-10-CM | POA: Insufficient documentation

## 2017-07-24 DIAGNOSIS — R3912 Poor urinary stream: Secondary | ICD-10-CM

## 2017-07-24 DIAGNOSIS — N430 Encysted hydrocele: Secondary | ICD-10-CM

## 2017-07-25 DIAGNOSIS — M545 Low back pain: Secondary | ICD-10-CM | POA: Diagnosis not present

## 2017-08-09 DIAGNOSIS — Z1389 Encounter for screening for other disorder: Secondary | ICD-10-CM | POA: Diagnosis not present

## 2017-08-09 DIAGNOSIS — R5383 Other fatigue: Secondary | ICD-10-CM | POA: Diagnosis not present

## 2017-08-09 DIAGNOSIS — Z6822 Body mass index (BMI) 22.0-22.9, adult: Secondary | ICD-10-CM | POA: Diagnosis not present

## 2017-08-09 DIAGNOSIS — M1991 Primary osteoarthritis, unspecified site: Secondary | ICD-10-CM | POA: Diagnosis not present

## 2017-08-09 DIAGNOSIS — G894 Chronic pain syndrome: Secondary | ICD-10-CM | POA: Diagnosis not present

## 2017-08-09 DIAGNOSIS — R69 Illness, unspecified: Secondary | ICD-10-CM | POA: Diagnosis not present

## 2017-08-09 DIAGNOSIS — D649 Anemia, unspecified: Secondary | ICD-10-CM | POA: Diagnosis not present

## 2017-08-09 DIAGNOSIS — C678 Malignant neoplasm of overlapping sites of bladder: Secondary | ICD-10-CM | POA: Diagnosis not present

## 2017-08-09 DIAGNOSIS — Z79891 Long term (current) use of opiate analgesic: Secondary | ICD-10-CM | POA: Diagnosis not present

## 2017-08-13 DIAGNOSIS — E2740 Unspecified adrenocortical insufficiency: Secondary | ICD-10-CM | POA: Diagnosis not present

## 2017-08-13 DIAGNOSIS — D649 Anemia, unspecified: Secondary | ICD-10-CM | POA: Diagnosis not present

## 2017-08-16 DIAGNOSIS — Z1211 Encounter for screening for malignant neoplasm of colon: Secondary | ICD-10-CM | POA: Diagnosis not present

## 2017-08-23 ENCOUNTER — Encounter (INDEPENDENT_AMBULATORY_CARE_PROVIDER_SITE_OTHER): Payer: Self-pay | Admitting: *Deleted

## 2017-09-22 ENCOUNTER — Emergency Department (HOSPITAL_COMMUNITY): Payer: Medicare HMO

## 2017-09-22 ENCOUNTER — Emergency Department (HOSPITAL_COMMUNITY)
Admission: EM | Admit: 2017-09-22 | Discharge: 2017-09-22 | Disposition: A | Discharge status: Home / Self Care | Payer: Medicare HMO | Attending: Emergency Medicine | Admitting: Emergency Medicine

## 2017-09-22 ENCOUNTER — Encounter (HOSPITAL_COMMUNITY): Payer: Self-pay | Admitting: Emergency Medicine

## 2017-09-22 DIAGNOSIS — S61215A Laceration without foreign body of left ring finger without damage to nail, initial encounter: Secondary | ICD-10-CM | POA: Diagnosis not present

## 2017-09-22 DIAGNOSIS — Y9389 Activity, other specified: Secondary | ICD-10-CM | POA: Diagnosis not present

## 2017-09-22 DIAGNOSIS — Y999 Unspecified external cause status: Secondary | ICD-10-CM | POA: Diagnosis not present

## 2017-09-22 DIAGNOSIS — R69 Illness, unspecified: Secondary | ICD-10-CM | POA: Diagnosis not present

## 2017-09-22 DIAGNOSIS — Y929 Unspecified place or not applicable: Secondary | ICD-10-CM | POA: Insufficient documentation

## 2017-09-22 DIAGNOSIS — W312XXA Contact with powered woodworking and forming machines, initial encounter: Secondary | ICD-10-CM | POA: Insufficient documentation

## 2017-09-22 DIAGNOSIS — Z8551 Personal history of malignant neoplasm of bladder: Secondary | ICD-10-CM | POA: Diagnosis not present

## 2017-09-22 DIAGNOSIS — J449 Chronic obstructive pulmonary disease, unspecified: Secondary | ICD-10-CM | POA: Diagnosis not present

## 2017-09-22 DIAGNOSIS — S6992XA Unspecified injury of left wrist, hand and finger(s), initial encounter: Secondary | ICD-10-CM

## 2017-09-22 DIAGNOSIS — S61412A Laceration without foreign body of left hand, initial encounter: Secondary | ICD-10-CM | POA: Diagnosis not present

## 2017-09-22 DIAGNOSIS — Z23 Encounter for immunization: Secondary | ICD-10-CM | POA: Diagnosis not present

## 2017-09-22 DIAGNOSIS — I10 Essential (primary) hypertension: Secondary | ICD-10-CM | POA: Diagnosis not present

## 2017-09-22 DIAGNOSIS — Z79899 Other long term (current) drug therapy: Secondary | ICD-10-CM | POA: Diagnosis not present

## 2017-09-22 DIAGNOSIS — F17210 Nicotine dependence, cigarettes, uncomplicated: Secondary | ICD-10-CM | POA: Diagnosis not present

## 2017-09-22 DIAGNOSIS — S61313A Laceration without foreign body of left middle finger with damage to nail, initial encounter: Secondary | ICD-10-CM | POA: Diagnosis not present

## 2017-09-22 HISTORY — DX: Cerebral infarction, unspecified: I63.9

## 2017-09-22 MED ORDER — TETANUS-DIPHTH-ACELL PERTUSSIS 5-2.5-18.5 LF-MCG/0.5 IM SUSP
0.5000 mL | Freq: Once | INTRAMUSCULAR | Status: AC
  Administered 2017-09-22: 0.5 mL via INTRAMUSCULAR
  Filled 2017-09-22: qty 0.5

## 2017-09-22 MED ORDER — HYDROCODONE-ACETAMINOPHEN 5-325 MG PO TABS: 1.0000 | ORAL_TABLET | ORAL | 0 refills | Status: DC | PRN

## 2017-09-22 MED ORDER — CEPHALEXIN 500 MG PO CAPS: 500.0000 mg | ORAL_CAPSULE | Freq: Four times a day (QID) | ORAL | 0 refills | Status: DC

## 2017-09-22 MED ORDER — CEPHALEXIN 500 MG PO CAPS
500.0000 mg | ORAL_CAPSULE | Freq: Once | ORAL | Status: AC
  Administered 2017-09-22: 500 mg via ORAL
  Filled 2017-09-22: qty 1

## 2017-09-22 MED ORDER — BACITRACIN-NEOMYCIN-POLYMYXIN 400-5-5000 EX OINT
TOPICAL_OINTMENT | Freq: Once | CUTANEOUS | Status: AC
  Administered 2017-09-22: 1 via TOPICAL
  Filled 2017-09-22: qty 2

## 2017-09-22 MED ORDER — LIDOCAINE HCL (PF) 1 % IJ SOLN
5.0000 mL | Freq: Once | INTRAMUSCULAR | Status: AC
  Administered 2017-09-22: 5 mL
  Filled 2017-09-22: qty 6

## 2017-09-22 NOTE — ED Triage Notes (Addendum)
Cut lt middle and ring finger with saw, area is bandaged, dressing clean dry and intact.  Pt is extremely hard of hearing, used pen and paper to communicate in triage

## 2017-09-22 NOTE — ED Provider Notes (Signed)
[facility] EMERGENCY DEPARTMENT Provider Note   CSN: [id number] Arrival date & time: 09/22/17  1543     History   Chief Complaint Chief Complaint  Patient presents with  . Finger Injury    HPI Joshua [name] is a 67 y.o. male who presents to the ED with lacerations to the left hand. Patient reports he was working with a table saw and his hand slipped and the blade cut the tips of the middle and ring fingers of the left hand. Patient denies other injuries. Patient reports he tried to clean the wounds prior to coming to the ED and put a dressing on the area.   HPI  Past Medical History:  Diagnosis Date  . Back pain   . 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 Chronic pain   . COPD (chronic obstructive pulmonary disease) ([place])   . Meniere's disease   . Stroke [facility])    30 years ago    Patient Active Problem List   Diagnosis Date Noted  . Intractable nausea and vomiting 08/02/2016  . Fever 08/02/2016  . Transient hypotension 08/02/2016  . Drowsy 10/03/2015  . Sinus bradycardia 10/03/2015  . COPD (chronic obstructive pulmonary disease) ([place])   . Chronic pain   . Polypharmacy   . 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 Postlaminectomy syndrome, lumbar region 11/13/2012  . Thoracic or lumbosacral neuritis or radiculitis, unspecified 11/13/2012  . Lumbosacral spondylosis without myelopathy 11/13/2012  . Tobacco abuse 11/13/2012  . Unspecified hereditary and idiopathic peripheral neuropathy 11/13/2012    Past Surgical History:  Procedure Laterality Date  . BACK SURGERY    . BLADDER SURGERY    . hard of hearing    . NASAL SINUS SURGERY          Home Medications    Prior to Admission medications   Medication Sig Start Date End Date Taking? Authorizing Provider  albuterol (PROVENTIL HFA;VENTOLIN HFA) 108 (90 BASE) MCG/ACT inhaler Inhale 2 puffs into the lungs every 6 (six) hours as needed for wheezing or shortness of breath.     [provider]  azithromycin  (ZITHROMAX Z-PAK) 250 MG tablet Take 500mg  po on day 1 and then 250mg  po daily until complete 08/03/16   Kathie Dike, MD  cephALEXin (KEFLEX) 500 MG capsule Take 1 capsule (500 mg total) by mouth 4 (four) times daily. 09/22/17   Ashley Murrain, NP  cetirizine (ZYRTEC) 10 MG tablet Take 10 mg by mouth daily.    [provider]  Coenzyme Q10 (CO Q 10) 100 MG CAPS Take 100 mg by mouth daily.    [provider]  DULoxetine (CYMBALTA) 20 MG capsule Take 20 mg by mouth daily.    [provider]  guaiFENesin (MUCINEX) 600 MG 12 hr tablet Take 1 tablet (600 mg total) by mouth 2 (two) times daily. 08/03/16   Kathie Dike, MD  HYDROcodone-acetaminophen (NORCO/VICODIN) 5-325 MG tablet Take 1 tablet by mouth every 4 (four) hours as needed. 09/22/17   Ashley Murrain, NP  morphine (MS CONTIN) 100 MG 12 hr tablet Take 100 mg by mouth 2 (two) times daily as needed for pain.    [provider]  oxycodone (ROXICODONE) 30 MG immediate release tablet Take 30 mg by mouth every 6 (six) hours as needed for pain.    [provider]    Family History History reviewed. No pertinent family history.  Social History Social History   Tobacco Use  .  Smoking status: Current Every Day Smoker    Packs/day: 1.00    Types: Cigarettes  . Smokeless tobacco: Current User  Substance Use Topics  . Alcohol use: No  . Drug use: No     Allergies   Patient has no known allergies.   Review of Systems Review of Systems  Skin: Positive for wound.  All other systems reviewed and are negative.    Physical Exam Updated Vital Signs BP 111/71   Pulse 81   Temp 98.6 F (37 C) (Oral)   Resp 16   Ht 5\' 11"  (1.803 m)   Wt 72.6 kg (160 lb)   SpO2 98%   BMI 22.32 kg/m   Physical Exam  Constitutional: He appears well-developed and well-nourished. No distress.  HENT:  Head: Normocephalic and atraumatic.  Eyes: EOM are normal.  Neck: Neck supple.  Cardiovascular: Normal  rate.  Pulmonary/Chest: Effort normal.  Musculoskeletal:       Left hand: He exhibits tenderness and laceration. He exhibits normal range of motion and normal capillary refill. Normal sensation noted. Normal strength noted. He exhibits no thumb/finger opposition.       Hands: The tip of the left ring finger has an avulsion laceration that has stopped bleeding. The left middle finger has a laceration at the tip that involves the nail.   Neurological: He is alert.  Skin: Skin is warm and dry.  Wounds left hand  Psychiatric: He has a normal mood and affect.  Nursing note and vitals reviewed.    ED Treatments / Results  Labs (all labs ordered are listed, but only abnormal results are displayed) Labs Reviewed - No data to display Radiology Dg Hand Complete Left  Result Date: 09/22/2017 CLINICAL DATA:  Laceration with table saw EXAM: LEFT HAND - COMPLETE 3+ VIEW COMPARISON:  None. FINDINGS: No fracture or dislocation is seen. The joint spaces are preserved. Soft tissue laceration along the distal aspect of the 3rd and 4th digits. No radiopaque foreign body is seen. IMPRESSION: Soft tissue laceration along the distal aspect of the 3rd and 4th digits. No fracture, dislocation, or radiopaque foreign body is seen. Electronically Signed   By: Julian Hy M.D.   On: 09/22/2017 17:49    Procedures .Nerve Block Date/Time: 09/22/2017 6:50 PM Performed by: Ashley Murrain, NP Authorized by: Ashley Murrain, NP   Consent:    Consent obtained:  Verbal   Consent given by:  Patient   Risks discussed:  Bleeding and pain   Alternatives discussed:  Alternative treatment Indications:    Indications:  Pain relief and procedural anesthesia Location:    Body area:  Upper extremity   Upper extremity nerve blocked: middle finger.   Laterality:  Left Pre-procedure details:    Skin preparation:  2% chlorhexidine   Preparation: Patient was prepped and draped in usual sterile fashion   Procedure details  (see MAR for exact dosages):    Block needle gauge:  25 G   Anesthetic injected:  Lidocaine 1% w/o epi   Steroid injected:  None   Additive injected:  None   Injection procedure:  Anatomic landmarks identified, incremental injection and negative aspiration for blood Post-procedure details:    Outcome:  Anesthesia achieved   Patient tolerance of procedure:  Tolerated well, no immediate complications Comments:     After procedure the wounds were cleaned and irrigated with NSS. Wound edges trimmed. There was no area that could be brought together with sutures so bacitracin ointment and sterile  pressure dressing applied and finger splint. Tetanus updated.   (including critical care time)  Medications Ordered in ED Medications  neomycin-bacitracin-polymyxin (NEOSPORIN) ointment (has no administration in time range)  cephALEXin (KEFLEX) capsule 500 mg (has no administration in time range)  Tdap (BOOSTRIX) injection 0.5 mL (0.5 mLs Intramuscular Given 09/22/17 1707)  lidocaine (PF) (XYLOCAINE) 1 % injection 5 mL (5 mLs Infiltration Given 09/22/17 1813)   Dr. Lita Mains in to see the patient and discussed that with these wounds there is noting to suture.   Initial Impression / Assessment and Plan / ED Course  I have reviewed the triage vital signs and the nursing notes. 67 y.o. male here with table saw injuries to the fingers of the left hand stable for d/c without focal neuro deficits. Wounds cleaned and dressed. Patient to f/u with PCP in 2 days for wound check. Return precautions discussed with the patient. Patient agrees with plan.   Final Clinical Impressions(s) / ED Diagnoses   Final diagnoses:  Injury of finger of left hand, initial encounter    ED Discharge Orders        Ordered    cephALEXin (KEFLEX) 500 MG capsule  4 times daily,   Status:  Discontinued     09/22/17 1844    HYDROcodone-acetaminophen (NORCO/VICODIN) 5-325 MG tablet  Every 4 hours PRN     09/22/17 1844     cephALEXin (KEFLEX) 500 MG capsule  4 times daily     09/22/17 1849       Debroah Baller [place], NP 09/22/17 1949    Julianne Rice, MD 09/22/17 [phone]

## 2017-09-22 NOTE — ED Notes (Signed)
HN in to assess

## 2017-09-22 NOTE — Discharge Instructions (Addendum)
Your x-ray today shows no broken bones or dislocation in your fingers. Keep the wounds clean. Do not take the narcotic pain medication while driving as it will make you sleepy. You can take ibuprofen in addition to the medications we give you. Follow up with Dr. Gerarda Fraction in 2 days for wound check. Return here sooner for any signs of infection.

## 2017-09-24 MED FILL — Hydrocodone-Acetaminophen Tab 5-325 MG: ORAL | Qty: 6 | Status: AC

## 2017-10-25 DIAGNOSIS — M5136 Other intervertebral disc degeneration, lumbar region: Secondary | ICD-10-CM | POA: Diagnosis not present

## 2017-10-25 DIAGNOSIS — M545 Low back pain: Secondary | ICD-10-CM | POA: Diagnosis not present

## 2017-10-30 DIAGNOSIS — Z6823 Body mass index (BMI) 23.0-23.9, adult: Secondary | ICD-10-CM | POA: Diagnosis not present

## 2017-10-30 DIAGNOSIS — J449 Chronic obstructive pulmonary disease, unspecified: Secondary | ICD-10-CM | POA: Diagnosis not present

## 2017-10-30 DIAGNOSIS — N400 Enlarged prostate without lower urinary tract symptoms: Secondary | ICD-10-CM | POA: Diagnosis not present

## 2017-10-30 DIAGNOSIS — R69 Illness, unspecified: Secondary | ICD-10-CM | POA: Diagnosis not present

## 2017-10-30 DIAGNOSIS — G894 Chronic pain syndrome: Secondary | ICD-10-CM | POA: Diagnosis not present

## 2017-11-01 ENCOUNTER — Other Ambulatory Visit (HOSPITAL_COMMUNITY): Payer: Self-pay | Admitting: Rehabilitation

## 2017-11-01 DIAGNOSIS — M5136 Other intervertebral disc degeneration, lumbar region: Secondary | ICD-10-CM

## 2017-11-01 DIAGNOSIS — M51369 Other intervertebral disc degeneration, lumbar region without mention of lumbar back pain or lower extremity pain: Secondary | ICD-10-CM

## 2017-11-09 ENCOUNTER — Ambulatory Visit (HOSPITAL_COMMUNITY): Payer: Medicare HMO

## 2017-11-09 ENCOUNTER — Ambulatory Visit (HOSPITAL_COMMUNITY)
Admission: RE | Admit: 2017-11-09 | Discharge: 2017-11-09 | Disposition: A | Discharge status: Home / Self Care | Payer: Medicare HMO | Source: Ambulatory Visit | Attending: Rehabilitation | Admitting: Rehabilitation

## 2017-11-09 DIAGNOSIS — Z981 Arthrodesis status: Secondary | ICD-10-CM | POA: Diagnosis not present

## 2017-11-09 DIAGNOSIS — M48061 Spinal stenosis, lumbar region without neurogenic claudication: Secondary | ICD-10-CM | POA: Diagnosis not present

## 2017-11-09 DIAGNOSIS — M4807 Spinal stenosis, lumbosacral region: Secondary | ICD-10-CM | POA: Insufficient documentation

## 2017-11-09 DIAGNOSIS — M545 Low back pain: Secondary | ICD-10-CM | POA: Diagnosis not present

## 2017-11-09 DIAGNOSIS — M5127 Other intervertebral disc displacement, lumbosacral region: Secondary | ICD-10-CM | POA: Diagnosis not present

## 2017-11-09 DIAGNOSIS — M5136 Other intervertebral disc degeneration, lumbar region: Secondary | ICD-10-CM | POA: Diagnosis present

## 2017-11-14 DIAGNOSIS — M5136 Other intervertebral disc degeneration, lumbar region: Secondary | ICD-10-CM | POA: Diagnosis not present

## 2018-01-09 DIAGNOSIS — M4716 Other spondylosis with myelopathy, lumbar region: Secondary | ICD-10-CM | POA: Diagnosis not present

## 2018-01-09 DIAGNOSIS — M5136 Other intervertebral disc degeneration, lumbar region: Secondary | ICD-10-CM | POA: Diagnosis not present

## 2018-01-25 DIAGNOSIS — N400 Enlarged prostate without lower urinary tract symptoms: Secondary | ICD-10-CM | POA: Diagnosis not present

## 2018-01-25 DIAGNOSIS — M1991 Primary osteoarthritis, unspecified site: Secondary | ICD-10-CM | POA: Diagnosis not present

## 2018-01-25 DIAGNOSIS — J449 Chronic obstructive pulmonary disease, unspecified: Secondary | ICD-10-CM | POA: Diagnosis not present

## 2018-01-25 DIAGNOSIS — E663 Overweight: Secondary | ICD-10-CM | POA: Diagnosis not present

## 2018-01-25 DIAGNOSIS — Z6827 Body mass index (BMI) 27.0-27.9, adult: Secondary | ICD-10-CM | POA: Diagnosis not present

## 2018-01-25 DIAGNOSIS — G894 Chronic pain syndrome: Secondary | ICD-10-CM | POA: Diagnosis not present

## 2018-01-25 DIAGNOSIS — R52 Pain, unspecified: Secondary | ICD-10-CM | POA: Diagnosis not present

## 2018-01-25 DIAGNOSIS — Z1389 Encounter for screening for other disorder: Secondary | ICD-10-CM | POA: Diagnosis not present

## 2018-01-25 DIAGNOSIS — C679 Malignant neoplasm of bladder, unspecified: Secondary | ICD-10-CM | POA: Diagnosis not present

## 2018-01-25 DIAGNOSIS — Z0001 Encounter for general adult medical examination with abnormal findings: Secondary | ICD-10-CM | POA: Diagnosis not present

## 2018-02-25 DIAGNOSIS — M5126 Other intervertebral disc displacement, lumbar region: Secondary | ICD-10-CM | POA: Diagnosis not present

## 2018-02-25 DIAGNOSIS — F411 Generalized anxiety disorder: Secondary | ICD-10-CM | POA: Diagnosis not present

## 2018-02-25 DIAGNOSIS — G894 Chronic pain syndrome: Secondary | ICD-10-CM | POA: Diagnosis not present

## 2018-02-25 DIAGNOSIS — F39 Unspecified mood [affective] disorder: Secondary | ICD-10-CM | POA: Diagnosis not present

## 2018-02-25 DIAGNOSIS — F4542 Pain disorder with related psychological factors: Secondary | ICD-10-CM | POA: Diagnosis not present

## 2018-02-25 DIAGNOSIS — R69 Illness, unspecified: Secondary | ICD-10-CM | POA: Diagnosis not present

## 2018-02-25 DIAGNOSIS — Z72811 Adult antisocial behavior: Secondary | ICD-10-CM | POA: Diagnosis not present

## 2018-02-25 DIAGNOSIS — M48061 Spinal stenosis, lumbar region without neurogenic claudication: Secondary | ICD-10-CM | POA: Diagnosis not present

## 2018-04-07 DIAGNOSIS — R69 Illness, unspecified: Secondary | ICD-10-CM | POA: Diagnosis not present

## 2018-04-22 DIAGNOSIS — G894 Chronic pain syndrome: Secondary | ICD-10-CM | POA: Diagnosis not present

## 2018-05-06 DIAGNOSIS — M4716 Other spondylosis with myelopathy, lumbar region: Secondary | ICD-10-CM | POA: Diagnosis not present

## 2018-05-06 DIAGNOSIS — M1991 Primary osteoarthritis, unspecified site: Secondary | ICD-10-CM | POA: Diagnosis not present

## 2018-05-06 DIAGNOSIS — M545 Low back pain: Secondary | ICD-10-CM | POA: Diagnosis not present

## 2018-05-06 DIAGNOSIS — E663 Overweight: Secondary | ICD-10-CM | POA: Diagnosis not present

## 2018-05-06 DIAGNOSIS — G894 Chronic pain syndrome: Secondary | ICD-10-CM | POA: Diagnosis not present

## 2018-05-06 DIAGNOSIS — J449 Chronic obstructive pulmonary disease, unspecified: Secondary | ICD-10-CM | POA: Diagnosis not present

## 2018-05-06 DIAGNOSIS — M5136 Other intervertebral disc degeneration, lumbar region: Secondary | ICD-10-CM | POA: Diagnosis not present

## 2018-05-06 DIAGNOSIS — Z1389 Encounter for screening for other disorder: Secondary | ICD-10-CM | POA: Diagnosis not present

## 2018-05-06 DIAGNOSIS — R69 Illness, unspecified: Secondary | ICD-10-CM | POA: Diagnosis not present

## 2018-05-06 DIAGNOSIS — Z23 Encounter for immunization: Secondary | ICD-10-CM | POA: Diagnosis not present

## 2018-05-08 ENCOUNTER — Other Ambulatory Visit (HOSPITAL_COMMUNITY): Payer: Self-pay | Admitting: Orthopaedic Surgery

## 2018-05-08 DIAGNOSIS — E663 Overweight: Secondary | ICD-10-CM | POA: Diagnosis not present

## 2018-05-08 DIAGNOSIS — M546 Pain in thoracic spine: Secondary | ICD-10-CM

## 2018-05-08 DIAGNOSIS — Z6825 Body mass index (BMI) 25.0-25.9, adult: Secondary | ICD-10-CM | POA: Diagnosis not present

## 2018-05-08 DIAGNOSIS — G894 Chronic pain syndrome: Secondary | ICD-10-CM | POA: Diagnosis not present

## 2018-05-15 ENCOUNTER — Ambulatory Visit (HOSPITAL_COMMUNITY)
Admission: RE | Admit: 2018-05-15 | Discharge: 2018-05-15 | Disposition: A | Discharge status: Home / Self Care | Payer: Medicare HMO | Source: Ambulatory Visit | Attending: Orthopaedic Surgery | Admitting: Orthopaedic Surgery

## 2018-05-15 DIAGNOSIS — M546 Pain in thoracic spine: Secondary | ICD-10-CM | POA: Diagnosis present

## 2018-05-15 DIAGNOSIS — M47894 Other spondylosis, thoracic region: Secondary | ICD-10-CM | POA: Diagnosis not present

## 2018-05-15 DIAGNOSIS — M5124 Other intervertebral disc displacement, thoracic region: Secondary | ICD-10-CM | POA: Insufficient documentation

## 2018-05-15 DIAGNOSIS — M47814 Spondylosis without myelopathy or radiculopathy, thoracic region: Secondary | ICD-10-CM | POA: Diagnosis not present

## 2018-05-30 DIAGNOSIS — M545 Low back pain: Secondary | ICD-10-CM | POA: Diagnosis not present

## 2018-05-30 DIAGNOSIS — M5136 Other intervertebral disc degeneration, lumbar region: Secondary | ICD-10-CM | POA: Diagnosis not present

## 2018-05-31 DIAGNOSIS — M5126 Other intervertebral disc displacement, lumbar region: Secondary | ICD-10-CM | POA: Diagnosis not present

## 2018-05-31 DIAGNOSIS — M48061 Spinal stenosis, lumbar region without neurogenic claudication: Secondary | ICD-10-CM | POA: Diagnosis not present

## 2018-05-31 DIAGNOSIS — Z01818 Encounter for other preprocedural examination: Secondary | ICD-10-CM | POA: Diagnosis not present

## 2018-05-31 DIAGNOSIS — Z01810 Encounter for preprocedural cardiovascular examination: Secondary | ICD-10-CM | POA: Diagnosis not present

## 2018-06-03 DIAGNOSIS — Z981 Arthrodesis status: Secondary | ICD-10-CM | POA: Diagnosis not present

## 2018-06-03 DIAGNOSIS — M4716 Other spondylosis with myelopathy, lumbar region: Secondary | ICD-10-CM | POA: Diagnosis not present

## 2018-06-03 DIAGNOSIS — J449 Chronic obstructive pulmonary disease, unspecified: Secondary | ICD-10-CM | POA: Diagnosis not present

## 2018-06-03 DIAGNOSIS — R69 Illness, unspecified: Secondary | ICD-10-CM | POA: Diagnosis not present

## 2018-06-03 DIAGNOSIS — Z7409 Other reduced mobility: Secondary | ICD-10-CM | POA: Diagnosis not present

## 2018-06-03 DIAGNOSIS — Z87891 Personal history of nicotine dependence: Secondary | ICD-10-CM | POA: Diagnosis not present

## 2018-06-03 DIAGNOSIS — M545 Low back pain: Secondary | ICD-10-CM | POA: Diagnosis not present

## 2018-06-03 DIAGNOSIS — M961 Postlaminectomy syndrome, not elsewhere classified: Secondary | ICD-10-CM | POA: Diagnosis not present

## 2018-06-03 DIAGNOSIS — Z8673 Personal history of transient ischemic attack (TIA), and cerebral infarction without residual deficits: Secondary | ICD-10-CM | POA: Diagnosis not present

## 2018-06-03 DIAGNOSIS — M5136 Other intervertebral disc degeneration, lumbar region: Secondary | ICD-10-CM | POA: Diagnosis not present

## 2018-06-03 DIAGNOSIS — G894 Chronic pain syndrome: Secondary | ICD-10-CM | POA: Diagnosis not present

## 2018-06-03 HISTORY — PX: BACK SURGERY: SHX140

## 2018-06-05 DIAGNOSIS — Z9889 Other specified postprocedural states: Secondary | ICD-10-CM | POA: Diagnosis not present

## 2018-06-05 DIAGNOSIS — M48061 Spinal stenosis, lumbar region without neurogenic claudication: Secondary | ICD-10-CM | POA: Diagnosis not present

## 2018-06-05 DIAGNOSIS — H9190 Unspecified hearing loss, unspecified ear: Secondary | ICD-10-CM | POA: Diagnosis not present

## 2018-06-05 DIAGNOSIS — G894 Chronic pain syndrome: Secondary | ICD-10-CM | POA: Diagnosis not present

## 2018-06-05 DIAGNOSIS — J449 Chronic obstructive pulmonary disease, unspecified: Secondary | ICD-10-CM | POA: Diagnosis not present

## 2018-06-05 DIAGNOSIS — Z4789 Encounter for other orthopedic aftercare: Secondary | ICD-10-CM | POA: Diagnosis not present

## 2018-06-05 DIAGNOSIS — Z7982 Long term (current) use of aspirin: Secondary | ICD-10-CM | POA: Diagnosis not present

## 2018-06-05 DIAGNOSIS — Z969 Presence of functional implant, unspecified: Secondary | ICD-10-CM | POA: Diagnosis not present

## 2018-06-05 DIAGNOSIS — R69 Illness, unspecified: Secondary | ICD-10-CM | POA: Diagnosis not present

## 2018-06-10 DIAGNOSIS — Z4789 Encounter for other orthopedic aftercare: Secondary | ICD-10-CM | POA: Diagnosis not present

## 2018-06-10 DIAGNOSIS — J449 Chronic obstructive pulmonary disease, unspecified: Secondary | ICD-10-CM | POA: Diagnosis not present

## 2018-06-10 DIAGNOSIS — Z7982 Long term (current) use of aspirin: Secondary | ICD-10-CM | POA: Diagnosis not present

## 2018-06-10 DIAGNOSIS — G894 Chronic pain syndrome: Secondary | ICD-10-CM | POA: Diagnosis not present

## 2018-06-10 DIAGNOSIS — R69 Illness, unspecified: Secondary | ICD-10-CM | POA: Diagnosis not present

## 2018-06-10 DIAGNOSIS — H9190 Unspecified hearing loss, unspecified ear: Secondary | ICD-10-CM | POA: Diagnosis not present

## 2018-06-10 DIAGNOSIS — Z9889 Other specified postprocedural states: Secondary | ICD-10-CM | POA: Diagnosis not present

## 2018-06-10 DIAGNOSIS — M48061 Spinal stenosis, lumbar region without neurogenic claudication: Secondary | ICD-10-CM | POA: Diagnosis not present

## 2018-06-10 DIAGNOSIS — Z969 Presence of functional implant, unspecified: Secondary | ICD-10-CM | POA: Diagnosis not present

## 2018-06-13 DIAGNOSIS — Z7982 Long term (current) use of aspirin: Secondary | ICD-10-CM | POA: Diagnosis not present

## 2018-06-13 DIAGNOSIS — Z4789 Encounter for other orthopedic aftercare: Secondary | ICD-10-CM | POA: Diagnosis not present

## 2018-06-13 DIAGNOSIS — J449 Chronic obstructive pulmonary disease, unspecified: Secondary | ICD-10-CM | POA: Diagnosis not present

## 2018-06-13 DIAGNOSIS — M48061 Spinal stenosis, lumbar region without neurogenic claudication: Secondary | ICD-10-CM | POA: Diagnosis not present

## 2018-06-13 DIAGNOSIS — Z8673 Personal history of transient ischemic attack (TIA), and cerebral infarction without residual deficits: Secondary | ICD-10-CM | POA: Diagnosis not present

## 2018-06-13 DIAGNOSIS — Z969 Presence of functional implant, unspecified: Secondary | ICD-10-CM | POA: Diagnosis not present

## 2018-06-13 DIAGNOSIS — G894 Chronic pain syndrome: Secondary | ICD-10-CM | POA: Diagnosis not present

## 2018-06-13 DIAGNOSIS — H9190 Unspecified hearing loss, unspecified ear: Secondary | ICD-10-CM | POA: Diagnosis not present

## 2018-06-13 DIAGNOSIS — Z8551 Personal history of malignant neoplasm of bladder: Secondary | ICD-10-CM | POA: Diagnosis not present

## 2018-06-13 DIAGNOSIS — Z87891 Personal history of nicotine dependence: Secondary | ICD-10-CM | POA: Diagnosis not present

## 2018-06-13 DIAGNOSIS — Z9889 Other specified postprocedural states: Secondary | ICD-10-CM | POA: Diagnosis not present

## 2018-06-18 DIAGNOSIS — Z8551 Personal history of malignant neoplasm of bladder: Secondary | ICD-10-CM | POA: Diagnosis not present

## 2018-06-18 DIAGNOSIS — Z4789 Encounter for other orthopedic aftercare: Secondary | ICD-10-CM | POA: Diagnosis not present

## 2018-06-18 DIAGNOSIS — Z87891 Personal history of nicotine dependence: Secondary | ICD-10-CM | POA: Diagnosis not present

## 2018-06-18 DIAGNOSIS — Z969 Presence of functional implant, unspecified: Secondary | ICD-10-CM | POA: Diagnosis not present

## 2018-06-18 DIAGNOSIS — Z9889 Other specified postprocedural states: Secondary | ICD-10-CM | POA: Diagnosis not present

## 2018-06-18 DIAGNOSIS — H9190 Unspecified hearing loss, unspecified ear: Secondary | ICD-10-CM | POA: Diagnosis not present

## 2018-06-18 DIAGNOSIS — Z7982 Long term (current) use of aspirin: Secondary | ICD-10-CM | POA: Diagnosis not present

## 2018-06-18 DIAGNOSIS — G894 Chronic pain syndrome: Secondary | ICD-10-CM | POA: Diagnosis not present

## 2018-06-18 DIAGNOSIS — M48061 Spinal stenosis, lumbar region without neurogenic claudication: Secondary | ICD-10-CM | POA: Diagnosis not present

## 2018-06-18 DIAGNOSIS — Z8673 Personal history of transient ischemic attack (TIA), and cerebral infarction without residual deficits: Secondary | ICD-10-CM | POA: Diagnosis not present

## 2018-06-18 DIAGNOSIS — J449 Chronic obstructive pulmonary disease, unspecified: Secondary | ICD-10-CM | POA: Diagnosis not present

## 2018-07-03 DIAGNOSIS — G894 Chronic pain syndrome: Secondary | ICD-10-CM | POA: Diagnosis not present

## 2018-07-03 DIAGNOSIS — J449 Chronic obstructive pulmonary disease, unspecified: Secondary | ICD-10-CM | POA: Diagnosis not present

## 2018-07-03 DIAGNOSIS — Z4789 Encounter for other orthopedic aftercare: Secondary | ICD-10-CM | POA: Diagnosis not present

## 2018-07-08 DIAGNOSIS — G894 Chronic pain syndrome: Secondary | ICD-10-CM | POA: Diagnosis not present

## 2018-07-18 DIAGNOSIS — G9009 Other idiopathic peripheral autonomic neuropathy: Secondary | ICD-10-CM | POA: Diagnosis not present

## 2018-08-08 ENCOUNTER — Other Ambulatory Visit: Payer: Self-pay | Admitting: Internal Medicine

## 2018-08-08 DIAGNOSIS — G894 Chronic pain syndrome: Secondary | ICD-10-CM | POA: Diagnosis not present

## 2018-08-08 DIAGNOSIS — R42 Dizziness and giddiness: Secondary | ICD-10-CM

## 2018-08-08 DIAGNOSIS — R202 Paresthesia of skin: Secondary | ICD-10-CM | POA: Diagnosis not present

## 2018-08-08 DIAGNOSIS — G629 Polyneuropathy, unspecified: Secondary | ICD-10-CM | POA: Diagnosis not present

## 2018-08-08 DIAGNOSIS — Z79891 Long term (current) use of opiate analgesic: Secondary | ICD-10-CM | POA: Diagnosis not present

## 2018-08-08 DIAGNOSIS — H539 Unspecified visual disturbance: Secondary | ICD-10-CM

## 2018-08-08 DIAGNOSIS — R519 Headache, unspecified: Secondary | ICD-10-CM

## 2018-08-08 DIAGNOSIS — R51 Headache: Secondary | ICD-10-CM

## 2018-08-08 DIAGNOSIS — Z6822 Body mass index (BMI) 22.0-22.9, adult: Secondary | ICD-10-CM | POA: Diagnosis not present

## 2018-08-08 DIAGNOSIS — J449 Chronic obstructive pulmonary disease, unspecified: Secondary | ICD-10-CM | POA: Diagnosis not present

## 2018-08-19 ENCOUNTER — Ambulatory Visit (HOSPITAL_COMMUNITY)
Admission: RE | Admit: 2018-08-19 | Discharge: 2018-08-19 | Disposition: A | Discharge status: Home / Self Care | Payer: Medicare Other | Source: Ambulatory Visit | Attending: Internal Medicine | Admitting: Internal Medicine

## 2018-08-19 DIAGNOSIS — R51 Headache: Secondary | ICD-10-CM | POA: Insufficient documentation

## 2018-08-19 DIAGNOSIS — H539 Unspecified visual disturbance: Secondary | ICD-10-CM | POA: Diagnosis not present

## 2018-08-19 DIAGNOSIS — R42 Dizziness and giddiness: Secondary | ICD-10-CM | POA: Insufficient documentation

## 2018-08-19 DIAGNOSIS — R519 Headache, unspecified: Secondary | ICD-10-CM

## 2018-09-05 DIAGNOSIS — M5136 Other intervertebral disc degeneration, lumbar region: Secondary | ICD-10-CM | POA: Diagnosis not present

## 2018-09-05 DIAGNOSIS — M545 Low back pain: Secondary | ICD-10-CM | POA: Diagnosis not present

## 2018-10-04 DIAGNOSIS — M6250 Muscle wasting and atrophy, not elsewhere classified, unspecified site: Secondary | ICD-10-CM | POA: Diagnosis not present

## 2018-10-04 DIAGNOSIS — Z79899 Other long term (current) drug therapy: Secondary | ICD-10-CM | POA: Diagnosis not present

## 2018-10-04 DIAGNOSIS — M5136 Other intervertebral disc degeneration, lumbar region: Secondary | ICD-10-CM | POA: Diagnosis not present

## 2018-10-04 DIAGNOSIS — G894 Chronic pain syndrome: Secondary | ICD-10-CM | POA: Diagnosis not present

## 2018-10-04 DIAGNOSIS — M542 Cervicalgia: Secondary | ICD-10-CM | POA: Diagnosis not present

## 2018-10-04 DIAGNOSIS — Z79891 Long term (current) use of opiate analgesic: Secondary | ICD-10-CM | POA: Diagnosis not present

## 2018-10-08 ENCOUNTER — Ambulatory Visit (INDEPENDENT_AMBULATORY_CARE_PROVIDER_SITE_OTHER): Payer: Medicare Other | Admitting: Urology

## 2018-10-08 DIAGNOSIS — Z8551 Personal history of malignant neoplasm of bladder: Secondary | ICD-10-CM

## 2018-10-08 DIAGNOSIS — N430 Encysted hydrocele: Secondary | ICD-10-CM | POA: Diagnosis not present

## 2018-10-09 DIAGNOSIS — G894 Chronic pain syndrome: Secondary | ICD-10-CM | POA: Diagnosis not present

## 2018-10-09 DIAGNOSIS — Z0001 Encounter for general adult medical examination with abnormal findings: Secondary | ICD-10-CM | POA: Diagnosis not present

## 2018-10-09 DIAGNOSIS — Z1389 Encounter for screening for other disorder: Secondary | ICD-10-CM | POA: Diagnosis not present

## 2018-10-09 DIAGNOSIS — G4700 Insomnia, unspecified: Secondary | ICD-10-CM | POA: Diagnosis not present

## 2018-10-10 DIAGNOSIS — Z79891 Long term (current) use of opiate analgesic: Secondary | ICD-10-CM | POA: Diagnosis not present

## 2018-10-21 ENCOUNTER — Telehealth: Payer: Self-pay

## 2018-10-21 NOTE — Telephone Encounter (Signed)
I attempted to reach out to the pt regarding his new pt appt scheduled for 10/22/18 with Dr Jannifer Franklin.  Pt's mom took the call Joshua [name] POA) She states she did not know Joshua [name] had an appt for tomorrow. I advised we had received a referral from Dr. Gerarda Fraction wanting [name] to be evaluated for dizziness. Pt's mom wanted him to be rescheduled to June and she states she is going to speak with PCP between now and then and confirm if pt needs appt. Appt rescheduled to 12/04/18.

## 2018-10-22 ENCOUNTER — Ambulatory Visit: Payer: Medicare Other | Admitting: Neurology

## 2018-10-24 ENCOUNTER — Other Ambulatory Visit: Payer: Self-pay | Admitting: Urology

## 2018-10-25 ENCOUNTER — Other Ambulatory Visit: Payer: Self-pay | Admitting: Orthopaedic Surgery

## 2018-10-25 DIAGNOSIS — M542 Cervicalgia: Secondary | ICD-10-CM

## 2018-10-31 ENCOUNTER — Telehealth: Payer: Self-pay | Admitting: Nurse Practitioner

## 2018-10-31 NOTE — Telephone Encounter (Signed)
Phone call to patient to verify medication list and allergies for myelogram procedure. Spoke with patients mother. Pt instructed to hold cymbalta (which mother states he is no longer taking) for 48hrs prior to myelogram appointment time. Pt verbalized understanding. Pre and post procedure instructions reviewed with pt.

## 2018-11-19 NOTE — Patient Instructions (Signed)
Joshua [name]  11/19/2018     @PREFPERIOPPHARMACY @   Your procedure is scheduled on  11/26/2018  Report to [facility] at  1100   A.M.  Call this number if you have problems the morning of surgery:  [phone]   Remember:  Do not eat or drink after midnight.                        Take these medicines the morning of surgery with A SIP OF WATER  Zyrtec, cymbalta, M S Contin or oxycodone(if needed).    Do not wear jewelry, make-up or nail polish.  Do not wear lotions, powders, or perfumes, or deodorant.  Do not shave 48 hours prior to surgery.  Men may shave face and neck.  Do not bring valuables to the hospital.  [facility] is not responsible for any belongings or valuables.  Contacts, dentures or bridgework may not be worn into surgery.  Leave your suitcase in the car.  After surgery it may be brought to your room.  For patients admitted to the hospital, discharge time will be determined by your treatment team.  Patients discharged the day of surgery will not be allowed to drive home.   Name and phone number of your driver:   family Special instructions:  None  Please read over the following fact sheets that you were given. Anesthesia Post-op Instructions and Care and Recovery After Surgery       Hydrocelectomy, Adult, Care After This sheet gives you information about how to care for yourself after your procedure. Your health care provider may also give you more specific instructions. If you have problems or questions, contact your health care provider. What can I expect after the procedure? After your procedure, it is common to have mild discomfort, swelling, and bruising in the pouch that holds your testicles (scrotum). Follow these instructions at home: Bathing  Ask your health care provider when you can shower, take baths, or go swimming.  If you were told to wear an athletic support strap, take it off when you shower or take a bath. Incision care    Follow instructions from your health care provider about how to take care of your incision. Make sure you: ? Wash your hands with soap and water before you change your bandage (dressing). If soap and water are not available, use hand sanitizer. ? Change your dressing as told by your health care provider. ? Leave stitches (sutures) in place.  Check your incision and scrotum every day for signs of infection. Check for: ? More redness, swelling, or pain. ? Blood or fluid. ? Warmth. ? Pus or a bad smell. Managing pain, stiffness, and swelling  If directed, apply ice to the injured area: ? Put ice in a plastic bag. ? Place a towel between your skin and the bag. ? Leave the ice on for 20 minutes, 2-3 times per day. Driving  Do not drive for 24 hours if you were given a sedative.  Do not drive or use heavy machinery while taking prescription pain medicine.  Ask your health care provider when it is safe to drive. Activity  Do not do any activities that require great strength and energy (are vigorous) for as long as told by your health care provider.  Return to your normal activities as told by your health care provider. Ask your health care provider what activities are safe for you.  Do not lift anything that is heavier than 10 lb (4.5 kg) until your health care provider says that it is safe. General instructions  Take over-the-counter and prescription medicines only as told by your health care provider.  Keep all follow-up visits as told by your health care provider. This is important.  If you were given an athletic support strap, wear it as told by your health care provider.  If you had a drain put in during the procedure, you will need to return for a follow-up visit to have it removed. Contact a health care provider if:  Your pain is not controlled with medicine.  You have more redness or swelling around your scrotum.  You have blood or fluid coming from your scrotum.  Your  incision feels warm to the touch.  You have pus or a bad smell coming from your scrotum.  You have a fever. This information is not intended to replace advice given to you by your health care provider. Make sure you discuss any questions you have with your health care provider. Document Released: 02/17/2015 Document Revised: 02/26/2016 Document Reviewed: 02/26/2016 Elsevier Interactive Patient Education  2019 [place] Anesthesia, Adult, Care After This sheet gives you information about how to care for yourself after your procedure. Your health care provider may also give you more specific instructions. If you have problems or questions, contact your health care provider. What can I expect after the procedure? After the procedure, the following side effects are common:  Pain or discomfort at the IV site.  Nausea.  Vomiting.  Sore throat.  Trouble concentrating.  Feeling cold or chills.  Weak or tired.  Sleepiness and fatigue.  Soreness and body aches. These side effects can affect parts of the body that were not involved in surgery. Follow these instructions at home:  For at least 24 hours after the procedure:  Have a responsible adult stay with you. It is important to have someone help care for you until you are awake and alert.  Rest as needed.  Do not: ? Participate in activities in which you could fall or become injured. ? Drive. ? Use heavy machinery. ? Drink alcohol. ? Take sleeping pills or medicines that cause drowsiness. ? Make important decisions or sign legal documents. ? Take care of children on your own. Eating and drinking  Follow any instructions from your health care provider about eating or drinking restrictions.  When you feel hungry, start by eating small amounts of foods that are soft and easy to digest (bland), such as toast. Gradually return to your regular diet.  Drink enough fluid to keep your urine pale yellow.  If you vomit,  rehydrate by drinking water, juice, or clear broth. General instructions  If you have sleep apnea, surgery and certain medicines can increase your risk for breathing problems. Follow instructions from your health care provider about wearing your sleep device: ? Anytime you are sleeping, including during daytime naps. ? While taking prescription pain medicines, sleeping medicines, or medicines that make you drowsy.  Return to your normal activities as told by your health care provider. Ask your health care provider what activities are safe for you.  Take over-the-counter and prescription medicines only as told by your health care provider.  If you smoke, do not smoke without supervision.  Keep all follow-up visits as told by your health care provider. This is important. Contact a health care provider if:  You have nausea or vomiting that does not  get better with medicine.  You cannot eat or drink without vomiting.  You have pain that does not get better with medicine.  You are unable to pass urine.  You develop a skin rash.  You have a fever.  You have redness around your IV site that gets worse. Get help right away if:  You have difficulty breathing.  You have chest pain.  You have blood in your urine or stool, or you vomit blood. Summary  After the procedure, it is common to have a sore throat or nausea. It is also common to feel tired.  Have a responsible adult stay with you for the first 24 hours after general anesthesia. It is important to have someone help care for you until you are awake and alert.  When you feel hungry, start by eating small amounts of foods that are soft and easy to digest (bland), such as toast. Gradually return to your regular diet.  Drink enough fluid to keep your urine pale yellow.  Return to your normal activities as told by your health care provider. Ask your health care provider what activities are safe for you. This information is not  intended to replace advice given to you by your health care provider. Make sure you discuss any questions you have with your health care provider. Document Released: 09/04/2000 Document Revised: 01/12/2017 Document Reviewed: 01/12/2017 Elsevier Interactive Patient Education  2019 [employer].

## 2018-11-21 ENCOUNTER — Other Ambulatory Visit (HOSPITAL_COMMUNITY): Payer: Medicare Other

## 2018-11-22 ENCOUNTER — Encounter (HOSPITAL_COMMUNITY)
Admission: RE | Admit: 2018-11-22 | Discharge: 2018-11-22 | Disposition: A | Discharge status: Home / Self Care | Payer: Medicare Other | Source: Ambulatory Visit | Attending: Urology | Admitting: Urology

## 2018-11-22 ENCOUNTER — Other Ambulatory Visit: Payer: Self-pay

## 2018-11-22 ENCOUNTER — Other Ambulatory Visit (HOSPITAL_COMMUNITY)
Admission: RE | Admit: 2018-11-22 | Discharge: 2018-11-22 | Disposition: A | Discharge status: Home / Self Care | Payer: Medicare Other | Source: Ambulatory Visit | Attending: Urology | Admitting: Urology

## 2018-11-22 ENCOUNTER — Encounter (HOSPITAL_COMMUNITY): Payer: Self-pay

## 2018-11-22 DIAGNOSIS — Z1159 Encounter for screening for other viral diseases: Secondary | ICD-10-CM | POA: Insufficient documentation

## 2018-11-22 DIAGNOSIS — Z01812 Encounter for preprocedural laboratory examination: Secondary | ICD-10-CM | POA: Insufficient documentation

## 2018-11-22 HISTORY — DX: Other specified postprocedural states: R11.2

## 2018-11-22 HISTORY — DX: Other specified postprocedural states: Z98.890

## 2018-11-22 HISTORY — DX: Nausea with vomiting, unspecified: R11.2

## 2018-11-22 LAB — BASIC METABOLIC PANEL
Anion gap: 9 (ref 5–15)
BUN: 17 mg/dL (ref 8–23)
CO2: 24 mmol/L (ref 22–32)
Calcium: 9.1 mg/dL (ref 8.9–10.3)
Chloride: 104 mmol/L (ref 98–111)
Creatinine, Ser: 0.98 mg/dL (ref 0.61–1.24)
GFR calc Af Amer: >60 mL/min (ref >60)
GFR calc non Af Amer: >60 mL/min (ref >60)
Glucose, Bld: 105 mg/dL — ABNORMAL HIGH (ref 70–99)
Potassium: 4.1 mmol/L (ref 3.5–5.1)
Sodium: 137 mmol/L (ref 135–145)

## 2018-11-22 LAB — CBC WITH DIFFERENTIAL/PLATELET
Abs Immature Granulocytes: 0.02 10*3/uL (ref 0.00–0.07)
Basophils Absolute: 0.1 10*3/uL (ref 0.0–0.1)
Basophils Relative: 1 %
Eosinophils Absolute: 0.2 10*3/uL (ref 0.0–0.5)
Eosinophils Relative: 3 %
HCT: 37.3 % — ABNORMAL LOW (ref 39.0–52.0)
Hemoglobin: 11.8 g/dL — ABNORMAL LOW (ref 13.0–17.0)
Immature Granulocytes: 0 %
Lymphocytes Relative: 28 %
Lymphs Abs: 1.8 10*3/uL (ref 0.7–4.0)
MCH: 31.1 pg (ref 26.0–34.0)
MCHC: 31.6 g/dL (ref 30.0–36.0)
MCV: 98.2 fL (ref 80.0–100.0)
Monocytes Absolute: 0.8 10*3/uL (ref 0.1–1.0)
Monocytes Relative: 13 %
Neutro Abs: 3.5 10*3/uL (ref 1.7–7.7)
Neutrophils Relative %: 55 %
Platelets: 220 10*3/uL (ref 150–400)
RBC: 3.8 MIL/uL — ABNORMAL LOW (ref 4.22–5.81)
RDW: 14.2 % (ref 11.5–15.5)
WBC: 6.3 10*3/uL (ref 4.0–10.5)
nRBC: 0 % (ref 0.0–0.2)

## 2018-11-23 LAB — NOVEL CORONAVIRUS, NAA (HOSP ORDER, SEND-OUT TO REF LAB; TAT 18-24 HRS): SARS-CoV-2, NAA: NOT DETECTED

## 2018-11-26 ENCOUNTER — Ambulatory Visit (HOSPITAL_COMMUNITY): Payer: Medicare Other | Admitting: Anesthesiology

## 2018-11-26 ENCOUNTER — Encounter (HOSPITAL_COMMUNITY): Payer: Self-pay | Admitting: *Deleted

## 2018-11-26 ENCOUNTER — Ambulatory Visit (HOSPITAL_COMMUNITY)
Admission: RE | Admit: 2018-11-26 | Discharge: 2018-11-26 | Disposition: A | Discharge status: Home / Self Care | Payer: Medicare Other | Attending: Urology | Admitting: Urology

## 2018-11-26 ENCOUNTER — Other Ambulatory Visit: Payer: Self-pay

## 2018-11-26 ENCOUNTER — Encounter (HOSPITAL_COMMUNITY)
Admission: RE | Disposition: A | Discharge status: Home / Self Care | Payer: Self-pay | Source: Home / Self Care | Attending: Urology

## 2018-11-26 DIAGNOSIS — F17210 Nicotine dependence, cigarettes, uncomplicated: Secondary | ICD-10-CM | POA: Diagnosis not present

## 2018-11-26 DIAGNOSIS — Z8551 Personal history of malignant neoplasm of bladder: Secondary | ICD-10-CM | POA: Diagnosis not present

## 2018-11-26 DIAGNOSIS — Z8673 Personal history of transient ischemic attack (TIA), and cerebral infarction without residual deficits: Secondary | ICD-10-CM | POA: Insufficient documentation

## 2018-11-26 DIAGNOSIS — J449 Chronic obstructive pulmonary disease, unspecified: Secondary | ICD-10-CM | POA: Insufficient documentation

## 2018-11-26 DIAGNOSIS — N430 Encysted hydrocele: Secondary | ICD-10-CM

## 2018-11-26 DIAGNOSIS — N433 Hydrocele, unspecified: Secondary | ICD-10-CM | POA: Insufficient documentation

## 2018-11-26 DIAGNOSIS — M1990 Unspecified osteoarthritis, unspecified site: Secondary | ICD-10-CM | POA: Diagnosis not present

## 2018-11-26 HISTORY — PX: HYDROCELE EXCISION: SHX482

## 2018-11-26 SURGERY — HYDROCELECTOMY
Anesthesia: General | Site: Scrotum | Laterality: Right

## 2018-11-26 MED ORDER — TRAMADOL HCL 50 MG PO TABS: 50.0000 mg | ORAL_TABLET | Freq: Four times a day (QID) | ORAL | 0 refills | Status: AC | PRN

## 2018-11-26 MED ORDER — SUCCINYLCHOLINE CHLORIDE 200 MG/10ML IV SOSY
PREFILLED_SYRINGE | INTRAVENOUS | Status: AC
  Filled 2018-11-26: qty 10

## 2018-11-26 MED ORDER — LACTATED RINGERS IV SOLN
INTRAVENOUS | Status: DC
  Administered 2018-11-26: 11:00:00 via INTRAVENOUS

## 2018-11-26 MED ORDER — PROPOFOL 10 MG/ML IV BOLUS
INTRAVENOUS | Status: DC | PRN
  Administered 2018-11-26: 150 mg via INTRAVENOUS

## 2018-11-26 MED ORDER — DEXAMETHASONE SODIUM PHOSPHATE 4 MG/ML IJ SOLN
INTRAMUSCULAR | Status: DC | PRN
  Administered 2018-11-26: 8 mg via INTRAVENOUS

## 2018-11-26 MED ORDER — GLYCOPYRROLATE PF 0.2 MG/ML IJ SOSY
PREFILLED_SYRINGE | INTRAMUSCULAR | Status: AC
  Filled 2018-11-26: qty 1

## 2018-11-26 MED ORDER — 0.9 % SODIUM CHLORIDE (POUR BTL) OPTIME
TOPICAL | Status: DC | PRN
  Administered 2018-11-26: 1000 mL

## 2018-11-26 MED ORDER — SEVOFLURANE IN SOLN
RESPIRATORY_TRACT | Status: AC
  Filled 2018-11-26: qty 250

## 2018-11-26 MED ORDER — BUPIVACAINE HCL (PF) 0.25 % IJ SOLN
INTRAMUSCULAR | Status: DC | PRN
  Administered 2018-11-26: 10 mL

## 2018-11-26 MED ORDER — CEPHALEXIN 500 MG PO CAPS: 500.0000 mg | ORAL_CAPSULE | Freq: Two times a day (BID) | ORAL | 0 refills | Status: DC

## 2018-11-26 MED ORDER — MIDAZOLAM HCL 2 MG/2ML IJ SOLN: 0.5000 mg | Freq: Once | INTRAMUSCULAR | Status: DC | PRN

## 2018-11-26 MED ORDER — GLYCOPYRROLATE PF 0.2 MG/ML IJ SOSY
PREFILLED_SYRINGE | INTRAMUSCULAR | Status: DC | PRN
  Administered 2018-11-26: .2 mg via INTRAVENOUS

## 2018-11-26 MED ORDER — BUPIVACAINE HCL (PF) 0.25 % IJ SOLN
INTRAMUSCULAR | Status: AC
  Filled 2018-11-26: qty 30

## 2018-11-26 MED ORDER — HYDROCODONE-ACETAMINOPHEN 7.5-325 MG PO TABS
1.0000 | ORAL_TABLET | Freq: Once | ORAL | Status: AC | PRN
  Administered 2018-11-26: 1 via ORAL
  Filled 2018-11-26: qty 1

## 2018-11-26 MED ORDER — LIDOCAINE 2% (20 MG/ML) 5 ML SYRINGE
INTRAMUSCULAR | Status: AC
  Filled 2018-11-26: qty 5

## 2018-11-26 MED ORDER — PROMETHAZINE HCL 25 MG/ML IJ SOLN: 6.2500 mg | INTRAMUSCULAR | Status: DC | PRN

## 2018-11-26 MED ORDER — PROPOFOL 10 MG/ML IV BOLUS
INTRAVENOUS | Status: AC
  Filled 2018-11-26: qty 40

## 2018-11-26 MED ORDER — FENTANYL CITRATE (PF) 250 MCG/5ML IJ SOLN
INTRAMUSCULAR | Status: AC
  Filled 2018-11-26: qty 5

## 2018-11-26 MED ORDER — CEFAZOLIN SODIUM-DEXTROSE 2-4 GM/100ML-% IV SOLN
2.0000 g | INTRAVENOUS | Status: AC
  Administered 2018-11-26: 2 g via INTRAVENOUS

## 2018-11-26 MED ORDER — HYDROMORPHONE HCL 1 MG/ML IJ SOLN
0.2500 mg | INTRAMUSCULAR | Status: DC | PRN
  Administered 2018-11-26: 0.5 mg via INTRAVENOUS
  Filled 2018-11-26: qty 0.5

## 2018-11-26 MED ORDER — CEFAZOLIN (ANCEF) 1 G IV SOLR: 2.0000 g | INTRAVENOUS | Status: DC

## 2018-11-26 MED ORDER — FENTANYL CITRATE (PF) 100 MCG/2ML IJ SOLN
INTRAMUSCULAR | Status: DC | PRN
  Administered 2018-11-26: 50 ug via INTRAVENOUS
  Administered 2018-11-26: 25 ug via INTRAVENOUS

## 2018-11-26 MED ORDER — ONDANSETRON HCL 4 MG/2ML IJ SOLN
INTRAMUSCULAR | Status: DC | PRN
  Administered 2018-11-26: 4 mg via INTRAVENOUS

## 2018-11-26 MED ORDER — LIDOCAINE 2% (20 MG/ML) 5 ML SYRINGE
INTRAMUSCULAR | Status: DC | PRN
  Administered 2018-11-26: 40 mg via INTRAVENOUS

## 2018-11-26 SURGICAL SUPPLY — 30 items
BNDG GAUZE ELAST 4 BULKY (GAUZE/BANDAGES/DRESSINGS) ×2 IMPLANT
COVER LIGHT HANDLE STERIS (MISCELLANEOUS) ×4 IMPLANT
COVER WAND RF STERILE (DRAPES) ×1 IMPLANT
DECANTER SPIKE VIAL GLASS SM (MISCELLANEOUS) ×2 IMPLANT
DERMABOND ADVANCED (GAUZE/BANDAGES/DRESSINGS) ×1
DERMABOND ADVANCED .7 DNX12 (GAUZE/BANDAGES/DRESSINGS) IMPLANT
DRAIN PENROSE 18X1/2 LTX STRL (DRAIN) ×1 IMPLANT
ELECT NDL TIP 2.8 STRL (NEEDLE) ×1 IMPLANT
ELECT NEEDLE TIP 2.8 STRL (NEEDLE) ×2 IMPLANT
ELECT REM PT RETURN 9FT ADLT (ELECTROSURGICAL) ×2
ELECTRODE REM PT RTRN 9FT ADLT (ELECTROSURGICAL) ×1 IMPLANT
GAUZE SPONGE 4X4 12PLY STRL (GAUZE/BANDAGES/DRESSINGS) ×2 IMPLANT
GLOVE BIO SURGEON STRL SZ7 (GLOVE) ×1 IMPLANT
GLOVE BIOGEL M 8.0 STRL (GLOVE) ×2 IMPLANT
GOWN STRL REUS W/ TWL LRG LVL3 (GOWN DISPOSABLE) ×1 IMPLANT
GOWN STRL REUS W/TWL LRG LVL3 (GOWN DISPOSABLE) ×2
GOWN STRL REUS W/TWL XL LVL3 (GOWN DISPOSABLE) ×2 IMPLANT
INST SET MINOR GENERAL (KITS) ×2 IMPLANT
KIT TURNOVER KIT A (KITS) ×2 IMPLANT
NEEDLE HYPO 22GX1.5 SAFETY (NEEDLE) ×2 IMPLANT
NS IRRIG 1000ML POUR BTL (IV SOLUTION) ×2 IMPLANT
PACK MINOR (CUSTOM PROCEDURE TRAY) ×2 IMPLANT
PAD ARMBOARD 7.5X6 YLW CONV (MISCELLANEOUS) ×2 IMPLANT
SET BASIN LINEN APH (SET/KITS/TRAYS/PACK) ×3 IMPLANT
SUPPORT SCROTAL MED ADLT STRP (MISCELLANEOUS) ×2 IMPLANT
SUT CHROMIC 2 0 SH (SUTURE) ×2 IMPLANT
SUT CHROMIC 3 0 SH 27 (SUTURE) ×2 IMPLANT
SUT MNCRL AB 4-0 PS2 18 (SUTURE) ×2 IMPLANT
SYR BULB IRRIGATION 50ML (SYRINGE) ×2 IMPLANT
SYR CONTROL 10ML LL (SYRINGE) ×2 IMPLANT

## 2018-11-26 NOTE — Anesthesia Postprocedure Evaluation (Signed)
Anesthesia Post Note  Patient: Joshua [name]  Procedure(s) Performed: RIGHT HYDROCELECTOMY (Right Scrotum)  Patient location during evaluation: PACU Anesthesia Type: General Level of consciousness: awake and alert and oriented Pain management: pain level controlled Vital Signs Assessment: post-procedure vital signs reviewed and stable Respiratory status: spontaneous breathing Cardiovascular status: stable Postop Assessment: no apparent nausea or vomiting Anesthetic complications: no     Last Vitals:  Vitals:   11/26/18 1124 11/26/18 1330  BP: 113/74 123/84  Pulse: 70 67  Resp: 11 (!) 9  Temp: 36.8 C 36.4 C  SpO2: 100% 96%    Last Pain:  Vitals:   11/26/18 1330  TempSrc:   PainSc: 0-No pain                 ADAMS, AMY A

## 2018-11-26 NOTE — Interval H&P Note (Signed)
History and Physical Interval Note:  11/26/2018 12:24 PM  Joshua [name]  has presented today for surgery, with the diagnosis of RIGHT HYDROCELECTOMY.  The various methods of treatment have been discussed with the patient and family. After consideration of risks, benefits and other options for treatment, the patient has consented to  Procedure(s) with comments: HYDROCELECTOMY ADULT (Right) - 45 MINS as a surgical intervention.  The patient's history has been reviewed, patient examined, no change in status, stable for surgery.  I have reviewed the patient's chart and labs.  Questions were answered to the patient's satisfaction.     Lillette Boxer Jamani Bearce

## 2018-11-26 NOTE — Anesthesia Preprocedure Evaluation (Signed)
Anesthesia Evaluation  Patient identified by MRN, date of birth, ID band Patient awake    Reviewed: Allergy & Precautions, NPO status , Patient's Chart, lab work & pertinent test results  History of Anesthesia Complications (+) PONV  Airway Mallampati: II  TM Distance: >3 FB Neck ROM: Full    Dental no notable dental hx. (+) Edentulous Upper, Edentulous Lower   Pulmonary COPD,  COPD inhaler, Current Smoker,    Pulmonary exam normal breath sounds clear to auscultation       Cardiovascular Exercise Tolerance: Good negative cardio ROS Normal cardiovascular examI Rhythm:Regular Rate:Normal  Limited ET  Denies CP or cardiac issues    Neuro/Psych States had two small CVA vs TIA prior to 2000 Walks without assist  Neuromuscular disease CVA, No Residual Symptoms negative psych ROS   GI/Hepatic negative GI ROS, Neg liver ROS,   Endo/Other  negative endocrine ROS  Renal/GU negative Renal ROS  negative genitourinary   Musculoskeletal  (+) Arthritis , Osteoarthritis,    Abdominal   Peds negative pediatric ROS (+)  Hematology negative hematology ROS (+)   Anesthesia Other Findings Here for R hydroceolectomy  Reproductive/Obstetrics negative OB ROS                             Anesthesia Physical Anesthesia Plan  ASA: III  Anesthesia Plan: General   Post-op Pain Management:    Induction:   PONV Risk Score and Plan: 2 and Ondansetron and Treatment may vary due to age or medical condition  Airway Management Planned: LMA and Oral ETT  Additional Equipment:   Intra-op Plan:   Post-operative Plan:   Informed Consent:   Plan Discussed with:   Anesthesia Plan Comments: (Plan Full PPE use  Plan GA (LMA) vs GETA as needed  Pt very hard of hearing )        Anesthesia Quick Evaluation

## 2018-11-26 NOTE — Anesthesia Procedure Notes (Signed)
Procedure Name: LMA Insertion Date/Time: 11/26/2018 12:39 PM Performed by: Jonna Munro, CRNA Pre-anesthesia Checklist: Patient identified, Emergency Drugs available, Suction available, Patient being monitored and Timeout performed Patient Re-evaluated:Patient Re-evaluated prior to induction Oxygen Delivery Method: Circle system utilized Preoxygenation: Pre-oxygenation with 100% oxygen Induction Type: IV induction Ventilation: Mask ventilation without difficulty LMA: LMA inserted LMA Size: 4.0 Number of attempts: 1 Placement Confirmation: positive ETCO2 and breath sounds checked- equal and bilateral Tube secured with: Tape Dental Injury: Teeth and Oropharynx as per pre-operative assessment

## 2018-11-26 NOTE — Op Note (Signed)
Preoperative diagnosis: Right hydrocele   Postoperative diagnosis: Same   Procedure:Right hydrocele repair   Surgeon: Lillette Boxer. Kemari Narez, M.D.   Anesthesia: Gen.   Drain: 1/4" Penrose  Indications: Patient presented with scrotal swelling. A hydrocele was confirmed with scrotal ultrasonography. The patient was symptomatic from his hydrocele and requested surgical intervention. He appeared to understand the risks benefits potential complications of this procedure.   Procedure: The patient was properly identified and marked in the holding room. He received preoperative IV antibiotics. He was then taken to the operating room where general anesthetic was administered using the LMA. The scrotum was then prepped and draped in the usual manner. Appropriate surgical timeout was performed. An incision was made in the median raphae of the scrotum. The hydrocele was encountered which was freed from the scrotal wall and then the testis and hydrocele sac were delivered from the incision. The hydrocele sac was then incised anteriorly and a large amount of fluid was obtained. The testis was carefully inspected and no other pathology was appreciated. The hydrocele sac was moderate in thickness.  The sac was then plicated posteriorly with a running 3-0 chromic suture. The spermatic cord block was then performed with quarter percent Marcaine. The testis was returned to the hemiscrotum taking great care to make sure that there was no twisting of the spermatic cord. Inspection of the inside of the scrotum revealed no significant bleeding. A quarter-inch Penrose was brought through the lower aspect of the scrotum into the affected hemiscrotum and sutured to the skin. The scrotal incision was then closed anatomically, first with a running 3-0 chromic reapproximating the dartos fascia, and then a 4-0 Monocryl used to close the skin in a simple running fashion. A fluff dressing and jockstrap was then placed Sponge and needle  counts were correct. No obvious complications occurred and the patient was brought to recovery room in stable condition.

## 2018-11-26 NOTE — Transfer of Care (Signed)
Immediate Anesthesia Transfer of Care Note  Patient: Joshua [name]  Procedure(s) Performed: RIGHT HYDROCELECTOMY (Right Scrotum)  Patient Location: PACU  Anesthesia Type:General  Level of Consciousness: awake, alert , oriented and patient cooperative  Airway & Oxygen Therapy: Patient Spontanous Breathing  Post-op Assessment: Report given to RN and Post -op Vital signs reviewed and stable  Post vital signs: Reviewed and stable  Last Vitals:  Vitals Value Taken Time  BP 123/84 11/26/18 1330  Temp    Pulse 62 11/26/18 1331  Resp 9 11/26/18 1331  SpO2 100 % 11/26/18 1331  Vitals shown include unvalidated device data.  Last Pain:  Vitals:   11/26/18 1124  TempSrc: Oral  PainSc: 0-No pain      Patients Stated Pain Goal: 7 (33/38/32 9191)  Complications: No apparent anesthesia complications

## 2018-11-26 NOTE — Discharge Instructions (Signed)
Scrotal surgery postoperative instructions  Wound:  In most cases your incision will have absorbable sutures that will dissolve within the first 2-3 weeks. Some will fall out even earlier. Expect some redness as the sutures dissolve but this should occur only around the sutures. If there is generalized redness, especially with increasing pain or swelling, let us know. The scrotum will very likely get "black and blue" as the blood in the tissues spread. Sometimes the whole scrotum will turn colors. The black and blue is followed by a yellow and brown color. In time, all the discoloration will go away. In some cases some firm swelling in the area of the testicle may persist for up to 4-6 weeks after the surgery and is considered normal in most cases.  Drain:  If the surgeon placed a drain through the bottom part of your scrotum, it is held in with a small stitch. In 4 days, cut the small stitch and slide to drain out. Once the drain has been removed, a small hole made drain out for another day or 2. If so, keep a clean washcloth underneath your supportive undergarment, or sterile gauze. Until the hole seals up, all bathing should be in the shower, and not in the bathtub.  Diet:  You may return to your normal diet within 24 hours following your surgery. You may note some mild nausea and possibly vomiting the first 6-8 hours following surgery. This is usually due to the side effects of anesthesia, and will disappear quite soon. I would suggest clear liquids and a very light meal the first evening following your surgery.  Activity:  Your physical activity should be restricted the first 48 hours. During that time you should remain relatively inactive, moving about only when necessary. During the first 7-10 days following surgery you should avoid lifting any heavy objects (anything greater than 15 pounds), and avoid strenuous exercise. If you work, ask [place] specifically about your restrictions, both for work  and home. We will write a note to your employer if needed.  You should plan to wear a tight pair of jockey shorts or an athletic supporter for the first 4-5 days, even to sleep. This will keep the scrotum immobilized to some degree and keep the swelling down. You may find it more comfortable to wear a support longer.  Ice packs should be placed on and off over the scrotum for the first 48 hours. Frozen peas or corn in a ZipLock bag can be frozen, used and re-frozen. Fifteen minutes on and 15 minutes off is a reasonable schedule. The ice is a good pain reliever and keeps the swelling down.  Hygiene:  You may shower 48 hours after your surgery. Tub bathing should be restricted until the seventh day.          Medication:  You will be sent home with some type of pain medication. In many cases you will be sent home with a narcotic pain pill (Vicodin or Tylox). If the pain is not too bad, you may take either Tylenol (acetaminophen) or Advil (ibuprofen) which contain no narcotic agents, and might be tolerated a little better, with fewer side effects. If the pain medication you are sent home with does not control the pain, you will have to let us know. Some narcotic pain medications cannot be given or refilled by a phone call to a pharmacy.  Problems you should report to [place]:   Fever of 101.0 degrees Fahrenheit or greater.  Moderate or severe  swelling under the skin incision or involving the scrotum.  Drug reaction such as hives, a rash, nausea or vomiting.

## 2018-11-26 NOTE — H&P (Signed)
H&P  Chief Complaint: Right scrotal swelling History of Present Illness: 68 year old male presents for right hydrocelectomy. He has an enlarging, bothersome right hydrocele.  Past Medical History:  Diagnosis Date  . Back pain   . 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 Chronic pain   . COPD (chronic obstructive pulmonary disease) ([place])   . Meniere's disease   . PONV (postoperative nausea and vomiting)   . Stroke [facility])    30 years ago    Past Surgical History:  Procedure Laterality Date  . BACK SURGERY    . BLADDER SURGERY    . hard of hearing    . NASAL SINUS SURGERY      Home Medications:  Allergies as of 11/26/2018   No Known Allergies     Medication List    Notice   Cannot display discharge medications because the patient has not yet been admitted.     Allergies: No Known Allergies  No family history on file.  Social History:  reports that he has been smoking cigarettes. He has been smoking about 1.00 pack per day. He uses smokeless tobacco. He reports that he does not drink alcohol or use drugs.  ROS: A complete review of systems was performed.  All systems are negative except for pertinent findings as noted.  Physical Exam:  Vital signs in last 24 hours:   Constitutional:  Alert and oriented, No acute distress Cardiovascular: Regular rate  Respiratory: Normal respiratory effort GI: Abdomen is soft, nontender, nondistended, no abdominal masses. No CVAT.  Genitourinary: Normal male phallus, testes are descended bilaterally and non-tender and without masses, scrotum is normal in appearance without lesions or masses, perineum is normal on inspection. Lymphatic: No lymphadenopathy Neurologic: Grossly intact, no focal deficits Psychiatric: Normal mood and affect  Laboratory Data:  No results for input(s): WBC, HGB, HCT, PLT in the last 72 hours.  No results for input(s): NA, K, CL, GLUCOSE, BUN, CALCIUM, CREATININE in the last 72 hours.  Invalid input(s):  CO3   No results found for this or any previous visit (from the past 24 hour(s)). Recent Results (from the past 240 hour(s))  Novel Coronavirus, NAA (hospital order; send-out to ref lab)     Status: None   Collection Time: 11/22/18  7:22 AM   Specimen: Nasopharyngeal Swab; Respiratory  Result Value Ref Range Status   SARS-CoV-2, NAA NOT DETECTED NOT DETECTED Final    Comment: (NOTE) This test was developed and its performance characteristics determined by [employer]. This test has not been FDA cleared or approved. This test has been authorized by FDA under an Emergency Use Authorization (EUA). This test is only authorized for the duration of time the declaration that circumstances exist justifying the authorization of the emergency use of in vitro diagnostic tests for detection of SARS-CoV-2 virus and/or diagnosis of COVID-19 infection under section 564(b)(1) of the Act, 21 U.S.C. 974BUL-8(G)(5), unless the authorization is terminated or revoked sooner. When diagnostic testing is negative, the possibility of a false negative result should be considered in the context of a patient's recent exposures and the presence of clinical signs and symptoms consistent with COVID-19. An individual without symptoms of COVID-19 and who is not shedding SARS-CoV-2 virus would expect to have a negative (not detected) result in this assay. Performed  At: [facility] [address] [place], [place] [id number] Rush Farmer MD YY:[id number]    [place]  Final    Comment: Performed  at [facility], [address] [place], [place] [address]    Renal Function: Recent Labs    11/22/18 1313  CREATININE 0.98   Estimated Creatinine Clearance: 76.1 mL/min (by C-G formula based on SCr of 0.98 mg/dL).  Radiologic Imaging: No results found.  Impression/Assessment:  Right hydrocele  Plan:  Right hydrocelectomy

## 2018-11-27 ENCOUNTER — Encounter (HOSPITAL_COMMUNITY): Payer: Self-pay | Admitting: Urology

## 2018-12-02 ENCOUNTER — Telehealth: Payer: Self-pay | Admitting: Neurology

## 2018-12-02 NOTE — Telephone Encounter (Signed)
Due to current COVID 19 pandemic, our office is severely reducing in office visits until further notice, in order to minimize the risk to our patients and healthcare providers.   I called patient to discuss a virtual visit/in office visit for 6/24 appt. Requested pt call us back. Office contact info provided.

## 2018-12-04 ENCOUNTER — Ambulatory Visit: Payer: Self-pay | Admitting: Neurology

## 2018-12-04 ENCOUNTER — Telehealth: Payer: Self-pay | Admitting: Neurology

## 2018-12-04 NOTE — Telephone Encounter (Signed)
This patient did not show for a new patient appointment today. 

## 2018-12-06 ENCOUNTER — Ambulatory Visit
Admission: RE | Admit: 2018-12-06 | Discharge: 2018-12-06 | Disposition: A | Discharge status: Home / Self Care | Payer: Medicare Other | Source: Ambulatory Visit | Attending: Orthopaedic Surgery | Admitting: Orthopaedic Surgery

## 2018-12-06 DIAGNOSIS — M542 Cervicalgia: Secondary | ICD-10-CM

## 2018-12-06 MED ORDER — IOPAMIDOL (ISOVUE-M 300) INJECTION 61%: 10.0000 mL | Freq: Once | INTRAMUSCULAR | Status: DC | PRN

## 2018-12-06 MED ORDER — DIAZEPAM 5 MG PO TABS
5.0000 mg | ORAL_TABLET | Freq: Once | ORAL | Status: AC
  Administered 2018-12-06: 5 mg via ORAL

## 2018-12-06 NOTE — Progress Notes (Signed)
Patient (and his sister) state he has been off Cymbalta for at least the past two days.

## 2018-12-06 NOTE — Discharge Instructions (Signed)

## 2018-12-23 DIAGNOSIS — Z79899 Other long term (current) drug therapy: Secondary | ICD-10-CM | POA: Diagnosis not present

## 2018-12-23 DIAGNOSIS — T85192A Other mechanical complication of implanted electronic neurostimulator (electrode) of spinal cord, initial encounter: Secondary | ICD-10-CM | POA: Diagnosis not present

## 2018-12-24 DIAGNOSIS — M546 Pain in thoracic spine: Secondary | ICD-10-CM | POA: Diagnosis not present

## 2018-12-24 DIAGNOSIS — G8929 Other chronic pain: Secondary | ICD-10-CM | POA: Diagnosis not present

## 2018-12-24 DIAGNOSIS — M545 Low back pain: Secondary | ICD-10-CM | POA: Diagnosis not present

## 2019-01-07 ENCOUNTER — Ambulatory Visit (INDEPENDENT_AMBULATORY_CARE_PROVIDER_SITE_OTHER): Payer: Medicare Other | Admitting: Urology

## 2019-01-07 DIAGNOSIS — N430 Encysted hydrocele: Secondary | ICD-10-CM

## 2019-02-04 DIAGNOSIS — M546 Pain in thoracic spine: Secondary | ICD-10-CM | POA: Diagnosis not present

## 2019-02-04 DIAGNOSIS — Z79899 Other long term (current) drug therapy: Secondary | ICD-10-CM | POA: Diagnosis not present

## 2019-02-04 DIAGNOSIS — M961 Postlaminectomy syndrome, not elsewhere classified: Secondary | ICD-10-CM | POA: Diagnosis not present

## 2019-02-04 DIAGNOSIS — M545 Low back pain: Secondary | ICD-10-CM | POA: Diagnosis not present

## 2019-02-04 DIAGNOSIS — G8929 Other chronic pain: Secondary | ICD-10-CM | POA: Diagnosis not present

## 2019-02-18 ENCOUNTER — Ambulatory Visit (INDEPENDENT_AMBULATORY_CARE_PROVIDER_SITE_OTHER): Payer: Medicare Other | Admitting: Urology

## 2019-02-18 DIAGNOSIS — N430 Encysted hydrocele: Secondary | ICD-10-CM

## 2019-03-05 DIAGNOSIS — G8929 Other chronic pain: Secondary | ICD-10-CM | POA: Diagnosis not present

## 2019-03-05 DIAGNOSIS — M961 Postlaminectomy syndrome, not elsewhere classified: Secondary | ICD-10-CM | POA: Diagnosis not present

## 2019-03-05 DIAGNOSIS — Z79899 Other long term (current) drug therapy: Secondary | ICD-10-CM | POA: Diagnosis not present

## 2019-03-05 DIAGNOSIS — M546 Pain in thoracic spine: Secondary | ICD-10-CM | POA: Diagnosis not present

## 2019-03-24 DIAGNOSIS — M1991 Primary osteoarthritis, unspecified site: Secondary | ICD-10-CM | POA: Diagnosis not present

## 2019-03-24 DIAGNOSIS — J449 Chronic obstructive pulmonary disease, unspecified: Secondary | ICD-10-CM | POA: Diagnosis not present

## 2019-03-24 DIAGNOSIS — G4700 Insomnia, unspecified: Secondary | ICD-10-CM | POA: Diagnosis not present

## 2019-03-24 DIAGNOSIS — Z6822 Body mass index (BMI) 22.0-22.9, adult: Secondary | ICD-10-CM | POA: Diagnosis not present

## 2019-03-24 DIAGNOSIS — G894 Chronic pain syndrome: Secondary | ICD-10-CM | POA: Diagnosis not present

## 2019-04-03 DIAGNOSIS — Z79899 Other long term (current) drug therapy: Secondary | ICD-10-CM | POA: Diagnosis not present

## 2019-04-03 DIAGNOSIS — G8929 Other chronic pain: Secondary | ICD-10-CM | POA: Diagnosis not present

## 2019-04-03 DIAGNOSIS — M546 Pain in thoracic spine: Secondary | ICD-10-CM | POA: Diagnosis not present

## 2019-04-03 DIAGNOSIS — M961 Postlaminectomy syndrome, not elsewhere classified: Secondary | ICD-10-CM | POA: Diagnosis not present

## 2019-04-08 ENCOUNTER — Ambulatory Visit (INDEPENDENT_AMBULATORY_CARE_PROVIDER_SITE_OTHER): Payer: Medicare Other | Admitting: Urology

## 2019-04-08 DIAGNOSIS — N430 Encysted hydrocele: Secondary | ICD-10-CM | POA: Diagnosis not present

## 2019-04-09 ENCOUNTER — Other Ambulatory Visit: Payer: Self-pay | Admitting: *Deleted

## 2019-04-09 DIAGNOSIS — Z20822 Contact with and (suspected) exposure to covid-19: Secondary | ICD-10-CM

## 2019-04-11 ENCOUNTER — Telehealth: Payer: Self-pay | Admitting: General Practice

## 2019-04-11 LAB — NOVEL CORONAVIRUS, NAA: SARS-CoV-2, NAA: NOT DETECTED

## 2019-04-11 NOTE — Telephone Encounter (Signed)
Negative COVID results given. Patient results "NOT Detected." Caller expressed understanding. ° °

## 2019-04-25 DIAGNOSIS — Z23 Encounter for immunization: Secondary | ICD-10-CM | POA: Diagnosis not present

## 2019-05-01 DIAGNOSIS — M546 Pain in thoracic spine: Secondary | ICD-10-CM | POA: Diagnosis not present

## 2019-05-01 DIAGNOSIS — Z79899 Other long term (current) drug therapy: Secondary | ICD-10-CM | POA: Diagnosis not present

## 2019-05-01 DIAGNOSIS — G8929 Other chronic pain: Secondary | ICD-10-CM | POA: Diagnosis not present

## 2019-05-01 DIAGNOSIS — M961 Postlaminectomy syndrome, not elsewhere classified: Secondary | ICD-10-CM | POA: Diagnosis not present

## 2019-05-20 IMAGING — MR MR THORACIC SPINE W/O CM
4 of 7 series · 10 of 48 positions shown · non-contrast
Comparison: CT chest August 09, 2016

CLINICAL DATA: Chronic back pain, leg numbness. History of bladder
cancer.

EXAM:
MRI THORACIC SPINE WITHOUT CONTRAST
TECHNIQUE: Multiplanar, multisequence MR imaging of the thoracic spine was
performed. No intravenous contrast was administered.

[Series 5: T1 · sagittal · 4.0mm · 0.52mm/px · 3 of 13 slices shown (1 of 2)]
[im 1/13]
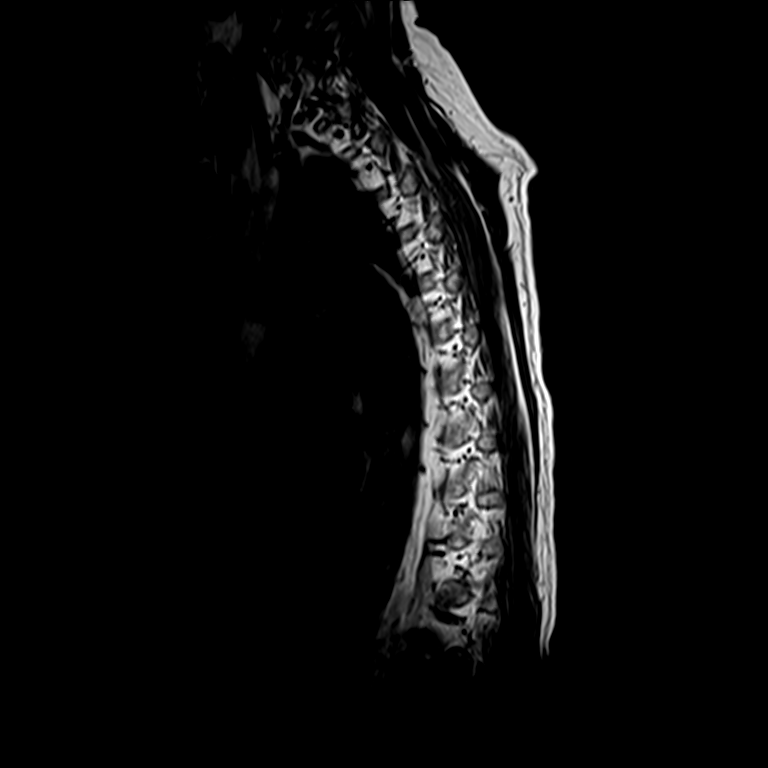
[im 7/13]
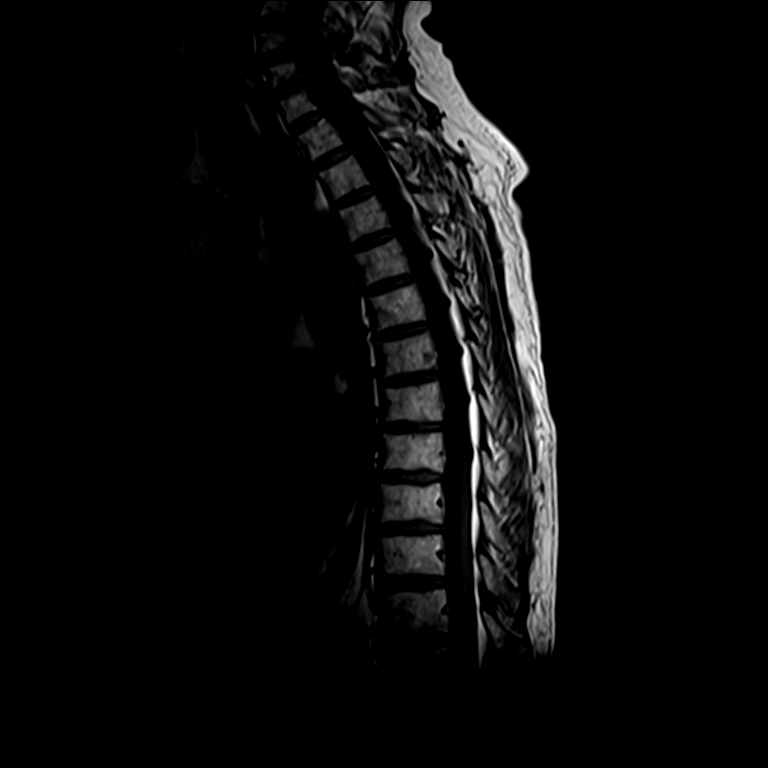
[im 13/13]
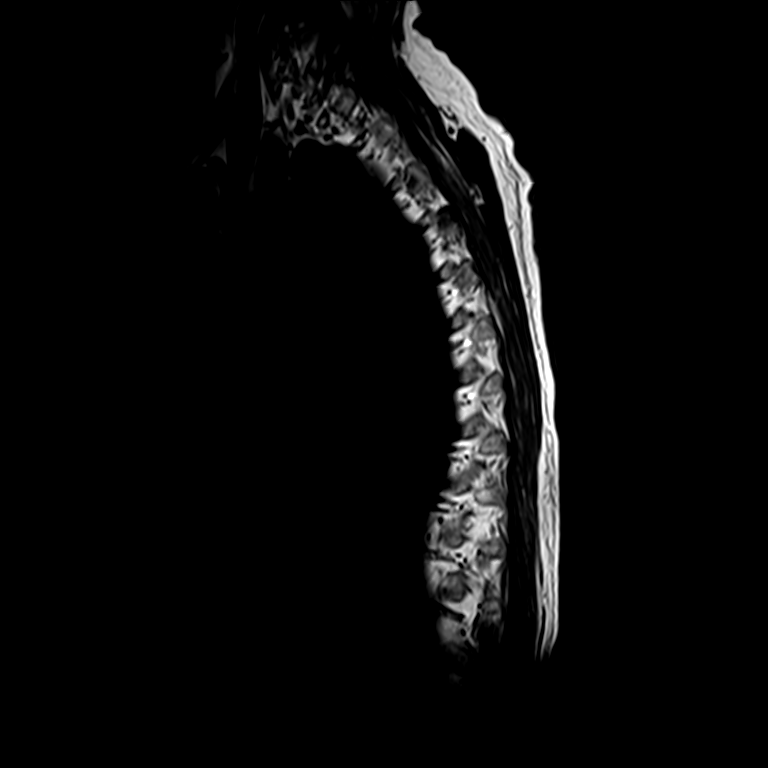

[Series 6: T2 · sagittal · 4.0mm · 0.46mm/px · 3 of 13 slices shown (1 of 2)]
[im 1/13]
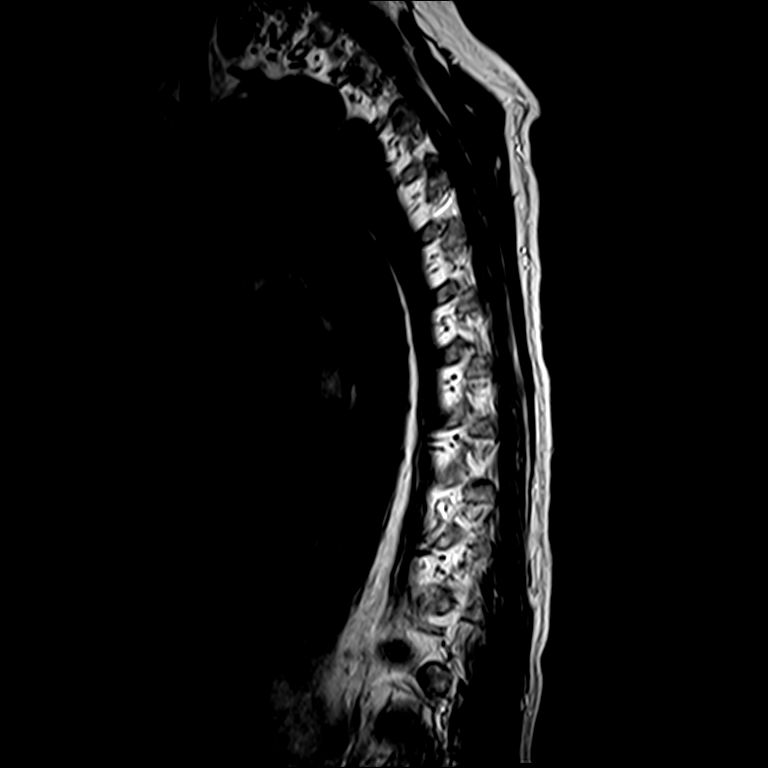
[im 9/13]
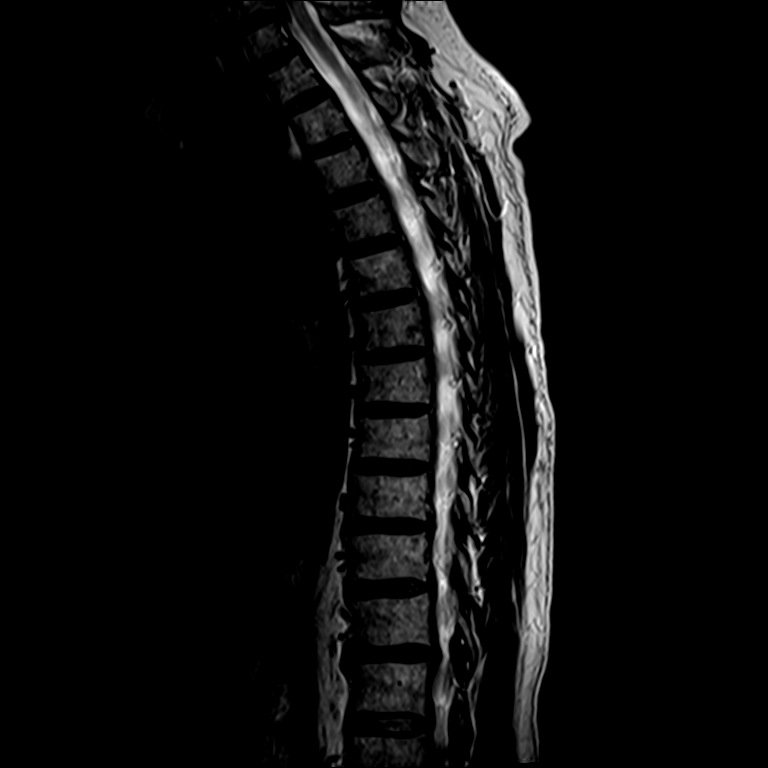
[im 13/13]
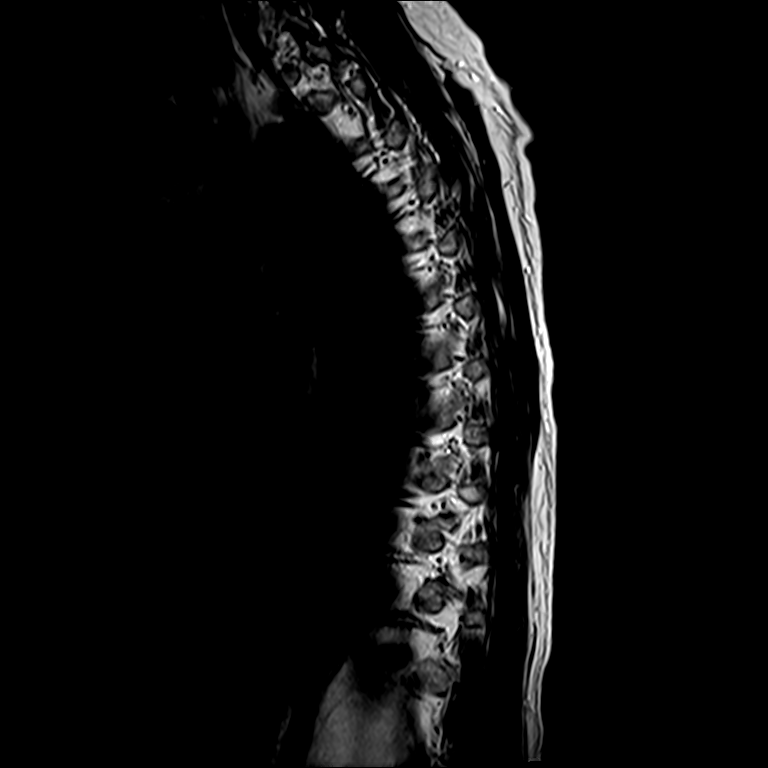

[Series 7: T1 · sagittal · 4.0mm · 0.46mm/px · 1 of 13 slices shown (2 of 2)]
[im 1/13]
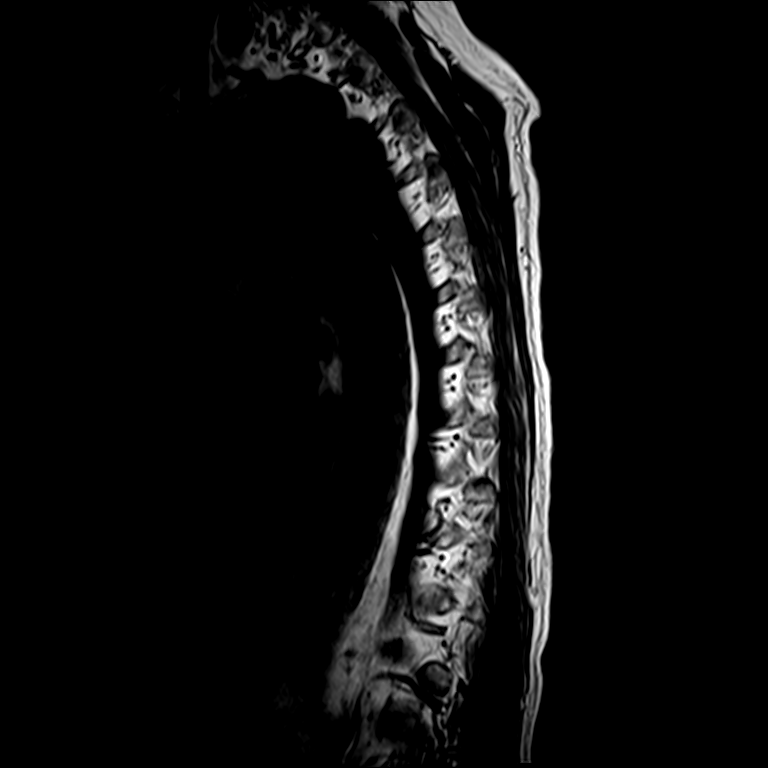

[Series 11: T2 · axial · 3.0mm · 0.30mm/px · z∈[-318,-127]mm · 3 of 37 slices shown (2 of 2)]
[im 7/37]
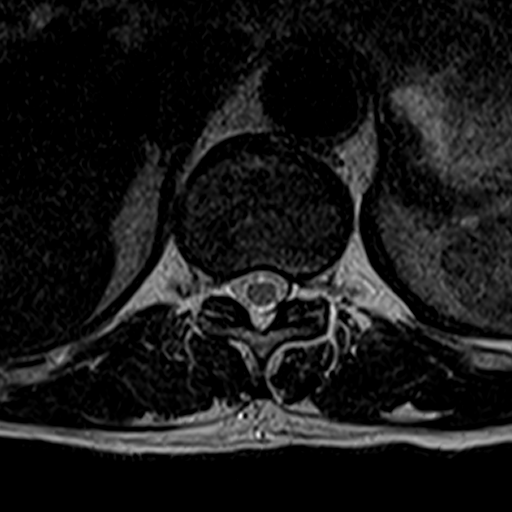
[im 19/37]
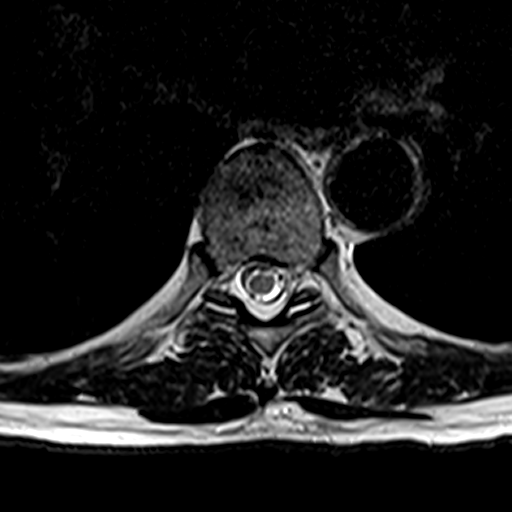
[im 31/37]
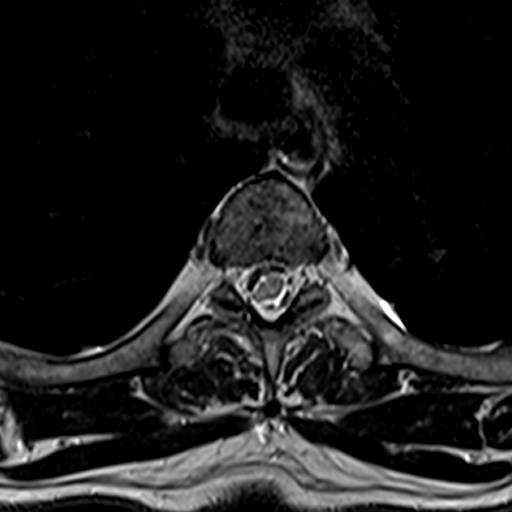

[10 of 48 positions shown; findings below may reference images not displayed]

FINDINGS: ALIGNMENT: Maintenance of the thoracic kyphosis. No malalignment.

VERTEBRAE/DISCS: Vertebral bodies are intact. Multilevel mild disc
height loss and mild chronic discogenic endplate changes with
diffuse mild disc desiccation. Mild acute discogenic endplate
changes T11-12. No suspicious bone marrow signal.

CORD: Thoracic spinal cord is normal morphology and signal
characteristics to the level of the conus medullaris which
terminates at T12-L1.

PREVERTEBRAL AND PARASPINAL SOFT TISSUES:  Normal.

DISC LEVELS:

Tiny RIGHT central T6-7, small LEFT central T7-8 disc protrusions.
Moderate LEFT central T8-9 disc protrusion may affect the traversing
LEFT T9 nerve. No canal stenosis at any level. Mild LEFT T10-11
neural foraminal narrowing.
IMPRESSION: 1. No fracture or malalignment.
2. Degenerative change of the thoracic spine without canal stenosis.
3. Moderate T8-9 disc protrusion may affect the traversing LEFT T9
nerve. Mild LEFT T10-11 neural foraminal narrowing.

## 2019-05-30 DIAGNOSIS — Z79899 Other long term (current) drug therapy: Secondary | ICD-10-CM | POA: Diagnosis not present

## 2019-05-30 DIAGNOSIS — M546 Pain in thoracic spine: Secondary | ICD-10-CM | POA: Diagnosis not present

## 2019-05-30 DIAGNOSIS — G8929 Other chronic pain: Secondary | ICD-10-CM | POA: Diagnosis not present

## 2019-05-30 DIAGNOSIS — M961 Postlaminectomy syndrome, not elsewhere classified: Secondary | ICD-10-CM | POA: Diagnosis not present

## 2019-06-12 DIAGNOSIS — J449 Chronic obstructive pulmonary disease, unspecified: Secondary | ICD-10-CM | POA: Diagnosis not present

## 2019-06-12 DIAGNOSIS — G894 Chronic pain syndrome: Secondary | ICD-10-CM | POA: Diagnosis not present

## 2019-06-12 DIAGNOSIS — Z720 Tobacco use: Secondary | ICD-10-CM | POA: Diagnosis not present

## 2019-06-27 DIAGNOSIS — M546 Pain in thoracic spine: Secondary | ICD-10-CM | POA: Diagnosis not present

## 2019-06-27 DIAGNOSIS — M961 Postlaminectomy syndrome, not elsewhere classified: Secondary | ICD-10-CM | POA: Diagnosis not present

## 2019-06-27 DIAGNOSIS — Z79899 Other long term (current) drug therapy: Secondary | ICD-10-CM | POA: Diagnosis not present

## 2019-06-27 DIAGNOSIS — G8929 Other chronic pain: Secondary | ICD-10-CM | POA: Diagnosis not present

## 2019-06-27 DIAGNOSIS — F17210 Nicotine dependence, cigarettes, uncomplicated: Secondary | ICD-10-CM | POA: Diagnosis not present

## 2019-06-27 DIAGNOSIS — M545 Low back pain: Secondary | ICD-10-CM | POA: Diagnosis not present

## 2019-07-28 DIAGNOSIS — M546 Pain in thoracic spine: Secondary | ICD-10-CM | POA: Diagnosis not present

## 2019-07-28 DIAGNOSIS — M961 Postlaminectomy syndrome, not elsewhere classified: Secondary | ICD-10-CM | POA: Diagnosis not present

## 2019-07-28 DIAGNOSIS — G8929 Other chronic pain: Secondary | ICD-10-CM | POA: Diagnosis not present

## 2019-07-28 DIAGNOSIS — Z79899 Other long term (current) drug therapy: Secondary | ICD-10-CM | POA: Diagnosis not present

## 2019-07-28 DIAGNOSIS — M545 Low back pain: Secondary | ICD-10-CM | POA: Diagnosis not present

## 2019-08-20 DIAGNOSIS — Z79899 Other long term (current) drug therapy: Secondary | ICD-10-CM | POA: Diagnosis not present

## 2019-08-20 DIAGNOSIS — M545 Low back pain: Secondary | ICD-10-CM | POA: Diagnosis not present

## 2019-08-20 DIAGNOSIS — M5416 Radiculopathy, lumbar region: Secondary | ICD-10-CM | POA: Diagnosis not present

## 2019-08-20 DIAGNOSIS — G894 Chronic pain syndrome: Secondary | ICD-10-CM | POA: Diagnosis not present

## 2019-08-20 DIAGNOSIS — Z1389 Encounter for screening for other disorder: Secondary | ICD-10-CM | POA: Diagnosis not present

## 2019-08-25 DIAGNOSIS — Z79899 Other long term (current) drug therapy: Secondary | ICD-10-CM | POA: Diagnosis not present

## 2019-09-10 DIAGNOSIS — Z720 Tobacco use: Secondary | ICD-10-CM | POA: Diagnosis not present

## 2019-09-10 DIAGNOSIS — G894 Chronic pain syndrome: Secondary | ICD-10-CM | POA: Diagnosis not present

## 2019-09-10 DIAGNOSIS — J449 Chronic obstructive pulmonary disease, unspecified: Secondary | ICD-10-CM | POA: Diagnosis not present

## 2019-10-02 DIAGNOSIS — G894 Chronic pain syndrome: Secondary | ICD-10-CM | POA: Diagnosis not present

## 2019-10-02 DIAGNOSIS — E559 Vitamin D deficiency, unspecified: Secondary | ICD-10-CM | POA: Diagnosis not present

## 2019-10-02 DIAGNOSIS — E039 Hypothyroidism, unspecified: Secondary | ICD-10-CM | POA: Diagnosis not present

## 2019-10-02 DIAGNOSIS — E782 Mixed hyperlipidemia: Secondary | ICD-10-CM | POA: Diagnosis not present

## 2019-10-02 DIAGNOSIS — Z6824 Body mass index (BMI) 24.0-24.9, adult: Secondary | ICD-10-CM | POA: Diagnosis not present

## 2019-10-02 DIAGNOSIS — Z0001 Encounter for general adult medical examination with abnormal findings: Secondary | ICD-10-CM | POA: Diagnosis not present

## 2019-10-02 DIAGNOSIS — E538 Deficiency of other specified B group vitamins: Secondary | ICD-10-CM | POA: Diagnosis not present

## 2019-10-02 DIAGNOSIS — Z1389 Encounter for screening for other disorder: Secondary | ICD-10-CM | POA: Diagnosis not present

## 2019-12-02 DIAGNOSIS — M461 Sacroiliitis, not elsewhere classified: Secondary | ICD-10-CM | POA: Diagnosis not present

## 2019-12-02 DIAGNOSIS — Z79899 Other long term (current) drug therapy: Secondary | ICD-10-CM | POA: Diagnosis not present

## 2019-12-02 DIAGNOSIS — G894 Chronic pain syndrome: Secondary | ICD-10-CM | POA: Diagnosis not present

## 2019-12-02 DIAGNOSIS — M5416 Radiculopathy, lumbar region: Secondary | ICD-10-CM | POA: Diagnosis not present

## 2019-12-05 DIAGNOSIS — Z79899 Other long term (current) drug therapy: Secondary | ICD-10-CM | POA: Diagnosis not present

## 2020-02-02 DIAGNOSIS — M545 Low back pain: Secondary | ICD-10-CM | POA: Diagnosis not present

## 2020-02-02 DIAGNOSIS — Z79899 Other long term (current) drug therapy: Secondary | ICD-10-CM | POA: Diagnosis not present

## 2020-02-02 DIAGNOSIS — G8929 Other chronic pain: Secondary | ICD-10-CM | POA: Diagnosis not present

## 2020-03-01 DIAGNOSIS — Z79899 Other long term (current) drug therapy: Secondary | ICD-10-CM | POA: Diagnosis not present

## 2020-03-01 DIAGNOSIS — F17200 Nicotine dependence, unspecified, uncomplicated: Secondary | ICD-10-CM | POA: Diagnosis not present

## 2020-03-01 DIAGNOSIS — G8929 Other chronic pain: Secondary | ICD-10-CM | POA: Diagnosis not present

## 2020-03-01 DIAGNOSIS — M545 Low back pain: Secondary | ICD-10-CM | POA: Diagnosis not present

## 2020-03-11 DIAGNOSIS — G894 Chronic pain syndrome: Secondary | ICD-10-CM | POA: Diagnosis not present

## 2020-03-11 DIAGNOSIS — J449 Chronic obstructive pulmonary disease, unspecified: Secondary | ICD-10-CM | POA: Diagnosis not present

## 2020-03-11 DIAGNOSIS — Z720 Tobacco use: Secondary | ICD-10-CM | POA: Diagnosis not present

## 2020-03-24 DIAGNOSIS — C678 Malignant neoplasm of overlapping sites of bladder: Secondary | ICD-10-CM | POA: Diagnosis not present

## 2020-03-24 DIAGNOSIS — Z23 Encounter for immunization: Secondary | ICD-10-CM | POA: Diagnosis not present

## 2020-03-24 DIAGNOSIS — M1991 Primary osteoarthritis, unspecified site: Secondary | ICD-10-CM | POA: Diagnosis not present

## 2020-03-24 DIAGNOSIS — Z6822 Body mass index (BMI) 22.0-22.9, adult: Secondary | ICD-10-CM | POA: Diagnosis not present

## 2020-04-05 DIAGNOSIS — F17200 Nicotine dependence, unspecified, uncomplicated: Secondary | ICD-10-CM | POA: Diagnosis not present

## 2020-04-05 DIAGNOSIS — Z79899 Other long term (current) drug therapy: Secondary | ICD-10-CM | POA: Diagnosis not present

## 2020-04-05 DIAGNOSIS — M545 Low back pain, unspecified: Secondary | ICD-10-CM | POA: Diagnosis not present

## 2020-04-05 DIAGNOSIS — G8929 Other chronic pain: Secondary | ICD-10-CM | POA: Diagnosis not present

## 2020-04-10 DIAGNOSIS — Z720 Tobacco use: Secondary | ICD-10-CM | POA: Diagnosis not present

## 2020-04-10 DIAGNOSIS — J449 Chronic obstructive pulmonary disease, unspecified: Secondary | ICD-10-CM | POA: Diagnosis not present

## 2020-04-10 DIAGNOSIS — G894 Chronic pain syndrome: Secondary | ICD-10-CM | POA: Diagnosis not present

## 2020-05-03 DIAGNOSIS — G8929 Other chronic pain: Secondary | ICD-10-CM | POA: Diagnosis not present

## 2020-05-03 DIAGNOSIS — M545 Low back pain, unspecified: Secondary | ICD-10-CM | POA: Diagnosis not present

## 2020-05-03 DIAGNOSIS — F17200 Nicotine dependence, unspecified, uncomplicated: Secondary | ICD-10-CM | POA: Diagnosis not present

## 2020-05-03 DIAGNOSIS — Z79899 Other long term (current) drug therapy: Secondary | ICD-10-CM | POA: Diagnosis not present

## 2020-06-01 DIAGNOSIS — G8929 Other chronic pain: Secondary | ICD-10-CM | POA: Diagnosis not present

## 2020-06-01 DIAGNOSIS — F17200 Nicotine dependence, unspecified, uncomplicated: Secondary | ICD-10-CM | POA: Diagnosis not present

## 2020-06-01 DIAGNOSIS — M545 Low back pain, unspecified: Secondary | ICD-10-CM | POA: Diagnosis not present

## 2020-06-01 DIAGNOSIS — Z79899 Other long term (current) drug therapy: Secondary | ICD-10-CM | POA: Diagnosis not present

## 2020-06-29 DIAGNOSIS — F17200 Nicotine dependence, unspecified, uncomplicated: Secondary | ICD-10-CM | POA: Diagnosis not present

## 2020-06-29 DIAGNOSIS — Z79899 Other long term (current) drug therapy: Secondary | ICD-10-CM | POA: Diagnosis not present

## 2020-06-29 DIAGNOSIS — M545 Low back pain, unspecified: Secondary | ICD-10-CM | POA: Diagnosis not present

## 2020-07-10 DIAGNOSIS — J449 Chronic obstructive pulmonary disease, unspecified: Secondary | ICD-10-CM | POA: Diagnosis not present

## 2020-07-10 DIAGNOSIS — Z720 Tobacco use: Secondary | ICD-10-CM | POA: Diagnosis not present

## 2020-07-10 DIAGNOSIS — G894 Chronic pain syndrome: Secondary | ICD-10-CM | POA: Diagnosis not present

## 2020-07-27 DIAGNOSIS — F17200 Nicotine dependence, unspecified, uncomplicated: Secondary | ICD-10-CM | POA: Diagnosis not present

## 2020-07-27 DIAGNOSIS — N390 Urinary tract infection, site not specified: Secondary | ICD-10-CM | POA: Diagnosis not present

## 2020-07-27 DIAGNOSIS — R300 Dysuria: Secondary | ICD-10-CM | POA: Diagnosis not present

## 2020-07-27 DIAGNOSIS — Z79899 Other long term (current) drug therapy: Secondary | ICD-10-CM | POA: Diagnosis not present

## 2020-07-27 DIAGNOSIS — M545 Low back pain, unspecified: Secondary | ICD-10-CM | POA: Diagnosis not present

## 2020-07-27 DIAGNOSIS — R82998 Other abnormal findings in urine: Secondary | ICD-10-CM | POA: Diagnosis not present

## 2020-08-16 DIAGNOSIS — J069 Acute upper respiratory infection, unspecified: Secondary | ICD-10-CM | POA: Diagnosis not present

## 2020-08-16 DIAGNOSIS — J441 Chronic obstructive pulmonary disease with (acute) exacerbation: Secondary | ICD-10-CM | POA: Diagnosis not present

## 2020-08-19 DIAGNOSIS — C679 Malignant neoplasm of bladder, unspecified: Secondary | ICD-10-CM | POA: Diagnosis not present

## 2020-08-23 DIAGNOSIS — L729 Follicular cyst of the skin and subcutaneous tissue, unspecified: Secondary | ICD-10-CM | POA: Diagnosis not present

## 2020-08-24 DIAGNOSIS — M545 Low back pain, unspecified: Secondary | ICD-10-CM | POA: Diagnosis not present

## 2020-08-24 DIAGNOSIS — Z79899 Other long term (current) drug therapy: Secondary | ICD-10-CM | POA: Diagnosis not present

## 2020-08-24 DIAGNOSIS — F17200 Nicotine dependence, unspecified, uncomplicated: Secondary | ICD-10-CM | POA: Diagnosis not present

## 2020-09-08 DIAGNOSIS — J449 Chronic obstructive pulmonary disease, unspecified: Secondary | ICD-10-CM | POA: Diagnosis not present

## 2020-09-08 DIAGNOSIS — Z720 Tobacco use: Secondary | ICD-10-CM | POA: Diagnosis not present

## 2020-09-08 DIAGNOSIS — G894 Chronic pain syndrome: Secondary | ICD-10-CM | POA: Diagnosis not present

## 2020-09-21 DIAGNOSIS — Z79899 Other long term (current) drug therapy: Secondary | ICD-10-CM | POA: Diagnosis not present

## 2020-09-21 DIAGNOSIS — M545 Low back pain, unspecified: Secondary | ICD-10-CM | POA: Diagnosis not present

## 2020-09-21 DIAGNOSIS — F17200 Nicotine dependence, unspecified, uncomplicated: Secondary | ICD-10-CM | POA: Diagnosis not present

## 2020-09-22 DIAGNOSIS — Z6823 Body mass index (BMI) 23.0-23.9, adult: Secondary | ICD-10-CM | POA: Diagnosis not present

## 2020-09-22 DIAGNOSIS — F5109 Other insomnia not due to a substance or known physiological condition: Secondary | ICD-10-CM | POA: Diagnosis not present

## 2020-10-19 DIAGNOSIS — M545 Low back pain, unspecified: Secondary | ICD-10-CM | POA: Diagnosis not present

## 2020-10-19 DIAGNOSIS — F17200 Nicotine dependence, unspecified, uncomplicated: Secondary | ICD-10-CM | POA: Diagnosis not present

## 2020-10-19 DIAGNOSIS — Z79899 Other long term (current) drug therapy: Secondary | ICD-10-CM | POA: Diagnosis not present

## 2020-11-16 DIAGNOSIS — M545 Low back pain, unspecified: Secondary | ICD-10-CM | POA: Diagnosis not present

## 2020-11-16 DIAGNOSIS — Z79899 Other long term (current) drug therapy: Secondary | ICD-10-CM | POA: Diagnosis not present

## 2020-11-16 DIAGNOSIS — F17200 Nicotine dependence, unspecified, uncomplicated: Secondary | ICD-10-CM | POA: Diagnosis not present

## 2020-12-09 DIAGNOSIS — G894 Chronic pain syndrome: Secondary | ICD-10-CM | POA: Diagnosis not present

## 2020-12-09 DIAGNOSIS — J449 Chronic obstructive pulmonary disease, unspecified: Secondary | ICD-10-CM | POA: Diagnosis not present

## 2020-12-14 DIAGNOSIS — E7800 Pure hypercholesterolemia, unspecified: Secondary | ICD-10-CM | POA: Diagnosis not present

## 2020-12-14 DIAGNOSIS — Z0000 Encounter for general adult medical examination without abnormal findings: Secondary | ICD-10-CM | POA: Diagnosis not present

## 2020-12-14 DIAGNOSIS — M545 Low back pain, unspecified: Secondary | ICD-10-CM | POA: Diagnosis not present

## 2020-12-14 DIAGNOSIS — Z1159 Encounter for screening for other viral diseases: Secondary | ICD-10-CM | POA: Diagnosis not present

## 2020-12-14 DIAGNOSIS — M79672 Pain in left foot: Secondary | ICD-10-CM | POA: Diagnosis not present

## 2020-12-14 DIAGNOSIS — Z79899 Other long term (current) drug therapy: Secondary | ICD-10-CM | POA: Diagnosis not present

## 2020-12-14 DIAGNOSIS — R5383 Other fatigue: Secondary | ICD-10-CM | POA: Diagnosis not present

## 2020-12-14 DIAGNOSIS — E559 Vitamin D deficiency, unspecified: Secondary | ICD-10-CM | POA: Diagnosis not present

## 2020-12-14 DIAGNOSIS — F17200 Nicotine dependence, unspecified, uncomplicated: Secondary | ICD-10-CM | POA: Diagnosis not present

## 2020-12-23 DIAGNOSIS — S99912A Unspecified injury of left ankle, initial encounter: Secondary | ICD-10-CM | POA: Diagnosis not present

## 2020-12-23 DIAGNOSIS — S99822A Other specified injuries of left foot, initial encounter: Secondary | ICD-10-CM | POA: Diagnosis not present

## 2020-12-23 DIAGNOSIS — M79673 Pain in unspecified foot: Secondary | ICD-10-CM | POA: Diagnosis not present

## 2020-12-23 DIAGNOSIS — M25572 Pain in left ankle and joints of left foot: Secondary | ICD-10-CM | POA: Diagnosis not present

## 2020-12-23 DIAGNOSIS — M79672 Pain in left foot: Secondary | ICD-10-CM | POA: Diagnosis not present

## 2020-12-29 DIAGNOSIS — J449 Chronic obstructive pulmonary disease, unspecified: Secondary | ICD-10-CM | POA: Diagnosis not present

## 2020-12-29 DIAGNOSIS — Z6823 Body mass index (BMI) 23.0-23.9, adult: Secondary | ICD-10-CM | POA: Diagnosis not present

## 2020-12-29 DIAGNOSIS — S9032XA Contusion of left foot, initial encounter: Secondary | ICD-10-CM | POA: Diagnosis not present

## 2021-01-06 DIAGNOSIS — M79672 Pain in left foot: Secondary | ICD-10-CM | POA: Diagnosis not present

## 2021-01-11 DIAGNOSIS — F17200 Nicotine dependence, unspecified, uncomplicated: Secondary | ICD-10-CM | POA: Diagnosis not present

## 2021-01-11 DIAGNOSIS — Z6822 Body mass index (BMI) 22.0-22.9, adult: Secondary | ICD-10-CM | POA: Diagnosis not present

## 2021-01-11 DIAGNOSIS — M545 Low back pain, unspecified: Secondary | ICD-10-CM | POA: Diagnosis not present

## 2021-01-11 DIAGNOSIS — M961 Postlaminectomy syndrome, not elsewhere classified: Secondary | ICD-10-CM | POA: Diagnosis not present

## 2021-01-11 DIAGNOSIS — Z79899 Other long term (current) drug therapy: Secondary | ICD-10-CM | POA: Diagnosis not present

## 2021-02-08 DIAGNOSIS — Z79899 Other long term (current) drug therapy: Secondary | ICD-10-CM | POA: Diagnosis not present

## 2021-02-08 DIAGNOSIS — M545 Low back pain, unspecified: Secondary | ICD-10-CM | POA: Diagnosis not present

## 2021-02-08 DIAGNOSIS — F17210 Nicotine dependence, cigarettes, uncomplicated: Secondary | ICD-10-CM | POA: Diagnosis not present

## 2021-02-08 DIAGNOSIS — Z6822 Body mass index (BMI) 22.0-22.9, adult: Secondary | ICD-10-CM | POA: Diagnosis not present

## 2021-03-07 DIAGNOSIS — M545 Low back pain, unspecified: Secondary | ICD-10-CM | POA: Diagnosis not present

## 2021-03-07 DIAGNOSIS — Z79899 Other long term (current) drug therapy: Secondary | ICD-10-CM | POA: Diagnosis not present

## 2021-03-07 DIAGNOSIS — F17210 Nicotine dependence, cigarettes, uncomplicated: Secondary | ICD-10-CM | POA: Diagnosis not present

## 2021-03-07 DIAGNOSIS — Z6822 Body mass index (BMI) 22.0-22.9, adult: Secondary | ICD-10-CM | POA: Diagnosis not present

## 2021-03-11 DIAGNOSIS — J449 Chronic obstructive pulmonary disease, unspecified: Secondary | ICD-10-CM | POA: Diagnosis not present

## 2021-03-11 DIAGNOSIS — G894 Chronic pain syndrome: Secondary | ICD-10-CM | POA: Diagnosis not present

## 2021-04-05 DIAGNOSIS — Z79899 Other long term (current) drug therapy: Secondary | ICD-10-CM | POA: Diagnosis not present

## 2021-04-05 DIAGNOSIS — F17210 Nicotine dependence, cigarettes, uncomplicated: Secondary | ICD-10-CM | POA: Diagnosis not present

## 2021-04-05 DIAGNOSIS — Z6822 Body mass index (BMI) 22.0-22.9, adult: Secondary | ICD-10-CM | POA: Diagnosis not present

## 2021-04-05 DIAGNOSIS — M545 Low back pain, unspecified: Secondary | ICD-10-CM | POA: Diagnosis not present

## 2021-04-07 DIAGNOSIS — B078 Other viral warts: Secondary | ICD-10-CM | POA: Diagnosis not present

## 2021-04-18 DIAGNOSIS — Z23 Encounter for immunization: Secondary | ICD-10-CM | POA: Diagnosis not present

## 2021-04-21 DIAGNOSIS — J449 Chronic obstructive pulmonary disease, unspecified: Secondary | ICD-10-CM | POA: Diagnosis not present

## 2021-04-21 DIAGNOSIS — G894 Chronic pain syndrome: Secondary | ICD-10-CM | POA: Diagnosis not present

## 2021-04-21 DIAGNOSIS — M1991 Primary osteoarthritis, unspecified site: Secondary | ICD-10-CM | POA: Diagnosis not present

## 2021-05-10 DIAGNOSIS — Z79899 Other long term (current) drug therapy: Secondary | ICD-10-CM | POA: Diagnosis not present

## 2021-05-10 DIAGNOSIS — F17210 Nicotine dependence, cigarettes, uncomplicated: Secondary | ICD-10-CM | POA: Diagnosis not present

## 2021-05-10 DIAGNOSIS — M545 Low back pain, unspecified: Secondary | ICD-10-CM | POA: Diagnosis not present

## 2021-05-10 DIAGNOSIS — Z6822 Body mass index (BMI) 22.0-22.9, adult: Secondary | ICD-10-CM | POA: Diagnosis not present

## 2021-06-07 DIAGNOSIS — Z6822 Body mass index (BMI) 22.0-22.9, adult: Secondary | ICD-10-CM | POA: Diagnosis not present

## 2021-06-07 DIAGNOSIS — Z79899 Other long term (current) drug therapy: Secondary | ICD-10-CM | POA: Diagnosis not present

## 2021-06-07 DIAGNOSIS — F17210 Nicotine dependence, cigarettes, uncomplicated: Secondary | ICD-10-CM | POA: Diagnosis not present

## 2021-06-07 DIAGNOSIS — M545 Low back pain, unspecified: Secondary | ICD-10-CM | POA: Diagnosis not present

## 2021-07-05 DIAGNOSIS — Z79899 Other long term (current) drug therapy: Secondary | ICD-10-CM | POA: Diagnosis not present

## 2021-07-05 DIAGNOSIS — Z0000 Encounter for general adult medical examination without abnormal findings: Secondary | ICD-10-CM | POA: Diagnosis not present

## 2021-07-05 DIAGNOSIS — M545 Low back pain, unspecified: Secondary | ICD-10-CM | POA: Diagnosis not present

## 2021-07-05 DIAGNOSIS — Z6822 Body mass index (BMI) 22.0-22.9, adult: Secondary | ICD-10-CM | POA: Diagnosis not present

## 2021-07-05 DIAGNOSIS — F17210 Nicotine dependence, cigarettes, uncomplicated: Secondary | ICD-10-CM | POA: Diagnosis not present

## 2021-07-14 DIAGNOSIS — Z0001 Encounter for general adult medical examination with abnormal findings: Secondary | ICD-10-CM | POA: Diagnosis not present

## 2021-07-14 DIAGNOSIS — J449 Chronic obstructive pulmonary disease, unspecified: Secondary | ICD-10-CM | POA: Diagnosis not present

## 2021-07-14 DIAGNOSIS — G894 Chronic pain syndrome: Secondary | ICD-10-CM | POA: Diagnosis not present

## 2021-07-14 DIAGNOSIS — M1991 Primary osteoarthritis, unspecified site: Secondary | ICD-10-CM | POA: Diagnosis not present

## 2021-07-18 ENCOUNTER — Ambulatory Visit
Admission: EM | Admit: 2021-07-18 | Discharge: 2021-07-18 | Disposition: A | Discharge status: Home / Self Care | Payer: Medicare Other

## 2021-07-18 ENCOUNTER — Other Ambulatory Visit: Payer: Self-pay

## 2021-07-18 ENCOUNTER — Ambulatory Visit (INDEPENDENT_AMBULATORY_CARE_PROVIDER_SITE_OTHER): Payer: Medicare Other

## 2021-07-18 DIAGNOSIS — R079 Chest pain, unspecified: Secondary | ICD-10-CM

## 2021-07-18 DIAGNOSIS — R0682 Tachypnea, not elsewhere classified: Secondary | ICD-10-CM | POA: Diagnosis not present

## 2021-07-18 DIAGNOSIS — J189 Pneumonia, unspecified organism: Secondary | ICD-10-CM | POA: Diagnosis not present

## 2021-07-18 DIAGNOSIS — J90 Pleural effusion, not elsewhere classified: Secondary | ICD-10-CM | POA: Diagnosis not present

## 2021-07-18 DIAGNOSIS — J439 Emphysema, unspecified: Secondary | ICD-10-CM | POA: Diagnosis not present

## 2021-07-18 DIAGNOSIS — J449 Chronic obstructive pulmonary disease, unspecified: Secondary | ICD-10-CM | POA: Diagnosis not present

## 2021-07-18 DIAGNOSIS — R0602 Shortness of breath: Secondary | ICD-10-CM

## 2021-07-18 MED ORDER — GUAIFENESIN ER 600 MG PO TB12: 600.0000 mg | ORAL_TABLET | Freq: Two times a day (BID) | ORAL | 0 refills | Status: DC | PRN

## 2021-07-18 MED ORDER — PROMETHAZINE-DM 6.25-15 MG/5ML PO SYRP: 2.5000 mL | ORAL_SOLUTION | Freq: Four times a day (QID) | ORAL | 0 refills | Status: DC | PRN

## 2021-07-18 MED ORDER — LEVOFLOXACIN 500 MG PO TABS: 500.0000 mg | ORAL_TABLET | Freq: Every day | ORAL | 0 refills | Status: DC

## 2021-07-18 NOTE — Discharge Instructions (Addendum)
Your chest x-ray today shows that you have a right middle lobe pneumonia.  I have sent in antibiotics, Mucinex and a cough syrup.  Continue your inhaler regimen and follow-up with your primary care provider for a recheck in the next few days.  If you are symptoms are worsening go to the emergency department for further management

## 2021-07-18 NOTE — ED Triage Notes (Signed)
Pt reports pain under right breast and shortness of breath x 2 days. Pt reports he was seeing by his PCP 4 days ago and he felt fine. Pt reporst he was in "the rain a lot 3 days ago, sty in bed till 4 pm".

## 2021-07-18 NOTE — ED Provider Notes (Signed)
[facility]    CSN: [id number] Arrival date & time: 07/18/21  1119      History   Chief Complaint Chief Complaint  Patient presents with   Shortness of Breath    HPI Joshua [name] is a 71 y.o. male.   Patient presenting today with 3-day history of progressively worsening right-sided chest pain, chest tightness, shortness of breath, cough.  He states he has been in the rainy weather a lot in the past few days and thinks this got him sick.  Has been fatigued, having trouble getting up and moving around the past few days.  History of COPD on albuterol, Trelegy which he states has not been helping.  Not trying anything else over-the-counter for symptoms.  Denies fever, abdominal pain, nausea vomiting or diarrhea, nasal congestion, sore throat.   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   Postlaminectomy syndrome, lumbar region 11/13/2012   Thoracic or lumbosacral neuritis or radiculitis, unspecified 11/13/2012   Lumbosacral spondylosis without myelopathy 11/13/2012   Tobacco abuse 11/13/2012   Unspecified hereditary and idiopathic peripheral neuropathy 11/13/2012    Past Surgical History:  Procedure Laterality Date   BACK SURGERY     BLADDER SURGERY     hard of hearing     HYDROCELE EXCISION Right 11/26/2018   Procedure: RIGHT HYDROCELECTOMY;  Surgeon: Franchot Gallo, MD;  Location: [facility];  Service: Urology;   Laterality: Right;   NASAL SINUS SURGERY         Home Medications    Prior to Admission medications   Medication Sig Start Date End Date Taking? Authorizing Provider  guaiFENesin (MUCINEX) 600 MG 12 hr tablet Take 1 tablet (600 mg total) by mouth 2 (two) times daily as needed. 07/18/21  Yes Volney American, PA-C  levofloxacin (LEVAQUIN) 500 MG tablet Take 1 tablet (500 mg total) by mouth daily. 07/18/21  Yes Volney American, PA-C  promethazine-dextromethorphan (PROMETHAZINE-DM) 6.25-15 MG/5ML syrup Take 2.5 mLs by mouth 4 (four) times daily as needed. 07/18/21  Yes Volney American, PA-C  albuterol (PROVENTIL HFA;VENTOLIN HFA) 108 (90 BASE) MCG/ACT inhaler Inhale 2 puffs into the lungs every 6 (six) hours as needed for wheezing or shortness of breath.     [provider]  cephALEXin (KEFLEX) 500 MG capsule Take 1 capsule (500 mg total) by mouth 2 (two) times daily. 11/26/18   Franchot Gallo, MD  diazepam (VALIUM) 10 MG tablet Take 10 mg by mouth at bedtime.    [provider]  DULoxetine (CYMBALTA) 30 MG capsule Take 30 mg by mouth daily. 06/23/21   [provider]  HYDROcodone-acetaminophen (NORCO) 10-325 MG tablet Take 1 tablet by mouth every 6 (six) hours as needed.    [provider]  meclizine (ANTIVERT) 25 MG tablet Take 25 mg by mouth 3 (three) times daily  as needed for dizziness.    [provider]  sildenafil (VIAGRA) 50 MG tablet Take 50-100 mg by mouth daily as needed. 07/14/21   [provider]  Donnal Debar 200-62.5-25 MCG/ACT AEPB Inhale 1 puff into the lungs daily. 07/14/21   [provider]    Family History History reviewed. No pertinent family history.  Social History Social History   Tobacco Use   Smoking status: Every Day    Packs/day: 1.00    Types: Cigarettes   Smokeless tobacco: Current  Substance Use Topics   Alcohol use: No   Drug use: Never     Allergies   Patient has no  known allergies.   Review of Systems Review of Systems Per HPI  Physical Exam Triage Vital Signs ED Triage Vitals  Enc Vitals Group     BP 07/18/21 1305 129/71     Pulse Rate 07/18/21 1305 90     Resp 07/18/21 1305 (!) 24     Temp 07/18/21 1305 97.9 F (36.6 C)     Temp Source 07/18/21 1305 Oral     SpO2 07/18/21 1305 91 %     Weight --      Height --      Head Circumference --      Peak Flow --      Pain Score 07/18/21 1306 8     Pain Loc --      Pain Edu? --      Excl. in [place]? --    No data found.  Updated Vital Signs BP 129/71 (BP Location: Right Arm)    Pulse 90    Temp 97.9 F (36.6 C) (Oral)    Resp (!) 24    SpO2 91%   Visual Acuity Right Eye Distance:   Left Eye Distance:   Bilateral Distance:    Right Eye Near:   Left Eye Near:    Bilateral Near:     Physical Exam Vitals and nursing note reviewed.  Constitutional:      Appearance: Normal appearance.  HENT:     Head: Atraumatic.     Nose: Nose normal.     Mouth/Throat:     Mouth: Mucous membranes are moist.     Pharynx: Oropharynx is clear.  Eyes:     Extraocular Movements: Extraocular movements intact.     Conjunctiva/sclera: Conjunctivae normal.  Cardiovascular:     Rate and Rhythm: Normal rate and regular rhythm.  Pulmonary:     Effort: Pulmonary effort is normal. No respiratory distress.     Breath sounds: Rales present.     Comments: Right-sided Rales Musculoskeletal:        General: Normal range of motion.     Cervical back: Normal range of motion and neck supple.  Skin:    General: Skin is warm and dry.  Neurological:     General: No focal deficit present.     Mental Status: He is oriented to person, place, and time.  Psychiatric:        Mood and Affect: Mood normal.        Thought Content: Thought content normal.        Judgment: Judgment normal.     UC Treatments / Results  Labs (all labs ordered are listed, but only abnormal results are displayed) Labs Reviewed - No data  to display  EKG   Radiology DG Chest 2 View  Result Date: 07/18/2021 CLINICAL DATA:  Right-sided chest pain, shortness of breath x3  days EXAM: CHEST - 2 VIEW COMPARISON:  None. FINDINGS: The cardiomediastinal silhouette is within normal limits. Endobronchial valves noted in the right medial lower lung. There is right middle lobe airspace disease. Trace right pleural effusion. No left pleural effusion. No visible pneumothorax. Emphysema. No acute osseous abnormality. IMPRESSION: Right middle lobe pneumonia. Recommend follow-up PA and lateral chest radiograph in 6-12 weeks to ensure resolution. Electronically Signed   By: Maurine Simmering M.D.   On: 07/18/2021 13:38    Procedures Procedures (including critical care time)  Medications Ordered in UC Medications - No data to display  Initial Impression / Assessment and Plan / UC Course  I have reviewed the triage vital signs and the nursing notes.  Pertinent labs & imaging results that were available during my care of the patient were reviewed by me and considered in my medical decision making (see chart for details).     Mildly tachypneic in triage, oxygen saturation 91% on room air.  He is breathing comfortably and in no acute distress.  Chest x-ray showing a right middle lobe pneumonia.  We will treat with Levaquin, Phenergan DM, Mucinex and follow-up with PCP for recheck in the next few days.  ED for any worsening symptoms.  Final Clinical Impressions(s) / UC Diagnoses   Final diagnoses:  Pneumonia of right middle lobe due to infectious organism  Tachypnea  SOB (shortness of breath)  Chronic obstructive pulmonary disease, unspecified COPD type [facility])     Discharge Instructions      Your chest x-ray today shows that you have a right middle lobe pneumonia.  I have sent in antibiotics, Mucinex and a cough syrup.  Continue your inhaler regimen and follow-up with your primary care provider for a recheck in the next few days.  If you are  symptoms are worsening go to the emergency department for further management    ED Prescriptions     Medication Sig Dispense Auth. Provider   levofloxacin (LEVAQUIN) 500 MG tablet Take 1 tablet (500 mg total) by mouth daily. 7 tablet Volney American, PA-C   guaiFENesin (MUCINEX) 600 MG 12 hr tablet Take 1 tablet (600 mg total) by mouth 2 (two) times daily as needed. 30 tablet Volney American, [place]   promethazine-dextromethorphan (PROMETHAZINE-DM) 6.25-15 MG/5ML syrup Take 2.5 mLs by mouth 4 (four) times daily as needed. 50 mL Volney American, [place]      PDMP not reviewed this encounter.   Volney American, [place] 07/18/21 1357

## 2021-07-26 DIAGNOSIS — D509 Iron deficiency anemia, unspecified: Secondary | ICD-10-CM | POA: Diagnosis not present

## 2021-07-29 DIAGNOSIS — D509 Iron deficiency anemia, unspecified: Secondary | ICD-10-CM | POA: Diagnosis not present

## 2021-07-29 DIAGNOSIS — G894 Chronic pain syndrome: Secondary | ICD-10-CM | POA: Diagnosis not present

## 2021-07-29 DIAGNOSIS — J449 Chronic obstructive pulmonary disease, unspecified: Secondary | ICD-10-CM | POA: Diagnosis not present

## 2021-07-29 DIAGNOSIS — M1991 Primary osteoarthritis, unspecified site: Secondary | ICD-10-CM | POA: Diagnosis not present

## 2021-08-02 DIAGNOSIS — Z79899 Other long term (current) drug therapy: Secondary | ICD-10-CM | POA: Diagnosis not present

## 2021-08-02 DIAGNOSIS — F17210 Nicotine dependence, cigarettes, uncomplicated: Secondary | ICD-10-CM | POA: Diagnosis not present

## 2021-08-02 DIAGNOSIS — Z6822 Body mass index (BMI) 22.0-22.9, adult: Secondary | ICD-10-CM | POA: Diagnosis not present

## 2021-08-02 DIAGNOSIS — M545 Low back pain, unspecified: Secondary | ICD-10-CM | POA: Diagnosis not present

## 2021-08-16 ENCOUNTER — Encounter (HOSPITAL_COMMUNITY): Payer: Self-pay | Admitting: Radiology

## 2021-08-30 DIAGNOSIS — Z79899 Other long term (current) drug therapy: Secondary | ICD-10-CM | POA: Diagnosis not present

## 2021-08-30 DIAGNOSIS — M545 Low back pain, unspecified: Secondary | ICD-10-CM | POA: Diagnosis not present

## 2021-08-30 DIAGNOSIS — F17210 Nicotine dependence, cigarettes, uncomplicated: Secondary | ICD-10-CM | POA: Diagnosis not present

## 2021-08-30 DIAGNOSIS — Z6822 Body mass index (BMI) 22.0-22.9, adult: Secondary | ICD-10-CM | POA: Diagnosis not present

## 2021-09-12 DIAGNOSIS — C44311 Basal cell carcinoma of skin of nose: Secondary | ICD-10-CM | POA: Diagnosis not present

## 2021-09-12 DIAGNOSIS — B078 Other viral warts: Secondary | ICD-10-CM | POA: Diagnosis not present

## 2021-09-27 DIAGNOSIS — F17210 Nicotine dependence, cigarettes, uncomplicated: Secondary | ICD-10-CM | POA: Diagnosis not present

## 2021-09-27 DIAGNOSIS — M545 Low back pain, unspecified: Secondary | ICD-10-CM | POA: Diagnosis not present

## 2021-09-27 DIAGNOSIS — Z6822 Body mass index (BMI) 22.0-22.9, adult: Secondary | ICD-10-CM | POA: Diagnosis not present

## 2021-09-27 DIAGNOSIS — Z79899 Other long term (current) drug therapy: Secondary | ICD-10-CM | POA: Diagnosis not present

## 2021-10-24 DIAGNOSIS — J449 Chronic obstructive pulmonary disease, unspecified: Secondary | ICD-10-CM | POA: Diagnosis not present

## 2021-10-24 DIAGNOSIS — G894 Chronic pain syndrome: Secondary | ICD-10-CM | POA: Diagnosis not present

## 2021-10-24 DIAGNOSIS — M1991 Primary osteoarthritis, unspecified site: Secondary | ICD-10-CM | POA: Diagnosis not present

## 2021-10-24 DIAGNOSIS — C44311 Basal cell carcinoma of skin of nose: Secondary | ICD-10-CM | POA: Diagnosis not present

## 2021-10-25 DIAGNOSIS — F17210 Nicotine dependence, cigarettes, uncomplicated: Secondary | ICD-10-CM | POA: Diagnosis not present

## 2021-10-25 DIAGNOSIS — Z6822 Body mass index (BMI) 22.0-22.9, adult: Secondary | ICD-10-CM | POA: Diagnosis not present

## 2021-10-25 DIAGNOSIS — M545 Low back pain, unspecified: Secondary | ICD-10-CM | POA: Diagnosis not present

## 2021-10-25 DIAGNOSIS — Z79899 Other long term (current) drug therapy: Secondary | ICD-10-CM | POA: Diagnosis not present

## 2021-11-22 DIAGNOSIS — Z6822 Body mass index (BMI) 22.0-22.9, adult: Secondary | ICD-10-CM | POA: Diagnosis not present

## 2021-11-22 DIAGNOSIS — Z79899 Other long term (current) drug therapy: Secondary | ICD-10-CM | POA: Diagnosis not present

## 2021-11-22 DIAGNOSIS — M545 Low back pain, unspecified: Secondary | ICD-10-CM | POA: Diagnosis not present

## 2021-11-22 DIAGNOSIS — F17210 Nicotine dependence, cigarettes, uncomplicated: Secondary | ICD-10-CM | POA: Diagnosis not present

## 2021-12-17 DIAGNOSIS — Z6822 Body mass index (BMI) 22.0-22.9, adult: Secondary | ICD-10-CM | POA: Diagnosis not present

## 2021-12-17 DIAGNOSIS — F17210 Nicotine dependence, cigarettes, uncomplicated: Secondary | ICD-10-CM | POA: Diagnosis not present

## 2021-12-17 DIAGNOSIS — Z79899 Other long term (current) drug therapy: Secondary | ICD-10-CM | POA: Diagnosis not present

## 2021-12-17 DIAGNOSIS — M545 Low back pain, unspecified: Secondary | ICD-10-CM | POA: Diagnosis not present

## 2021-12-29 DIAGNOSIS — S1086XA Insect bite of other specified part of neck, initial encounter: Secondary | ICD-10-CM | POA: Diagnosis not present

## 2021-12-29 DIAGNOSIS — C44311 Basal cell carcinoma of skin of nose: Secondary | ICD-10-CM | POA: Diagnosis not present

## 2022-01-06 ENCOUNTER — Other Ambulatory Visit (HOSPITAL_COMMUNITY): Payer: Self-pay | Admitting: Internal Medicine

## 2022-01-06 ENCOUNTER — Ambulatory Visit (HOSPITAL_COMMUNITY)
Admission: RE | Admit: 2022-01-06 | Discharge: 2022-01-06 | Disposition: A | Discharge status: Home / Self Care | Payer: Medicare Other | Source: Ambulatory Visit | Attending: Internal Medicine | Admitting: Internal Medicine

## 2022-01-06 DIAGNOSIS — R053 Chronic cough: Secondary | ICD-10-CM | POA: Diagnosis not present

## 2022-01-06 DIAGNOSIS — M1991 Primary osteoarthritis, unspecified site: Secondary | ICD-10-CM | POA: Diagnosis not present

## 2022-01-06 DIAGNOSIS — J449 Chronic obstructive pulmonary disease, unspecified: Secondary | ICD-10-CM | POA: Diagnosis not present

## 2022-01-06 DIAGNOSIS — C679 Malignant neoplasm of bladder, unspecified: Secondary | ICD-10-CM | POA: Diagnosis not present

## 2022-01-17 DIAGNOSIS — Z79899 Other long term (current) drug therapy: Secondary | ICD-10-CM | POA: Diagnosis not present

## 2022-01-17 DIAGNOSIS — F17210 Nicotine dependence, cigarettes, uncomplicated: Secondary | ICD-10-CM | POA: Diagnosis not present

## 2022-01-17 DIAGNOSIS — M545 Low back pain, unspecified: Secondary | ICD-10-CM | POA: Diagnosis not present

## 2022-01-17 DIAGNOSIS — Z6822 Body mass index (BMI) 22.0-22.9, adult: Secondary | ICD-10-CM | POA: Diagnosis not present

## 2022-02-02 DIAGNOSIS — D485 Neoplasm of uncertain behavior of skin: Secondary | ICD-10-CM | POA: Diagnosis not present

## 2022-02-02 DIAGNOSIS — C44321 Squamous cell carcinoma of skin of nose: Secondary | ICD-10-CM | POA: Diagnosis not present

## 2022-02-14 DIAGNOSIS — Z79899 Other long term (current) drug therapy: Secondary | ICD-10-CM | POA: Diagnosis not present

## 2022-02-14 DIAGNOSIS — M545 Low back pain, unspecified: Secondary | ICD-10-CM | POA: Diagnosis not present

## 2022-02-14 DIAGNOSIS — F17210 Nicotine dependence, cigarettes, uncomplicated: Secondary | ICD-10-CM | POA: Diagnosis not present

## 2022-02-14 DIAGNOSIS — Z6822 Body mass index (BMI) 22.0-22.9, adult: Secondary | ICD-10-CM | POA: Diagnosis not present

## 2022-03-09 ENCOUNTER — Ambulatory Visit: Payer: Medicare Other | Admitting: Internal Medicine

## 2022-03-09 ENCOUNTER — Encounter: Payer: Self-pay | Admitting: Internal Medicine

## 2022-03-09 VITALS — BP 136/88 | HR 94 | Temp 98.7°F | Ht 71.0 in | Wt 183.1 lb

## 2022-03-09 DIAGNOSIS — Z23 Encounter for immunization: Secondary | ICD-10-CM | POA: Diagnosis not present

## 2022-03-09 DIAGNOSIS — R8290 Unspecified abnormal findings in urine: Secondary | ICD-10-CM | POA: Diagnosis not present

## 2022-03-09 DIAGNOSIS — M549 Dorsalgia, unspecified: Secondary | ICD-10-CM | POA: Diagnosis not present

## 2022-03-09 DIAGNOSIS — R0609 Other forms of dyspnea: Secondary | ICD-10-CM

## 2022-03-09 DIAGNOSIS — J449 Chronic obstructive pulmonary disease, unspecified: Secondary | ICD-10-CM | POA: Diagnosis not present

## 2022-03-09 DIAGNOSIS — F17210 Nicotine dependence, cigarettes, uncomplicated: Secondary | ICD-10-CM

## 2022-03-09 MED ORDER — TRELEGY ELLIPTA 100-62.5-25 MCG/ACT IN AEPB: INHALATION_SPRAY | RESPIRATORY_TRACT | 11 refills | Status: DC

## 2022-03-09 NOTE — Progress Notes (Unsigned)
Joshua [name], male    DOB: 1951-06-04    MRN: [id number]   Brief patient profile:  66  yowm  active smoker  self referred to pulmonary clinic in [place]  03/09/2022 for doe/cough     History of Present Illness  03/09/2022  Pulmonary/ 1st office eval/ [name] / [facility]  trelegy 200 prn  Chief Complaint  Patient presents with   Consult    Hx of pneumonia. Cough, SOB.   Dyspnea:  denies limitation and not using any maint meds at this point Apparently no cough now nor cp but keeps talking about past symptoms he wants checked out  eg pain on R chest  > ER 07/18/21 for minimal scarring R  minor fissure   No further hx available from pt even with sister's help    Past Medical History:  Diagnosis Date   Back pain    Cancer ([place])    bladder in the past    Chronic pain    COPD (chronic obstructive pulmonary disease) ([facility])    Meniere's disease    PONV (postoperative nausea and vomiting)    Stroke ([place])    30 years ago    Outpatient Medications Prior to Visit - - NOTE:   Unable to verify as accurately reflecting what pt takes    Medication Sig Dispense Refill   albuterol (PROVENTIL HFA;VENTOLIN HFA) 108 (90 BASE) MCG/ACT inhaler Inhale 2 puffs into the lungs every 6 (six) hours as needed for wheezing or shortness of breath.      diazepam (VALIUM) 10 MG tablet Take 10 mg by mouth at bedtime.     DULoxetine (CYMBALTA) 30 MG capsule Take 30 mg by mouth daily.     guaiFENesin (MUCINEX) 600 MG 12 hr tablet Take 1 tablet (600 mg total) by mouth 2 (two) times daily as needed. 30 tablet 0   HYDROcodone-acetaminophen (NORCO) 10-325 MG tablet Take 1 tablet by mouth every 6 (six) hours as needed.     meclizine (ANTIVERT) 25 MG tablet Take 25 mg by mouth 3 (three) times daily as needed for dizziness.     sildenafil (VIAGRA) 50 MG tablet Take 50-100 mg by mouth daily as needed.     TRELEGY ELLIPTA 200-62.5-25 MCG/ACT AEPB Inhale 1 puff into the lungs daily.     cephALEXin (KEFLEX) 500 MG  capsule Take 1 capsule (500 mg total) by mouth 2 (two) times daily. 10 capsule 0   levofloxacin (LEVAQUIN) 500 MG tablet Take 1 tablet (500 mg total) by mouth daily. 7 tablet 0   promethazine-dextromethorphan (PROMETHAZINE-DM) 6.25-15 MG/5ML syrup Take 2.5 mLs by mouth 4 (four) times daily as needed. 50 mL 0   No facility-administered medications prior to visit.     Objective:     BP 136/88 (BP Location: Left Arm, Patient Position: Sitting)   Pulse 94   Temp 98.7 F (37.1 C) (Temporal)   Ht '5\' 11"'$  (1.803 m)   Wt 183 lb 1.6 oz (83.1 kg)   SpO2 96% Comment: ra  BMI 25.54 kg/m   SpO2: 96 % (ra)  Amb wm extremely difficult to communicate, can't read sign language so sister just yells loudly to "translate"   HEENT : Oropharynx  clear/ full dentures   Nasal turbinates nl    NECK :  without  apparent JVD/ palpable Nodes/TM    LUNGS: no acc muscle use,  Min barrel  contour chest wall with bilateral  slightly decreased bs s audible wheeze and  without  cough on insp or exp maneuvers and min  Hyperresonant  to  percussion bilaterally    CV:  RRR  no s3 or murmur or increase in P2, and no edema   ABD:  soft and nontender with pos end  insp Hoover's  in the supine position.  No bruits or organomegaly appreciated   MS:  Nl gait/ ext warm without deformities Or obvious joint restrictions  calf tenderness, cyanosis or clubbing     SKIN: warm and dry without lesions    NEURO:  alert, approp, nl sensorium with  no motor or cerebellar deficits apparent.        CXR PA and Lateral:   03/09/2022 :    I personally reviewed images and impression is as follows:     No acute findings        Assessment   No problem-specific Assessment & Plan notes found for this encounter.     Christinia Gully, MD 03/09/2022

## 2022-03-09 NOTE — Patient Instructions (Addendum)
Plan A = Automatic = Always=   Trelegy 919 one click first thing each AM (not as needed)   Work on inhaler technique:  relax and gently blow all the way out then take a nice smooth full deep breath back in.  Hold breath in for at least  5 seconds if you can. Blow out Trelegy  thru nose. Rinse and gargle with water when done.  If mouth or throat bother you at all,  try brushing teeth/gums/tongue with arm and hammer toothpaste/ make a slurry and gargle and spit out.      Plan B = Backup (to supplement plan A, not to replace it) Only use your albuterol inhaler as a rescue medication to be used if you can't catch your breath by resting or doing a relaxed purse lip breathing pattern.  - The less you use it, the better it will work when you need it. - Ok to use the inhaler up to 2 puffs  every 4 hours if you must but call for appointment if use goes up over your usual need - Don't leave home without it !!  (think of it like the spare tire for your car)   For cough > mucinex dm 1200 mg every 12 hours as needed   If cough continues rec Try prilosec otc '20mg'$   Take 30-60 min before first meal of the day and Pepcid ac (famotidine) 20 mg one @  bedtime until cough is completely gone for at least a week without the need for cough suppression      The key is to stop smoking completely before smoking completely stops you!   Please remember to go to the  x-ray department  for your tests - we will call you with the results when they are available    Please schedule a follow up office visit in 4 weeks, sooner if needed  with all medications /inhalers/ solutions in hand so we can verify exactly what you are taking. This includes all medications from all doctors and over the counters

## 2022-03-10 ENCOUNTER — Ambulatory Visit (HOSPITAL_COMMUNITY)
Admission: RE | Admit: 2022-03-10 | Discharge: 2022-03-10 | Disposition: A | Discharge status: Home / Self Care | Payer: Medicare Other | Source: Ambulatory Visit | Attending: Internal Medicine | Admitting: Internal Medicine

## 2022-03-10 DIAGNOSIS — R0609 Other forms of dyspnea: Secondary | ICD-10-CM | POA: Diagnosis not present

## 2022-03-10 DIAGNOSIS — R0602 Shortness of breath: Secondary | ICD-10-CM | POA: Diagnosis not present

## 2022-03-10 DIAGNOSIS — R059 Cough, unspecified: Secondary | ICD-10-CM | POA: Diagnosis not present

## 2022-03-10 NOTE — Assessment & Plan Note (Signed)
Active smoker  - 03/09/2022   Walked on RA   x  3  lap(s) =  approx 450  ft  @ mod pace, stopped due to end of study  with lowest 02 sats 92% and no sob    DDX of  difficult airways management almost all start with A and  include Adherence, Ace Inhibitors, Acid Reflux, Active Sinus Disease, Alpha 1 Antitripsin deficiency, Anxiety masquerading as Airways dz,  ABPA,  Allergy(esp in young), Aspiration (esp in elderly), Adverse effects of meds,  Active smoking or vaping, A bunch of PE's (a small clot burden can't cause this syndrome unless there is already severe underlying pulm or vascular dz with poor reserve) plus two Bs  = Bronchiectasis and Beta blocker use..and one C= CHF   Adherence is always the initial "prime suspect" and is a multilayered concern that requires a "trust but verify" approach in every patient - starting with knowing how to use medications, especially inhalers, correctly, keeping up with refills and understanding the fundamental difference between maintenance and prns vs those medications only taken for a very short course and then stopped and not refilled.  - - The proper method of use, as well as anticipated side effects, of a  DPI (elipta)  inhaler were discussed and demonstrated to the patient using teach back method. Improved effectiveness after extensive coaching during this visit to a level of approximately 90 % from a baseline of 50 % > continue trelegy 100 since easiest to use   Active smoking at top of the rest of the list of suspects (see separate a/p)   Allergy screen 03/09/2022 >  Eos 0. /  IgE    ? Alpha One AT def > send phenotype

## 2022-03-12 ENCOUNTER — Encounter: Payer: Self-pay | Admitting: Internal Medicine

## 2022-03-12 DIAGNOSIS — F17210 Nicotine dependence, cigarettes, uncomplicated: Secondary | ICD-10-CM | POA: Insufficient documentation

## 2022-03-12 LAB — BASIC METABOLIC PANEL
BUN/Creatinine Ratio: 9 — ABNORMAL LOW (ref 10–24)
BUN: 13 mg/dL (ref 8–27)
CO2: 22 mmol/L (ref 20–29)
Calcium: 9.9 mg/dL (ref 8.6–10.2)
Chloride: 101 mmol/L (ref 96–106)
Creatinine, Ser: 1.47 mg/dL — ABNORMAL HIGH (ref 0.76–1.27)
Glucose: 160 mg/dL — ABNORMAL HIGH (ref 70–99)
Potassium: 4.8 mmol/L (ref 3.5–5.2)
Sodium: 138 mmol/L (ref 134–144)
eGFR: 51 mL/min/{1.73_m2} — ABNORMAL LOW (ref >59)

## 2022-03-12 LAB — CBC WITH DIFFERENTIAL/PLATELET
Basophils Absolute: 0 10*3/uL (ref 0.0–0.2)
Basos: 0 %
EOS (ABSOLUTE): 0 10*3/uL (ref 0.0–0.4)
Eos: 0 %
Hematocrit: 35.8 % — ABNORMAL LOW (ref 37.5–51.0)
Hemoglobin: 11.7 g/dL — ABNORMAL LOW (ref 13.0–17.7)
Immature Grans (Abs): 0 10*3/uL (ref 0.0–0.1)
Immature Granulocytes: 0 %
Lymphocytes Absolute: 0.4 10*3/uL — ABNORMAL LOW (ref 0.7–3.1)
Lymphs: 5 %
MCH: 29.4 pg (ref 26.6–33.0)
MCHC: 32.7 g/dL (ref 31.5–35.7)
MCV: 90 fL (ref 79–97)
Monocytes Absolute: 0.1 10*3/uL (ref 0.1–0.9)
Monocytes: 1 %
Neutrophils Absolute: 6.2 10*3/uL (ref 1.4–7.0)
Neutrophils: 94 %
Platelets: 229 10*3/uL (ref 150–450)
RBC: 3.98 x10E6/uL — ABNORMAL LOW (ref 4.14–5.80)
RDW: 13.8 % (ref 11.6–15.4)
WBC: 6.7 10*3/uL (ref 3.4–10.8)

## 2022-03-12 LAB — TSH: TSH: 0.83 u[IU]/mL (ref 0.450–4.500)

## 2022-03-12 LAB — IGE: IgE (Immunoglobulin E), Serum: 57 [IU]/mL (ref 6–495)

## 2022-03-12 LAB — SEDIMENTATION RATE: Sed Rate: 15 mm/hr (ref 0–30)

## 2022-03-12 LAB — BRAIN NATRIURETIC PEPTIDE: BNP: 24.6 pg/mL (ref 0.0–100.0)

## 2022-03-12 LAB — D-DIMER, QUANTITATIVE: D-DIMER: 0.89 mg/L FEU — ABNORMAL HIGH (ref 0.00–0.49)

## 2022-03-12 NOTE — Assessment & Plan Note (Signed)
Counseled re importance of smoking cessation but did not meet time criteria for separate billing     Each maintenance medication was reviewed in detail including emphasizing most importantly the difference between maintenance and prns and under what circumstances the prns are to be triggered using an action plan format where appropriate.  Total time for H and P, chart review, counseling, reviewing DPI(elipta) device(s) , directly observing portions of ambulatory 02 saturation study/ and generating customized AVS unique to this office visit / same day charting = 30 min

## 2022-03-13 DIAGNOSIS — Z08 Encounter for follow-up examination after completed treatment for malignant neoplasm: Secondary | ICD-10-CM | POA: Diagnosis not present

## 2022-03-13 DIAGNOSIS — Z85828 Personal history of other malignant neoplasm of skin: Secondary | ICD-10-CM | POA: Diagnosis not present

## 2022-03-14 DIAGNOSIS — M545 Low back pain, unspecified: Secondary | ICD-10-CM | POA: Diagnosis not present

## 2022-03-14 DIAGNOSIS — F17210 Nicotine dependence, cigarettes, uncomplicated: Secondary | ICD-10-CM | POA: Diagnosis not present

## 2022-03-14 DIAGNOSIS — Z6822 Body mass index (BMI) 22.0-22.9, adult: Secondary | ICD-10-CM | POA: Diagnosis not present

## 2022-03-14 DIAGNOSIS — J449 Chronic obstructive pulmonary disease, unspecified: Secondary | ICD-10-CM | POA: Diagnosis not present

## 2022-03-14 DIAGNOSIS — Z79899 Other long term (current) drug therapy: Secondary | ICD-10-CM | POA: Diagnosis not present

## 2022-03-14 DIAGNOSIS — C679 Malignant neoplasm of bladder, unspecified: Secondary | ICD-10-CM | POA: Diagnosis not present

## 2022-03-27 ENCOUNTER — Other Ambulatory Visit (HOSPITAL_COMMUNITY): Payer: Self-pay

## 2022-03-28 ENCOUNTER — Other Ambulatory Visit (HOSPITAL_BASED_OUTPATIENT_CLINIC_OR_DEPARTMENT_OTHER): Payer: Self-pay

## 2022-03-29 ENCOUNTER — Other Ambulatory Visit (HOSPITAL_BASED_OUTPATIENT_CLINIC_OR_DEPARTMENT_OTHER): Payer: Self-pay

## 2022-04-05 ENCOUNTER — Ambulatory Visit: Payer: Medicare Other | Admitting: Internal Medicine

## 2022-04-06 DIAGNOSIS — M545 Low back pain, unspecified: Secondary | ICD-10-CM | POA: Diagnosis not present

## 2022-04-06 DIAGNOSIS — K5641 Fecal impaction: Secondary | ICD-10-CM | POA: Diagnosis not present

## 2022-04-11 ENCOUNTER — Ambulatory Visit (INDEPENDENT_AMBULATORY_CARE_PROVIDER_SITE_OTHER): Payer: Medicare Other

## 2022-04-11 ENCOUNTER — Ambulatory Visit: Payer: Medicare Other | Admitting: Internal Medicine

## 2022-04-11 ENCOUNTER — Encounter: Payer: Self-pay | Admitting: Internal Medicine

## 2022-04-11 DIAGNOSIS — J439 Emphysema, unspecified: Secondary | ICD-10-CM | POA: Diagnosis not present

## 2022-04-11 DIAGNOSIS — F17210 Nicotine dependence, cigarettes, uncomplicated: Secondary | ICD-10-CM

## 2022-04-11 DIAGNOSIS — J449 Chronic obstructive pulmonary disease, unspecified: Secondary | ICD-10-CM

## 2022-04-11 DIAGNOSIS — R059 Cough, unspecified: Secondary | ICD-10-CM | POA: Diagnosis not present

## 2022-04-11 MED ORDER — PREDNISONE 10 MG PO TABS: ORAL_TABLET | ORAL | 0 refills | Status: DC

## 2022-04-11 MED ORDER — BEVESPI AEROSPHERE 9-4.8 MCG/ACT IN AERO: 2.0000 | INHALATION_SPRAY | Freq: Two times a day (BID) | RESPIRATORY_TRACT | 11 refills | Status: DC

## 2022-04-11 NOTE — Progress Notes (Unsigned)
Joshua [name], male    DOB: Apr 02, 1951    MRN: [id number]   Brief patient profile:  29  yowm  active smoker  self referred to pulmonary clinic in [place]  03/09/2022 for doe/cough     History of Present Illness  03/09/2022  Pulmonary/ 1st office eval/ [name] / [facility]  trelegy 200 prn  Chief Complaint  Patient presents with   Consult    Hx of pneumonia. Cough, SOB.   Dyspnea:  denies limitation and not using any maint meds at this point Apparently no cough now nor cp but keeps talking about past symptoms he wants checked out  eg pain on R chest  > ER 07/18/21 for minimal scarring R  minor fissure  No further hx available from pt even with sister's help  Rec Plan A = Automatic = Always=   Trelegy 528 one click first thing each AM (not as needed)  Work on inhaler technique:   Plan B = Backup (to supplement plan A, not to replace it) Only use your albuterol inhaler as a rescue medication  For cough > mucinex dm 1200 mg every 12 hours as needed  If cough continues rec Try prilosec otc '20mg'$   Take 30-60 min before first meal of the day and Pepcid ac (famotidine) 20 mg one @  bedtime until cough is completely gone for at least a week without the need for cough suppression The key is to stop smoking completely before smoking completely stops you!  Cxr nad    Please schedule a follow up office visit in 4 weeks, sooner if needed  with all medications /inhalers/ solutions in hand      04/11/2022  f/u ov/Candyce Gambino re: COPD gold 3  maint on trelegy 100 with freq exac  and recurrent "pna"  on Zpak since 04/06/21 finishing today / did not bring meds  Chief Complaint  Patient presents with   Follow-up    Pneumonia recurrent 04/06/2022.  Dyspnea:  not limited  Cough: lots of cough x ? X 2 months Sleeping: not long  SABA use: once a day  02: none  Covid status:  orinal only Lung cancer screening :  referred today     No obvious day to day or daytime variability or assoc excess/ purulent  sputum or mucus plugs or hemoptysis or cp or chest tightness, subjective wheeze or overt sinus or hb symptoms.   Sleeping  without nocturnal  or early am exacerbation  of respiratory  c/o's or need for noct saba. Also denies any obvious fluctuation of symptoms with weather or environmental changes or other aggravating or alleviating factors except as outlined above   No unusual exposure hx or h/o childhood pna/ asthma or knowledge of premature birth.  Current Allergies, Complete Past Medical History, Past Surgical History, Family History, and Social History were reviewed in [employer] record.  ROS  The following are not active complaints unless bolded Hoarseness, sore throat, dysphagia, dental problems, itching, sneezing,  nasal congestion or discharge of excess mucus or purulent secretions, ear ache,   fever, chills, sweats, unintended wt loss or wt gain, classically pleuritic or exertional cp,  orthopnea pnd or arm/hand swelling  or leg swelling, presyncope, palpitations, abdominal pain, anorexia, nausea, vomiting, diarrhea  or change in bowel habits or change in bladder habits, change in stools or change in urine, dysuria, hematuria,  rash, arthralgias, visual complaints, headache, numbness, weakness or ataxia or problems with walking or coordination,  change  in mood or  memory.        Current Meds  Medication Sig   albuterol (PROVENTIL HFA;VENTOLIN HFA) 108 (90 BASE) MCG/ACT inhaler Inhale 2 puffs into the lungs every 6 (six) hours as needed for wheezing or shortness of breath.    azithromycin (ZITHROMAX) 250 MG tablet Take 250 mg by mouth as directed.   diazepam (VALIUM) 10 MG tablet Take 10 mg by mouth at bedtime.   DULoxetine (CYMBALTA) 30 MG capsule Take 30 mg by mouth daily.   Fluticasone-Umeclidin-Vilant (TRELEGY ELLIPTA) 100-62.5-25 MCG/ACT AEPB One click each am   HYDROcodone-acetaminophen (NORCO) 10-325 MG tablet Take 1 tablet by mouth every 6 (six) hours as  needed.   meclizine (ANTIVERT) 25 MG tablet Take 25 mg by mouth 3 (three) times daily as needed for dizziness.   sildenafil (VIAGRA) 50 MG tablet Take 50-100 mg by mouth daily as needed.        Past Medical History:  Diagnosis Date   Back pain    Cancer ([place])    bladder in the past    Chronic pain    COPD (chronic obstructive pulmonary disease) (HCC)    Meniere's disease    PONV (postoperative nausea and vomiting)    Stroke (HCC)    30 years ago        Objective:     Wt Readings from Last 3 Encounters:  04/11/22 179 lb 9.6 oz (81.5 kg)  03/09/22 183 lb 1.6 oz (83.1 kg)  11/26/18 159 lb 13.3 oz (72.5 kg)      Vital signs reviewed  04/11/2022  - Note at rest 02 sats  96% on RA    General appearance:    amb wm / very HOH      HEENT : Oropharynx  clear  Nasal turbinates nl    NECK :  without  apparent JVD/ palpable Nodes/TM    LUNGS: no acc muscle use,  Mild barrel  contour chest wall with bilateral  Distant bs s audible wheeze and  without cough on insp or exp maneuvers  and mild  Hyperresonant  to  percussion bilaterally     CV:  RRR  no s3 or murmur or increase in P2, and no edema   ABD:  soft and nontender with pos end  insp Hoover's  in the supine position.  No bruits or organomegaly appreciated   MS:  Nl gait/ ext warm without deformities Or obvious joint restrictions  calf tenderness, cyanosis or clubbing     SKIN: warm and dry without lesions    NEURO:  alert, approp, nl sensorium with  no motor or cerebellar deficits apparent.             CXR PA and Lateral:   04/11/2022 :    I personally reviewed images and impression is as follows:     C/w mod copd         Assessment

## 2022-04-11 NOTE — Patient Instructions (Addendum)
Plan A = Automatic = Always=   bevespi Take 2 puffs first thing in am and then another 2 puffs about 12 hours later.     Work on inhaler technique:  relax and gently blow all the way out then take a nice smooth full deep breath back in.  Hold breath in for at least  5 seconds if you can. Blow out Trelegy  thru nose. Rinse and gargle with water when done.  If mouth or throat bother you at all,  try brushing teeth/gums/tongue with arm and hammer toothpaste/ make a slurry and gargle and spit out.      Plan B = Backup (to supplement plan A, not to replace it) Only use your albuterol inhaler as a rescue medication to be used if you can't catch your breath by resting or doing a relaxed purse lip breathing pattern.  - The less you use it, the better it will work when you need it. - Ok to use the inhaler up to 2 puffs  every 4 hours if you must but call for appointment if use goes up over your usual need - Don't leave home without it !!  (think of it like the spare tire for your car)   For cough > mucinex dm 1200 mg every 12 hours as needed   Prilosec otc '20mg'$   Take 30-60 min before first meal of the day and Pepcid ac (famotidine) 20 mg one @  bedtime until cough is completely gone for at least a week without the need for cough suppression  Prednisone 10 mg take  4 each am x 2 days,   2 each am x 2 days,  1 each am x 2 days and stop   The key is to stop smoking completely before smoking completely stops you!  Please remember to go to the  x-ray department  for your tests - we will call you with the results when they are available     Please schedule a follow up office visit in 4 weeks, sooner if needed  with all medications /inhalers/ solutions in hand so we can verify exactly what you are taking. This includes all medications from all doctors and over the counters to [employer]

## 2022-04-11 NOTE — Assessment & Plan Note (Signed)
Counseled re importance of smoking cessation but did not meet time criteria for separate billing    Low-dose CT lung cancer screening is recommended for patients who are 65-71 years of age with a 20+ pack-year history of smoking and who are currently smoking or quit <=15 years ago. No coughing up blood  No unintentional weight loss of > 15 pounds in the last 6 months - pt is eligible for scanning yearly until 70 y p quits  > referred assuming cxr is ok now, o/w needs a standard ct and then directed by findings after that per Fleischner criteria   Discussed in detail all the  indications, usual  risks and alternatives  relative to the benefits with patient who agrees to proceed with w/u as outlined.            Each maintenance medication was reviewed in detail including emphasizing most importantly the difference between maintenance and prns and under what circumstances the prns are to be triggered using an action plan format where appropriate.  Total time for H and P, chart review, counseling, reviewing hfa vs dpi  device(s) and generating customized AVS unique to this office visit / same day charting  > 30 min

## 2022-04-11 NOTE — Assessment & Plan Note (Addendum)
Active smoker  - 03/09/2022   Walked on RA   x  3  lap(s) =  approx 450  ft  @ mod pace, stopped due to end of study  with lowest 02 sats 92% and no sob   - 04/11/2022 d/c trelegy and start bevespi due to recurrent "pna " - Spirometry 04/11/2022  FEV1 1/48 (44%)  Ratio 0.48 prior to study with classic concavity  - .Labs ordered 04/11/2022  :     alpha one AT phenotype     Group D (now reclassified as E) in terms of symptom/risk and laba/lama/ICS  therefore appropriate rx at this point >>>  Lama/laba needed due to dx of recurrent "pna" and if cxr still abn > CT needed o/w can go into lung cancer screening program  Discussed in detail all the  indications, usual  risks and alternatives  relative to the benefits with patient/sister  who agree  to proceed with Rx as outlined.

## 2022-04-12 ENCOUNTER — Encounter: Payer: Self-pay | Admitting: Internal Medicine

## 2022-04-12 ENCOUNTER — Telehealth: Payer: Self-pay | Admitting: Internal Medicine

## 2022-04-12 NOTE — Telephone Encounter (Signed)
Pt's mother calling regarding x-ray results.  Please advise.   Dr. [name] please advise, have you had a chance to look at patients CXR?

## 2022-04-12 NOTE — Telephone Encounter (Signed)
Spoke with patients mother [place]  regarding CXR results. They verbalized understanding. No further questions.  Nothing further needed at this time.

## 2022-04-12 NOTE — Telephone Encounter (Signed)
See result note.  

## 2022-04-15 LAB — ALPHA-1-ANTITRYPSIN PHENOTYP: A-1 Antitrypsin: 197 mg/dL — ABNORMAL HIGH (ref 101–187)

## 2022-04-18 DIAGNOSIS — G8929 Other chronic pain: Secondary | ICD-10-CM | POA: Diagnosis not present

## 2022-04-18 DIAGNOSIS — F17210 Nicotine dependence, cigarettes, uncomplicated: Secondary | ICD-10-CM | POA: Diagnosis not present

## 2022-04-18 DIAGNOSIS — M545 Low back pain, unspecified: Secondary | ICD-10-CM | POA: Diagnosis not present

## 2022-04-18 DIAGNOSIS — Z6822 Body mass index (BMI) 22.0-22.9, adult: Secondary | ICD-10-CM | POA: Diagnosis not present

## 2022-04-18 DIAGNOSIS — Z79899 Other long term (current) drug therapy: Secondary | ICD-10-CM | POA: Diagnosis not present

## 2022-04-27 ENCOUNTER — Other Ambulatory Visit: Payer: Self-pay

## 2022-04-27 ENCOUNTER — Other Ambulatory Visit (HOSPITAL_BASED_OUTPATIENT_CLINIC_OR_DEPARTMENT_OTHER): Payer: Self-pay

## 2022-04-27 MED ORDER — OXYCODONE HCL 20 MG PO TABS
20.0000 mg | ORAL_TABLET | ORAL | 0 refills | Status: DC
  Filled 2022-04-27: qty 150, 30d supply, fill #0

## 2022-04-28 ENCOUNTER — Other Ambulatory Visit: Payer: Self-pay

## 2022-05-11 ENCOUNTER — Ambulatory Visit: Payer: Medicare Other | Admitting: Internal Medicine

## 2022-05-11 ENCOUNTER — Encounter: Payer: Self-pay | Admitting: Internal Medicine

## 2022-05-11 VITALS — BP 128/80 | HR 82 | Temp 98.3°F | Ht 71.0 in | Wt 177.4 lb

## 2022-05-11 DIAGNOSIS — J449 Chronic obstructive pulmonary disease, unspecified: Secondary | ICD-10-CM

## 2022-05-11 DIAGNOSIS — F17210 Nicotine dependence, cigarettes, uncomplicated: Secondary | ICD-10-CM

## 2022-05-11 MED ORDER — TRELEGY ELLIPTA 100-62.5-25 MCG/ACT IN AEPB: INHALATION_SPRAY | RESPIRATORY_TRACT | 11 refills | Status: AC

## 2022-05-11 NOTE — Patient Instructions (Addendum)
Plan A = Automatic = Always=    BEVESPI  2 puffs every 12 hours (tends to cause less cough) or  Trelegy 421 one click each am   Plan B = Backup (to supplement plan A, not to replace it) Only use your albuterol inhaler as a rescue medication to be used if you can't catch your breath by resting or doing a relaxed purse lip breathing pattern.  - The less you use it, the better it will work when you need it. - Ok to use the inhaler up to 2 puffs  every 4 hours if you must but call for appointment if use goes up over your usual need - Don't leave home without it !!  (think of it like the spare tire for your car)     For cough  >>>  mucinex dm 1200 mg every 12 hours as needed   My office will be contacting you by phone for referral to lung cancer screening program  (CT at [facility])  - if you don't hear back from my office within one week please call us back or notify us thru MyChart and we'll address it right away.    Please schedule a follow up visit in 3 months but call sooner if needed

## 2022-05-11 NOTE — Progress Notes (Signed)
Joshua [name], male    DOB: January 20, 1951    MRN: [id number]   Brief patient profile:  23  yowm  active smoker/MM  self referred to pulmonary clinic in [place]  03/09/2022 for doe/cough     History of Present Illness  03/09/2022  Pulmonary/ 1st office eval/ [name] / [facility]  trelegy 200 prn  Chief Complaint  Patient presents with   Consult    Hx of pneumonia. Cough, SOB.   Dyspnea:  denies limitation and not using any maint meds at this point Apparently no cough now nor cp but keeps talking about past symptoms he wants checked out  eg pain on R chest  > ER 07/18/21 for minimal scarring R  minor fissure  No further hx available from pt even with sister's help  Rec Plan A = Automatic = Always=   Trelegy 774 one click first thing each AM (not as needed)  Work on inhaler technique:   Plan B = Backup (to supplement plan A, not to replace it) Only use your albuterol inhaler as a rescue medication  For cough > mucinex dm 1200 mg every 12 hours as needed  If cough continues rec Try prilosec otc '20mg'$   Take 30-60 min before first meal of the day and Pepcid ac (famotidine) 20 mg one @  bedtime until cough is completely gone for at least a week without the need for cough suppression The key is to stop smoking completely before smoking completely stops you!  Cxr nad    Please schedule a follow up office visit in 4 weeks, sooner if needed  with all medications /inhalers/ solutions in hand      04/11/2022  f/u ov/Kattie Santoyo re: COPD gold 3  maint on trelegy 100 with freq exac  and recurrent "pna"  on Zpak since 04/06/21 finishing today / did not bring meds  Chief Complaint  Patient presents with   Follow-up    Pneumonia recurrent 04/06/2022.  Dyspnea:  not limited  Cough: lots of cough x ? X 2 months Sleeping: not long  SABA use: once a day  02: none  Covid status:  orinal only Lung cancer screening :  referred today  Rec Plan A = Automatic = Always=   bevespi Take 2 puffs first thing in  am and then another 2 puffs about 12 hours later.   Work on inhaler technique:  Plan B = Backup (to supplement plan A, not to replace it) Only use your albuterol inhaler as a rescue medication  For cough > mucinex dm 1200 mg every 12 hours as needed  Prilosec otc '20mg'$   Take 30-60 min before first meal of the day and Pepcid ac (famotidine) 20 mg one @  bedtime until cough is completely gone for at least a week without the need for cough suppression Prednisone 10 mg take  4 each am x 2 days,   2 each am x 2 days,  1 each am x 2 days and stop  The key is to stop smoking completely before smoking completely stops you! Please schedule a follow up office visit in 4 weeks, sooner if needed  with all medications /inhalers/ solutions in hand     05/11/2022  f/u ov/Kaycee Mcgaugh re: GOLD 3 COPD  GOLD 3 not following recs for bevespi  Chief Complaint  Patient presents with   Follow-up    Breathing is about the same since last ov wants to discuss changing inhaler    Dyspnea:  no change activity tol Cough: none  Sleeping: cough/ breathing not keeping in up SABA use: confused how/ when to use vs bevespi since they are both inhalers  02: none   Lung cancer screening :  referred .05/11/2022     No obvious day to day or daytime variability or assoc excess/ purulent sputum or mucus plugs or hemoptysis or cp or chest tightness, subjective wheeze or overt sinus or hb symptoms.   sleeping without nocturnal  or early am exacerbation  of respiratory  c/o's or need for noct saba. Also denies any obvious fluctuation of symptoms with weather or environmental changes or other aggravating or alleviating factors except as outlined above   No unusual exposure hx or h/o childhood pna/ asthma or knowledge of premature birth.  Current Allergies, Complete Past Medical History, Past Surgical History, Family History, and Social History were reviewed in [employer] record.  ROS  The following are not  active complaints unless bolded Hoarseness, sore throat, dysphagia, dental problems, itching, sneezing,  nasal congestion or discharge of excess mucus or purulent secretions, ear ache,   fever, chills, sweats, unintended wt loss or wt gain, classically pleuritic or exertional cp,  orthopnea pnd or arm/hand swelling  or leg swelling, presyncope, palpitations, abdominal pain, anorexia, nausea, vomiting, diarrhea  or change in bowel habits or change in bladder habits, change in stools or change in urine, dysuria, hematuria,  rash, arthralgias, visual complaints, headache, numbness, weakness or ataxia or problems with walking or coordination,  change in mood or  memory.        Current Meds  Medication Sig   albuterol (PROVENTIL HFA;VENTOLIN HFA) 108 (90 BASE) MCG/ACT inhaler Inhale 2 puffs into the lungs every 6 (six) hours as needed for wheezing or shortness of breath.    dextromethorphan-guaiFENesin (MUCINEX DM) 30-600 MG 12hr tablet Take 1 tablet by mouth 2 (two) times daily.   diazepam (VALIUM) 10 MG tablet Take 10 mg by mouth at bedtime.   Glycopyrrolate-Formoterol (BEVESPI AEROSPHERE) 9-4.8 MCG/ACT AERO Inhale 2 puffs into the lungs 2 (two) times daily.   HYDROcodone-acetaminophen (NORCO) 10-325 MG tablet Take 1 tablet by mouth every 6 (six) hours as needed.   Oxycodone HCl 20 MG TABS Take 1 tablet by mouth 5 times daily as needed.   oxymetazoline (AFRIN) 0.05 % nasal spray Place 1 spray into both nostrils 2 (two) times daily.              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    Chronic pain    COPD (chronic obstructive pulmonary disease) (HCC)    Meniere's disease    PONV (postoperative nausea and vomiting)    Stroke (HCC)    30 years ago        Objective:    Wts   05/11/2022     177  04/11/22 179 lb 9.6 oz (81.5 kg)  03/09/22 183 lb 1.6 oz (83.1 kg)  11/26/18 159 lb 13.3 oz (72.5 kg)    Vital signs reviewed   05/11/2022  - Note at rest 02 sats  97% on RA   General appearance:    amb wm, extremely hoh   HEENT : Oropharynx  clear     NECK :  without  apparent JVD/ palpable Nodes/TM    LUNGS: no acc muscle use,  Mild barrel  contour chest wall with bilateral  Distant bs s  audible wheeze and  without cough on insp or exp maneuvers  and mild  Hyperresonant  to  percussion bilaterally     CV:  RRR  no s3 or murmur or increase in P2, and no edema   ABD:  soft and nontender with pos end  insp Hoover's  in the supine position.  No bruits or organomegaly appreciated   MS:  Nl gait/ ext warm without deformities Or obvious joint restrictions  calf tenderness, cyanosis or clubbing     SKIN: warm and dry without lesions    NEURO:  alert, approp, nl sensorium with  no motor or cerebellar deficits apparent.               Assessment

## 2022-05-11 NOTE — Assessment & Plan Note (Signed)
Active smoker?MM  - 03/09/2022   Walked on RA   x  3  lap(s) =  approx 450  ft  @ mod pace, stopped due to end of study  with lowest 02 sats 92% and no sob   - 04/11/2022 d/c trelegy and start bevespi due to recurrent "pna " - Spirometry 04/11/2022  FEV1 1/48 (44%)  Ratio 0.48 prior to study with classic concavity  -  04/11/2022  :     alpha one AT phenotype  MM level 197/  E0S 0    - 05/11/2022  After extensive coaching inhaler device,  effectiveness =    90% with dpi > change to trelegy 100 each am now that cough is not his primary concern   Group D (now reclassified as E) in terms of symptom/risk and laba/lama/ICS  therefore appropriate rx at this point >>>  trelegy 631 one click each am and approp saba  Re SABA :  I spent extra time with pt today reviewing appropriate use of albuterol for prn use on exertion with the following points: 1) saba is for relief of sob that does not improve by walking a slower pace or resting but rather if the pt does not improve after trying this first. 2) If the pt is convinced, as many are, that saba helps recover from activity faster then it's easy to tell if this is the case by re-challenging : ie stop, take the inhaler, then p 5 minutes try the exact same activity (intensity of workload) that just caused the symptoms and see if they are substantially diminished or not after saba 3) if there is an activity that reproducibly causes the symptoms, try the saba 15 min before the activity on alternate days   If in fact the saba really does help, then fine to continue to use it prn but advised may need to look closer at the maintenance regimen being used to achieve better control of airways disease with exertion.

## 2022-05-11 NOTE — Assessment & Plan Note (Addendum)
Counseled re importance of smoking cessation but did not meet time criteria for separate billing    Low-dose CT lung cancer screening is recommended for patients who are 15-70 years of age with a 20+ pack-year history of smoking and who are currently smoking or quit <=15 years ago. No coughing up blood  No unintentional weight loss of > 15 pounds in the last 6 months - pt is eligible for scanning yearly until age 1 > referred           Each maintenance medication was reviewed in detail including emphasizing most importantly the difference between maintenance and prns and under what circumstances the prns are to be triggered using an action plan format where appropriate.  Total time for H and P, chart review, counseling, reviewing hfa/dpi  device(s) and generating customized AVS unique to this office visit / same day charting = 33 min

## 2022-05-15 DIAGNOSIS — Z79899 Other long term (current) drug therapy: Secondary | ICD-10-CM | POA: Diagnosis not present

## 2022-05-15 DIAGNOSIS — G8929 Other chronic pain: Secondary | ICD-10-CM | POA: Diagnosis not present

## 2022-05-15 DIAGNOSIS — M545 Low back pain, unspecified: Secondary | ICD-10-CM | POA: Diagnosis not present

## 2022-05-19 ENCOUNTER — Other Ambulatory Visit: Payer: Self-pay

## 2022-05-19 DIAGNOSIS — Z87891 Personal history of nicotine dependence: Secondary | ICD-10-CM

## 2022-05-19 DIAGNOSIS — Z122 Encounter for screening for malignant neoplasm of respiratory organs: Secondary | ICD-10-CM

## 2022-06-12 DIAGNOSIS — C3490 Malignant neoplasm of unspecified part of unspecified bronchus or lung: Secondary | ICD-10-CM

## 2022-06-12 HISTORY — DX: Malignant neoplasm of unspecified part of unspecified bronchus or lung: C34.90

## 2022-06-20 DIAGNOSIS — M545 Low back pain, unspecified: Secondary | ICD-10-CM | POA: Diagnosis not present

## 2022-06-20 DIAGNOSIS — G8929 Other chronic pain: Secondary | ICD-10-CM | POA: Diagnosis not present

## 2022-06-20 DIAGNOSIS — Z79899 Other long term (current) drug therapy: Secondary | ICD-10-CM | POA: Diagnosis not present

## 2022-06-23 DIAGNOSIS — E039 Hypothyroidism, unspecified: Secondary | ICD-10-CM | POA: Diagnosis not present

## 2022-06-23 DIAGNOSIS — E559 Vitamin D deficiency, unspecified: Secondary | ICD-10-CM | POA: Diagnosis not present

## 2022-06-23 DIAGNOSIS — Z0001 Encounter for general adult medical examination with abnormal findings: Secondary | ICD-10-CM | POA: Diagnosis not present

## 2022-06-23 DIAGNOSIS — D518 Other vitamin B12 deficiency anemias: Secondary | ICD-10-CM | POA: Diagnosis not present

## 2022-06-23 DIAGNOSIS — J449 Chronic obstructive pulmonary disease, unspecified: Secondary | ICD-10-CM | POA: Diagnosis not present

## 2022-06-23 DIAGNOSIS — M1991 Primary osteoarthritis, unspecified site: Secondary | ICD-10-CM | POA: Diagnosis not present

## 2022-06-23 DIAGNOSIS — M549 Dorsalgia, unspecified: Secondary | ICD-10-CM | POA: Diagnosis not present

## 2022-06-23 DIAGNOSIS — C679 Malignant neoplasm of bladder, unspecified: Secondary | ICD-10-CM | POA: Diagnosis not present

## 2022-06-29 ENCOUNTER — Encounter: Payer: Self-pay | Admitting: Primary Care

## 2022-06-29 ENCOUNTER — Ambulatory Visit (INDEPENDENT_AMBULATORY_CARE_PROVIDER_SITE_OTHER): Payer: Medicare Other | Admitting: Primary Care

## 2022-06-29 DIAGNOSIS — Z87891 Personal history of nicotine dependence: Secondary | ICD-10-CM | POA: Diagnosis not present

## 2022-06-29 DIAGNOSIS — Z720 Tobacco use: Secondary | ICD-10-CM | POA: Diagnosis not present

## 2022-06-29 NOTE — Progress Notes (Signed)
Virtual Visit via Telephone Note  I connected with Joshua [name] on 06/29/22 at 11:00 AM EST by telephone and verified that I am speaking with the correct person using two identifiers.  Location: Patient: Home/ Provider: Office   I discussed the limitations, risks, security and privacy concerns of performing an evaluation and management service by telephone and the availability of in person appointments. I also discussed with the patient that there may be a patient responsible charge related to this service. The patient expressed understanding and agreed to proceed.   Shared Decision Making Visit Lung Cancer Screening Program [phone])   Eligibility: Age 72 y.o. Pack Years Smoking History Calculation 27 (# packs/per year x # years smoked) Recent History of coughing up blood  no Unexplained weight loss? no ( >Than 15 pounds within the last 6 months ) Prior History Lung / other cancer no (Diagnosis within the last 5 years already requiring surveillance chest CT Scans). Smoking Status Former Smoker Former Smokers: Years since quit: < 1 year  Quit Date: January 2024- quit 2 weeks ago   Visit Components: Discussion included one or more decision making aids. yes Discussion included risk/benefits of screening. yes Discussion included potential follow up diagnostic testing for abnormal scans. yes Discussion included meaning and risk of over diagnosis. yes Discussion included meaning and risk of False Positives. yes Discussion included meaning of total radiation exposure. yes  Counseling Included: Importance of adherence to annual lung cancer LDCT screening. yes Impact of comorbidities on ability to participate in the program. yes Ability and willingness to under diagnostic treatment. yes  Smoking Cessation Counseling: Current Smokers:  Discussed importance of smoking cessation. yes Information about tobacco cessation classes and interventions provided to patient. yes Patient  provided with "ticket" for LDCT Scan. NA Symptomatic Patient. no  Counseling(Intermediate counseling: > three minutes) 99406 Diagnosis Code: Tobacco Use Z72.0 Asymptomatic Patient yes  Counseling (Intermediate counseling: > three minutes counseling) M0947 Former Smokers:  Discussed the importance of maintaining cigarette abstinence. yes Diagnosis Code: Personal History of Nicotine Dependence. S96.283 Information about tobacco cessation classes and interventions provided to patient. Yes Patient provided with "ticket" for LDCT Scan. NA Written Order for Lung Cancer Screening with LDCT placed in Epic. Yes (CT Chest Lung Cancer Screening Low Dose W/O CM) [id number] Z12.2-Screening of respiratory organs Z87.891-Personal history of nicotine dependence  I have spent 25 minutes of face to face/ virtual visit time with Joshua [name]/ Joshua [name] discussing the risks and benefits of lung cancer screening. We viewed / discussed a power point together that explained in detail the above noted topics. We paused at intervals to allow for questions to be asked and answered to ensure understanding.We discussed that the single most powerful action that he can take to decrease his risk of developing lung cancer is to quit smoking. We discussed whether or not he is ready to commit to setting a quit date. We discussed options for tools to aid in quitting smoking including nicotine replacement therapy, non-nicotine medications, support groups, Quit Smart classes, and behavior modification. We discussed that often times setting smaller, more achievable goals, such as eliminating 1 cigarette a day for a week and then 2 cigarettes a day for a week can be helpful in slowly decreasing the number of cigarettes smoked. This allows for a sense of accomplishment as well as providing a clinical benefit. I provided  him with smoking cessation  information  with contact information for community resources, classes, free nicotine replacement  therapy, and access  to mobile apps, text messaging, and on-line smoking cessation help. I have also provided  him  the office contact information in the event he needs to contact me, or the screening staff. We discussed the time and location of the scan, and that either Doroteo Glassman RN, Joella Prince, RN  or I will call / send a letter with the results within 24-72 hours of receiving them. The patient verbalized understanding of all of  the above and had no further questions upon leaving the office. They have my contact information in the event they have any further questions.  I spent 3-5 minutes counseling on smoking cessation and the health risks of continued tobacco abuse.  I explained to the patient that there has been a high incidence of coronary artery disease noted on these exams. I explained that this is a non-gated exam therefore degree or severity cannot be determined. This patient is not on statin therapy. I have asked the patient to follow-up with their PCP regarding any incidental finding of coronary artery disease and management with diet or medication as their PCP  feels is clinically indicated. The patient verbalized understanding of the above and had no further questions upon completion of the visit.   Martyn Ehrich, NP

## 2022-06-29 NOTE — Patient Instructions (Signed)
Thank you for participating in the [facility]. It was our pleasure to meet you today. We will call you with the results of your scan within the next few days. Your scan will be assigned a Lung RADS category score by the physicians reading the scans.  This Lung RADS score determines follow up scanning.  See below for description of categories, and follow up screening recommendations. We will be in touch to schedule your follow up screening annually or based on recommendations of our providers. We will fax a copy of your scan results to your Primary Care Physician, or the physician who referred you to the program, to ensure they have the results. Please call the office if you have any questions or concerns regarding your scanning experience or results.  Our office number is [phone]. Please speak with Denise Phelps, RN. , or  Denise Buckner RN, They are  our Lung Cancer Screening RN.'s If They are unavailable when you call, Please leave a message on the voice mail. We will return your call at our earliest convenience.This voice mail is monitored several times a day.  Remember, if your scan is normal, we will scan you annually as long as you continue to meet the criteria for the program. (Age 50-80, Current smoker or smoker who has quit within the last 15 years). If you are a smoker, remember, quitting is the single most powerful action that you can take to decrease your risk of lung cancer and other pulmonary, breathing related problems. We know quitting is hard, and we are here to help.  Please let us know if there is anything we can do to help you meet your goal of quitting. If you are a former smoker, congratulations. We are proud of you! Remain smoke free! Remember you can refer friends or family members through the number above.  We will screen them to make sure they meet criteria for the program. Thank you for helping us take better care of you by  participating in Lung Screening.  You can receive free nicotine replacement therapy ( patches, gum or mints) by calling 1-800-QUIT NOW. Please call so we can get you on the path to becoming  a non-smoker. I know it is hard, but you can do this!  Lung RADS Categories:  Lung RADS 1: no nodules or definitely non-concerning nodules.  Recommendation is for a repeat annual scan in 12 months.  Lung RADS 2:  nodules that are non-concerning in appearance and behavior with a very low likelihood of becoming an active cancer. Recommendation is for a repeat annual scan in 12 months.  Lung RADS 3: nodules that are probably non-concerning , includes nodules with a low likelihood of becoming an active cancer.  Recommendation is for a 6-month repeat screening scan. Often noted after an upper respiratory illness. We will be in touch to make sure you have no questions, and to schedule your 6-month scan.  Lung RADS 4 A: nodules with concerning findings, recommendation is most often for a follow up scan in 3 months or additional testing based on our provider's assessment of the scan. We will be in touch to make sure you have no questions and to schedule the recommended 3 month follow up scan.  Lung RADS 4 B:  indicates findings that are concerning. We will be in touch with you to schedule additional diagnostic testing based on our provider's  assessment of the scan.  Other options for assistance in smoking cessation (   As covered by your insurance benefits)  Hypnosis for smoking cessation  [employer] [phone]  Acupuncture for smoking cessation  [facility] [phone]   

## 2022-06-30 ENCOUNTER — Telehealth: Payer: Self-pay | Admitting: Acute Care

## 2022-06-30 ENCOUNTER — Ambulatory Visit (HOSPITAL_COMMUNITY)
Admission: RE | Admit: 2022-06-30 | Discharge: 2022-06-30 | Disposition: A | Discharge status: Home / Self Care | Payer: Medicare Other | Source: Ambulatory Visit | Attending: Acute Care | Admitting: Acute Care

## 2022-06-30 DIAGNOSIS — F17210 Nicotine dependence, cigarettes, uncomplicated: Secondary | ICD-10-CM | POA: Diagnosis not present

## 2022-06-30 DIAGNOSIS — Z87891 Personal history of nicotine dependence: Secondary | ICD-10-CM | POA: Diagnosis not present

## 2022-06-30 DIAGNOSIS — Z122 Encounter for screening for malignant neoplasm of respiratory organs: Secondary | ICD-10-CM | POA: Insufficient documentation

## 2022-06-30 DIAGNOSIS — R911 Solitary pulmonary nodule: Secondary | ICD-10-CM

## 2022-06-30 NOTE — Telephone Encounter (Signed)
Call report on LDCT done 06/30/22   Impression:  IMPRESSION: 1. 7.0 mm left upper lobe nodule. Lung-RADS 3, probably benign findings. Short-term follow-up in 6 months is recommended with repeat low-dose chest CT without contrast (please use the following order, "CT CHEST LCS NODULE FOLLOW-UP W/O CM"). These results will be called to the ordering clinician or representative by the Radiologist Assistant, and communication documented in the PACS or [employer]. 2. 4.1 cm ascending aortic aneurysm, stable. Recommend annual imaging followup by CTA or MRA. This recommendation follows 2010 ACCF/AHA/AATS/ACR/ASA/SCA/SCAI/SIR/STS/SVM Guidelines for the Diagnosis and Management of Patients with Thoracic Aortic Disease. Circulation. 2010; 121: V728-A060. Aortic aneurysm NOS (ICD10-I71.9). 3. Aortic atherosclerosis (ICD10-I70.0). Coronary artery calcification. 4.  Emphysema (ICD10-J43.9).     Electronically Signed   By: Lorin Picket M.D.   On: 06/30/2022 14:48

## 2022-07-03 NOTE — Telephone Encounter (Signed)
Called and spoke with [place], pt's mother and POA. Pt is hard of hearing. I explained that there was a small nodule seen that was new from a comparison CT from 2018 and that radiologist has recommended a 6 month follow up CT. Also advised of the noted aortic aneurysm seen. Explained importance of monitoring blood pressure and that this could continue to be measured on his yearly scans. She verbalized understanding and is aware we will call closer to 6 months to schedule follow up scan. Results faxed to Dr Gerarda Fraction with follow up plans included.

## 2022-07-18 DIAGNOSIS — Z0000 Encounter for general adult medical examination without abnormal findings: Secondary | ICD-10-CM | POA: Diagnosis not present

## 2022-07-18 DIAGNOSIS — M545 Low back pain, unspecified: Secondary | ICD-10-CM | POA: Diagnosis not present

## 2022-07-18 DIAGNOSIS — G8929 Other chronic pain: Secondary | ICD-10-CM | POA: Diagnosis not present

## 2022-08-11 ENCOUNTER — Ambulatory Visit: Payer: Medicare Other | Admitting: Internal Medicine

## 2022-08-11 ENCOUNTER — Encounter: Payer: Self-pay | Admitting: Internal Medicine

## 2022-08-11 VITALS — BP 134/68 | HR 90 | Ht 71.0 in | Wt 179.8 lb

## 2022-08-11 DIAGNOSIS — I7121 Aneurysm of the ascending aorta, without rupture: Secondary | ICD-10-CM | POA: Diagnosis not present

## 2022-08-11 DIAGNOSIS — J449 Chronic obstructive pulmonary disease, unspecified: Secondary | ICD-10-CM | POA: Diagnosis not present

## 2022-08-11 DIAGNOSIS — F17210 Nicotine dependence, cigarettes, uncomplicated: Secondary | ICD-10-CM | POA: Diagnosis not present

## 2022-08-11 NOTE — Assessment & Plan Note (Signed)
Counseled re importance of smoking cessation but did not meet time criteria for separate billing   °

## 2022-08-11 NOTE — Assessment & Plan Note (Signed)
Active smoker?MM  - 03/09/2022   Walked on RA   x  3  lap(s) =  approx 450  ft  @ mod pace, stopped due to end of study  with lowest 02 sats 92% and no sob   - 04/11/2022 d/c trelegy and start bevespi due to recurrent "pna " - Spirometry 04/11/2022  FEV1 1/48 (44%)  Ratio 0.48 prior to study with classic concavity  -  04/11/2022  :     alpha one AT phenotype  MM level 197/  E0S 0  - 05/11/2022  After extensive coaching inhaler device,  effectiveness =    90% with dpi > change to trelegy 100 each am now that cough is not his primary concern   Group D (now reclassified as E) in terms of symptom/risk and laba/lama/ICS  therefore appropriate rx at this point >>>   continue trelegy 100 and approp saba

## 2022-08-11 NOTE — Assessment & Plan Note (Signed)
See LDSCT 06/30/22 new since 2018 CT   Will need to set up annual f/u and consider adding low dose metaprolol to prevent enlargement defer to PCP whether to refer to cards as well - given the difficulty communicating with him due to hearing the simpler we keep his care the better.          Each maintenance medication was reviewed in detail including emphasizing most importantly the difference between maintenance and prns and under what circumstances the prns are to be triggered using an action plan format where appropriate.  Total time for H and P, chart review, counseling, reviewing dpi /hfa  device(s) and generating customized AVS unique to this office visit / same day charting = 33 min

## 2022-08-11 NOTE — Progress Notes (Unsigned)
Joshua [name], male    DOB: 17-Nov-1950    MRN: [id number]   Brief patient profile:  72  yowm  active smoker/MM  self referred to pulmonary clinic in [name]  03/09/2022 for doe/cough     History of Present Illness  03/09/2022  Pulmonary/ 1st [name] eval/ Joshua [name] / Joshua [name] [name]  trelegy 200 prn  Chief Complaint  Patient presents with   Consult    Hx of pneumonia. Cough, SOB.   Dyspnea:  denies limitation and not using any maint meds at this point Apparently no cough now nor cp but keeps talking about past symptoms he wants checked out  eg pain on R chest  > ER 07/18/21 for minimal scarring R  minor fissure  No further hx available from pt even with sister's help  Rec Plan A = Automatic = Always=   Trelegy [id number] one click first thing each AM (not as needed)  Work on inhaler technique:   Plan B = Backup (to supplement plan A, not to replace it) Only use your albuterol inhaler as a rescue medication  For cough > mucinex dm 1200 mg every 12 hours as needed  If cough continues rec Try prilosec otc '20mg'$   Take 30-60 min before first meal of the day and Pepcid ac (famotidine) 20 mg one @  bedtime until cough is completely gone for at least a week without the need for cough suppression The key is to stop smoking completely before smoking completely stops you!  Cxr nad    Please schedule a follow up [name] visit in 4 weeks, sooner if needed  with all medications /inhalers/ solutions in hand        05/11/2022  f/u ov/Joshua [name] re: GOLD 3 COPD  GOLD 3 not following recs for bevespi  Chief Complaint  Patient presents with   Follow-up    Breathing is about the same since last ov wants to discuss changing inhaler    Dyspnea:  no change activity tol Cough: none  Sleeping: cough/ breathing not keeping in up SABA use: confused how/ when to use vs bevespi since they are both inhalers  02: none Lung cancer screening :  referred .05/11/2022   Rec Plan A = Automatic = Always=    BEVESPI  2 puffs  every 12 hours (tends to cause less cough) or  Trelegy [id number] one click each am  Plan B = Backup (to supplement plan A, not to replace it) Only use your albuterol inhaler as a rescue medication  For cough  >>>  mucinex dm 1200 mg every 12 hours as needed       08/11/2022  f/u ov/Joshua [name]/Joshua [name] re: GOLD 3 copd  maint on trelegy  100, still smoking  Chief Complaint  Patient presents with   Follow-up    Breathing okay since last visit    Dyspnea:  not limited  from breathing / back slows him down / does treadmill x one- half mile  x 10-15 min flat grade  Cough: none  Sleeping: bed is level with one pillow / no resp  SABA use: not using  02: none  Covid status: no vax this year/ never infected Lung cancer screening: 06/30/22 see below   No obvious day to day or daytime variability or assoc excess/ purulent sputum or mucus plugs or hemoptysis or cp or chest tightness, subjective wheeze or overt sinus or hb symptoms.   Sleeping  without nocturnal  or early am exacerbation  of respiratory  c/o's or need for noct saba. Also denies any obvious fluctuation of symptoms with weather or environmental changes or other aggravating or alleviating factors except as outlined above   No unusual exposure hx or h/o childhood pna/ asthma or knowledge of premature birth.  Current Allergies, Complete Past Medical History, Past Surgical History, Family History, and Social History were reviewed in [employer] record.  ROS  The following are not active complaints unless bolded Hoarseness, sore throat, dysphagia, dental problems, itching, sneezing,  nasal congestion or discharge of excess mucus or purulent secretions, ear ache,   fever, chills, sweats, unintended wt loss or wt gain, classically pleuritic or exertional cp,  orthopnea pnd or arm/hand swelling  or leg swelling, presyncope, palpitations, abdominal pain, anorexia, nausea, vomiting, diarrhea  or change in bowel habits or  change in bladder habits, change in stools or change in urine, dysuria, hematuria,  rash, arthralgias/chronic back pain , visual complaints, headache, numbness, weakness or ataxia or problems with walking or coordination,  change in mood or  memory.  Poor hearing         Current Meds  Medication Sig   albuterol (PROVENTIL HFA;VENTOLIN HFA) 108 (90 BASE) MCG/ACT inhaler Inhale 2 puffs into the lungs every 6 (six) hours as needed for wheezing or shortness of breath.    dextromethorphan-guaiFENesin (MUCINEX DM) 30-600 MG 12hr tablet Take 1 tablet by mouth 2 (two) times daily.   diazepam (VALIUM) 10 MG tablet Take 10 mg by mouth at bedtime.   Fluticasone-Umeclidin-Vilant (TRELEGY ELLIPTA) 100-62.5-25 MCG/ACT AEPB One click each am        HYDROcodone-acetaminophen (NORCO) 10-325 MG tablet Take 1 tablet by mouth every 6 (six) hours as needed.   Oxycodone HCl 20 MG TABS Take 1 tablet by mouth 5 times daily as needed.   oxymetazoline (AFRIN) 0.05 % nasal spray Place 1 spray into both nostrils 2 (two) times daily.                 Past Medical History:  Diagnosis Date   Back pain    Cancer ([facility])    bladder in the past    Chronic pain    COPD (chronic obstructive pulmonary disease) (HCC)    Meniere's disease    PONV (postoperative nausea and vomiting)    Stroke (HCC)    30 years ago        Objective:    Wts  08/11/2022         179  05/11/2022     177  04/11/22 179 lb 9.6 oz (81.5 kg)  03/09/22 183 lb 1.6 oz (83.1 kg)  11/26/18 159 lb 13.3 oz (72.5 kg)   Vital signs reviewed  08/11/2022  - Note at rest 02 sats  95% on RA   General appearance:    amb wm, very hard of hearing    HEENT : Oropharynx  clear / edentulous      NECK :  without  apparent JVD/ palpable Nodes/TM    LUNGS: no acc muscle use,  Mild barrel  contour chest wall with bilateral  Distant bs s audible wheeze and  without cough on insp or exp maneuvers  and mild  Hyperresonant  to  percussion bilaterally      CV:  RRR  no s3 or murmur or increase in P2, and no edema   ABD:  soft and nontender with pos end  insp Hoover's  in the supine position.  No bruits or organomegaly appreciated  MS:  Nl gait/ ext warm without deformities Or obvious joint restrictions  calf tenderness, cyanosis or clubbing     SKIN: warm and dry without lesions    NEURO:  alert, approp, nl sensorium with  no motor or cerebellar deficits apparent.               I personally reviewed images and agree with radiology impression as follows:   Chest LDSCT     06/30/22 . 7.0 mm left upper lobe nodule. Lung-RADS 3, probably benign findings. Short-term follow-up in 6 months is recommended   2. 4.1 cm ascending aortic aneurysm, stable. Recommend annual imaging followup by CTA or MRA.  3. Aortic atherosclerosis (ICD10-I70.0). Coronary artery calcification. 4.  Emphysema (ICD10-J43.9).         Assessment

## 2022-08-11 NOTE — Patient Instructions (Signed)
No change in medications   Please schedule a follow up visit in 12  months but call sooner if needed  

## 2022-08-12 ENCOUNTER — Encounter: Payer: Self-pay | Admitting: Internal Medicine

## 2022-08-15 DIAGNOSIS — M545 Low back pain, unspecified: Secondary | ICD-10-CM | POA: Diagnosis not present

## 2022-08-15 DIAGNOSIS — G8929 Other chronic pain: Secondary | ICD-10-CM | POA: Diagnosis not present

## 2022-08-15 DIAGNOSIS — Z79899 Other long term (current) drug therapy: Secondary | ICD-10-CM | POA: Diagnosis not present

## 2022-08-15 DIAGNOSIS — R030 Elevated blood-pressure reading, without diagnosis of hypertension: Secondary | ICD-10-CM | POA: Diagnosis not present

## 2022-08-16 ENCOUNTER — Telehealth: Payer: Self-pay

## 2022-08-16 DIAGNOSIS — I7121 Aneurysm of the ascending aorta, without rupture: Secondary | ICD-10-CM

## 2022-08-16 DIAGNOSIS — M1991 Primary osteoarthritis, unspecified site: Secondary | ICD-10-CM | POA: Diagnosis not present

## 2022-08-16 DIAGNOSIS — C679 Malignant neoplasm of bladder, unspecified: Secondary | ICD-10-CM | POA: Diagnosis not present

## 2022-08-16 DIAGNOSIS — J449 Chronic obstructive pulmonary disease, unspecified: Secondary | ICD-10-CM | POA: Diagnosis not present

## 2022-08-16 NOTE — Telephone Encounter (Signed)
Dr. [name] can you verify that pending order is correct?

## 2022-08-16 NOTE — Telephone Encounter (Signed)
-----   Message from Tanda Rockers, MD sent at 08/16/2022  4:18 PM EST ----- Will need referral to T surgery for Thoracic aneurysm and lung nodules for annual f/u so no rush - could put it in for 1st of year 2025  ----- Message ----- From: Magdalen Spatz, NP Sent: 08/16/2022   3:30 PM EST To: Tanda Rockers, MD  It is fine for you to refer.  ----- Message ----- From: Tanda Rockers, MD Sent: 08/11/2022  11:24 AM EST To: Magdalen Spatz, NP  It looks like he'll need annual CTa for TA so it would make more sense to refer him to T surgery (cards wouldn't be able to handle any nodules s referring whereas T surgery could take care of it just fine)    Should I refer to [facility] or do you have some other way of handling this type of pt?

## 2022-08-16 NOTE — Telephone Encounter (Signed)
Looks good

## 2022-08-28 ENCOUNTER — Telehealth: Payer: Self-pay | Admitting: Internal Medicine

## 2022-08-28 NOTE — Telephone Encounter (Signed)
Pt sister wants to know about his appt scheduled for March 25th. Please inform

## 2022-08-28 NOTE — Telephone Encounter (Signed)
Attempted to call pt but unable to reach. Left message to return call.  

## 2022-09-04 ENCOUNTER — Encounter: Payer: Self-pay | Admitting: Physician Assistant

## 2022-09-04 ENCOUNTER — Institutional Professional Consult (permissible substitution): Payer: Medicare Other | Admitting: Physician Assistant

## 2022-09-04 VITALS — BP 122/76 | HR 78 | Resp 20 | Ht 71.0 in | Wt 179.0 lb

## 2022-09-04 DIAGNOSIS — R911 Solitary pulmonary nodule: Secondary | ICD-10-CM | POA: Diagnosis not present

## 2022-09-04 DIAGNOSIS — I7121 Aneurysm of the ascending aorta, without rupture: Secondary | ICD-10-CM | POA: Diagnosis not present

## 2022-09-04 NOTE — Progress Notes (Signed)
PCP is Redmond School, MD Referring Provider is Tanda Rockers, MD  Reason for referral: 4.1cm thoracic aortic aneurysm  and 4mm left upper lobe pulmonary nodule.   HPI: 72 year old male with a past history notable for COPD with ongoing tobacco use, chronic low back pain, history of multiple skin cancers, bladder cancer, and who is significantly hard of hearing.   Mr. Joshua [name] recently underwent low-dose screening CT scan of the chest to rule out lung cancer.  He was noted to have a 7 mm nodule in the left upper lobe as well as a 4.1 cm dilation of the ascending aorta.  He was referred to cardiothoracic surgery for surveillance of these lesions.   Mr. Joshua [name] denies any chest pain, cough, hemoptysis, or weight loss.  He continues to do strenuous work on a regular basis despite his chronic back pain.  Mr. Joshua [name] is accompanied today by his sister and daughter who is an Therapist, sports.   Past Medical History:  Diagnosis Date   Back pain    Cancer ([place])    bladder in the past    Chronic pain    COPD (chronic obstructive pulmonary disease) (HCC)    Meniere's disease    PONV (postoperative nausea and vomiting)    Stroke ([place])    30 years ago    Past Surgical History:  Procedure Laterality Date   BACK SURGERY     BLADDER SURGERY     hard of hearing     HYDROCELE EXCISION Right 11/26/2018   Procedure: RIGHT HYDROCELECTOMY;  Surgeon: Franchot Gallo, MD;  Location: [facility];  Service: Urology;  Laterality: Right;   NASAL SINUS SURGERY      No family history on file.  Social History Social History   Tobacco Use   Smoking status: Some Days    Packs/day: 1    Types: Cigarettes   Smokeless tobacco: Current  Substance Use Topics   Alcohol use: No   Drug use: Never    Current Outpatient Medications  Medication Sig Dispense Refill   albuterol (PROVENTIL HFA;VENTOLIN HFA) 108 (90 BASE) MCG/ACT inhaler Inhale 2 puffs into the lungs every 6 (six) hours as needed for wheezing or shortness of  breath.      dextromethorphan-guaiFENesin (MUCINEX DM) 30-600 MG 12hr tablet Take 1 tablet by mouth 2 (two) times daily.     diazepam (VALIUM) 10 MG tablet Take 10 mg by mouth at bedtime.     Fluticasone-Umeclidin-Vilant (TRELEGY ELLIPTA) 100-62.5-25 MCG/ACT AEPB One click each am 28 each 11   HYDROcodone-acetaminophen (NORCO) 10-325 MG tablet Take 1 tablet by mouth every 6 (six) hours as needed.     Oxycodone HCl 20 MG TABS Take 1 tablet by mouth 5 times daily as needed. 150 tablet 0   oxymetazoline (AFRIN) 0.05 % nasal spray Place 1 spray into both nostrils 2 (two) times daily.     No current facility-administered medications for this visit.    No Known Allergies  Review of Systems: Review of Systems  Constitutional: Negative.   HENT:  Positive for hearing loss.   Eyes: Negative.   Respiratory:  Positive for shortness of breath.   Cardiovascular: Negative.   Gastrointestinal: Negative.   Genitourinary: Negative.   Musculoskeletal:  Positive for back pain.  Skin: Negative.   Neurological: Negative.   Endo/Heme/Allergies: Negative.   Psychiatric/Behavioral: Negative.       Physical Exam: Vital signs BP 122/76 Pulse 78 Respirations 20 SpO2 93% on room air  General: Very pleasant but  hard of hearing 72 year old gentleman who appears his stated age.  He is in no distress Neck: No JVD, no carotid bruit. Heart: Regular rate and rhythm, no murmur Chest: Breath sounds are full, equal, and clear to auscultation. Extremities: Palpable peripheral pulses.  No peripheral edema, no obvious deformity Neuro: Grossly intact   Diagnostic Tests: CLINICAL DATA:  Current smoker, 30 pack-year history.   EXAM: CT CHEST WITHOUT CONTRAST LOW-DOSE FOR LUNG CANCER SCREENING   TECHNIQUE: Multidetector CT imaging of the chest was performed following the standard protocol without IV contrast.   RADIATION DOSE REDUCTION: This exam was performed according to the departmental  dose-optimization program which includes automated exposure control, adjustment of the mA and/or kV according to patient size and/or use of iterative reconstruction technique.   COMPARISON:  08/02/2016.   FINDINGS: Cardiovascular: Ascending aorta measures 4.1 cm (5/93), stable. Atherosclerotic calcification of the aorta, aortic valve and coronary arteries. Heart size normal. No pericardial effusion.   Mediastinum/Nodes: No pathologically enlarged mediastinal or axillary lymph nodes. Hilar regions are difficult to definitively evaluate without IV contrast. Esophagus is somewhat air-filled, suggesting dysmotility.   Lungs/Pleura: Bullous centrilobular and paraseptal emphysema. Biapical pleuroparenchymal scarring. 7.0 mm apical left upper lobe nodule (4/98). Additional pulmonary nodules measure 4.4 mm or less in size. No pleural fluid. Airway is unremarkable.   Upper Abdomen: Subcentimeter low-attenuation lesion in the dome of the right hepatic lobe, unchanged and indicative of a cyst. Visualized portions of the liver, gallbladder, adrenal glands and right kidney are otherwise unremarkable. Atrophic left kidney. Visualized portions of the spleen, pancreas, stomach and bowel are grossly unremarkable. No upper abdominal adenopathy.   Musculoskeletal: Degenerative changes in the spine. Spinal stimulator wires. No worrisome lytic or sclerotic lesions.   IMPRESSION: 1. 7.0 mm left upper lobe nodule. Lung-RADS 3, probably benign findings. Short-term follow-up in 6 months is recommended with repeat low-dose chest CT without contrast (please use the following order, "CT CHEST LCS NODULE FOLLOW-UP W/O CM"). These results will be called to the ordering clinician or representative by the Radiologist Assistant, and communication documented in the PACS or [employer]. 2. 4.1 cm ascending aortic aneurysm, stable. Recommend annual imaging followup by CTA or MRA. This recommendation follows  2010 ACCF/AHA/AATS/ACR/ASA/SCA/SCAI/SIR/STS/SVM Guidelines for the Diagnosis and Management of Patients with Thoracic Aortic Disease. Circulation. 2010; [id number]. Aortic aneurysm NOS (ICD10-I71.9). 3. Aortic atherosclerosis (ICD10-I70.0). Coronary artery calcification. 4.  Emphysema (ICD10-J43.9).     Electronically Signed   By: Lorin Picket M.D.   On: 06/30/2022 14:48  Impression / Plan: Pleasant 72 year old male referred with a 7 mm left upper lobe pulmonary nodule on screening CT scan.  He was also incidentally noted to have 4.1 cm thoracic aortic aneurysm.  We discussed the rationale for surveillance, the importance of blood pressure management, and the importance of avoiding repetitive strenuous activity to minimize the risk of the thoracic aneurysm.  Recommend follow-up CT scan in 6 months to follow-up on the left upper lobe pulmonary nodule as per the radiologist recommendation.  If the pulmonary lesion continues to appear benign radiographically, we may be able to extend the surveillance interval of both the pulmonary lesion and thoracic aneurysm to 1 year.   Antony Odea, PA-C [facility] [phone]

## 2022-09-04 NOTE — Patient Instructions (Addendum)
Avoid strenuous activity with lifting no more than 40 pounds  Close watch on your blood pressure with a goal of systolic blood pressure less than [id number] and diastolic blood pressure less than 90.  Avoid the fluoroquinolone class of antibiotics  Follow-up in 6 months with repeat CT scan  Highly recommend you stop smoking

## 2022-09-11 ENCOUNTER — Telehealth: Payer: Self-pay | Admitting: Internal Medicine

## 2022-09-11 NOTE — Telephone Encounter (Signed)
Mom of PT calling. Needs appt notes fax'd to PCP/Dr. Gerarda Fraction from last appt, that speaks about the "enlarged blood vessel" and has a diag. Thank you.   Fax [phone]

## 2022-09-12 DIAGNOSIS — Z79899 Other long term (current) drug therapy: Secondary | ICD-10-CM | POA: Diagnosis not present

## 2022-09-12 DIAGNOSIS — M961 Postlaminectomy syndrome, not elsewhere classified: Secondary | ICD-10-CM | POA: Diagnosis not present

## 2022-09-12 NOTE — Telephone Encounter (Signed)
OV notes faxed via epic to Dr. Gerarda Fraction

## 2022-09-27 NOTE — Telephone Encounter (Signed)
Pt had the surgical consult appt 3/25. Closing encounter.

## 2022-10-14 DIAGNOSIS — F17210 Nicotine dependence, cigarettes, uncomplicated: Secondary | ICD-10-CM | POA: Diagnosis not present

## 2022-10-14 DIAGNOSIS — M961 Postlaminectomy syndrome, not elsewhere classified: Secondary | ICD-10-CM | POA: Diagnosis not present

## 2022-10-14 DIAGNOSIS — Z79899 Other long term (current) drug therapy: Secondary | ICD-10-CM | POA: Diagnosis not present

## 2022-11-13 DIAGNOSIS — F17210 Nicotine dependence, cigarettes, uncomplicated: Secondary | ICD-10-CM | POA: Diagnosis not present

## 2022-11-13 DIAGNOSIS — Z79899 Other long term (current) drug therapy: Secondary | ICD-10-CM | POA: Diagnosis not present

## 2022-11-13 DIAGNOSIS — M961 Postlaminectomy syndrome, not elsewhere classified: Secondary | ICD-10-CM | POA: Diagnosis not present

## 2022-12-11 DIAGNOSIS — M961 Postlaminectomy syndrome, not elsewhere classified: Secondary | ICD-10-CM | POA: Diagnosis not present

## 2022-12-11 DIAGNOSIS — Z79899 Other long term (current) drug therapy: Secondary | ICD-10-CM | POA: Diagnosis not present

## 2022-12-11 DIAGNOSIS — F17210 Nicotine dependence, cigarettes, uncomplicated: Secondary | ICD-10-CM | POA: Diagnosis not present

## 2022-12-11 DIAGNOSIS — R030 Elevated blood-pressure reading, without diagnosis of hypertension: Secondary | ICD-10-CM | POA: Diagnosis not present

## 2022-12-29 ENCOUNTER — Ambulatory Visit (HOSPITAL_COMMUNITY)
Admission: RE | Admit: 2022-12-29 | Discharge: 2022-12-29 | Disposition: A | Discharge status: Home / Self Care | Payer: Medicare Other | Source: Ambulatory Visit | Attending: Acute Care | Admitting: Acute Care

## 2022-12-29 DIAGNOSIS — J432 Centrilobular emphysema: Secondary | ICD-10-CM | POA: Diagnosis not present

## 2022-12-29 DIAGNOSIS — Z87891 Personal history of nicotine dependence: Secondary | ICD-10-CM | POA: Diagnosis not present

## 2022-12-29 DIAGNOSIS — R911 Solitary pulmonary nodule: Secondary | ICD-10-CM

## 2022-12-29 DIAGNOSIS — R918 Other nonspecific abnormal finding of lung field: Secondary | ICD-10-CM | POA: Diagnosis not present

## 2023-01-08 ENCOUNTER — Telehealth: Payer: Self-pay | Admitting: Acute Care

## 2023-01-08 NOTE — Telephone Encounter (Signed)
Call report received and noted in LCS dashboard. IMPRESSION: 1. Multiple new pulmonary nodules, many of which have an aggressive appearance. Although it is likely that one or more of these is of infectious or inflammatory etiology, many appear concerning for potential neoplasm. Accordingly, today's study is categorized as Lung-RADS 4B, suspicious. Additional imaging evaluation or consultation with Pulmonology or Thoracic Surgery recommended. 2. The "S" modifier above refers to potentially clinically significant non lung cancer related findings. Specifically, there is aortic atherosclerosis, in addition to two-vessel coronary artery disease. Please note that although the presence of coronary artery calcium documents the presence of coronary artery disease, the severity of this disease and any potential stenosis cannot be assessed on this non-gated CT examination. Assessment for potential risk factor modification, dietary therapy or pharmacologic therapy may be warranted, if clinically indicated. 3. Mild diffuse bronchial wall thickening with moderate centrilobular and paraseptal emphysema; imaging findings suggestive of underlying COPD.   These results will be called to the ordering clinician or representative by the Radiologist Assistant, and communication documented in the PACS or [employer].   Aortic Atherosclerosis (ICD10-I70.0) and Emphysema (ICD10-J43.9).

## 2023-01-10 ENCOUNTER — Telehealth: Payer: Self-pay | Admitting: Acute Care

## 2023-01-10 NOTE — Telephone Encounter (Signed)
I have called the patient's sister [name] [facility]) and discussed the results of the lung cancer screening scan. He is deaf, and his sister manages scheduling his care.  His scan was read as a 4 B. There are multiple nodules that have been graded as a 4 B, and other graded as a 4 A. There is a comment that they could be infectious of inflammatory. I asked his sister if he has been sick. She said he has not been sick.  I will have Dr. Tonia Brooms review the scan and most likely we will proceed with PET scan.

## 2023-01-11 ENCOUNTER — Telehealth: Payer: Self-pay | Admitting: Acute Care

## 2023-01-11 ENCOUNTER — Other Ambulatory Visit: Payer: Self-pay | Admitting: Acute Care

## 2023-01-11 DIAGNOSIS — R911 Solitary pulmonary nodule: Secondary | ICD-10-CM

## 2023-01-11 NOTE — Telephone Encounter (Signed)
I have called and spoken with [name], Mr. Dundore sister who is his DPR and manages his medical appointments. I explained that after talking with Dr. Tonia Brooms, we will proceed with PET scan to better evaluate the Multiple new pulmonary nodules noted on his most recent screening scan. She is in agreement with this plan. I have placed the order. The patient will need follow up with Dr. Tonia Brooms on 8/19, or with me or him on 8/21 to go over results and determine plan of care.Joshua [name]

## 2023-01-18 ENCOUNTER — Ambulatory Visit (HOSPITAL_COMMUNITY)
Admission: RE | Admit: 2023-01-18 | Discharge: 2023-01-18 | Disposition: A | Discharge status: Home / Self Care | Payer: Medicare Other | Source: Ambulatory Visit | Attending: Acute Care | Admitting: Acute Care

## 2023-01-18 DIAGNOSIS — R911 Solitary pulmonary nodule: Secondary | ICD-10-CM | POA: Insufficient documentation

## 2023-01-22 ENCOUNTER — Other Ambulatory Visit: Payer: Self-pay | Admitting: Thoracic Surgery (Cardiothoracic Vascular Surgery)

## 2023-01-22 DIAGNOSIS — R911 Solitary pulmonary nodule: Secondary | ICD-10-CM

## 2023-01-25 ENCOUNTER — Encounter (HOSPITAL_COMMUNITY)
Admission: RE | Admit: 2023-01-25 | Discharge: 2023-01-25 | Disposition: A | Discharge status: Home / Self Care | Payer: Medicare Other | Source: Ambulatory Visit | Attending: Acute Care | Admitting: Acute Care

## 2023-01-25 DIAGNOSIS — R911 Solitary pulmonary nodule: Secondary | ICD-10-CM | POA: Insufficient documentation

## 2023-01-25 DIAGNOSIS — R918 Other nonspecific abnormal finding of lung field: Secondary | ICD-10-CM | POA: Diagnosis not present

## 2023-01-25 MED ORDER — FLUDEOXYGLUCOSE F - 18 (FDG) INJECTION
9.6500 | Freq: Once | INTRAVENOUS | Status: AC | PRN
  Administered 2023-01-25: 9.65 via INTRAVENOUS

## 2023-01-28 NOTE — Progress Notes (Unsigned)
Synopsis: Referred in August 2024 for pulmonary nodule sure by Elfredia Nevins, MD  Subjective:   PATIENT ID: Joshua [name] GENDER: male DOB: Sep 27, 1950, MRN: [id number]  No chief complaint on file.   This is a 72 year old gentleman, past medical history of bladder, COPD, stroke 30 years ago.Patient has a history of tobacco use.  Was referred for evaluation of pulmonary nodule.  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  Lung nodule was first seen on follow-up imaging in July 2024 for lung cancer screening.  Ultimately had a nuclear medicine PET scan complete in August 2024.  It has yet to be read.    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   Past Surgical History:  Procedure Laterality Date   BACK SURGERY     BLADDER SURGERY     hard of hearing     HYDROCELE EXCISION Right 11/26/2018   Procedure: RIGHT HYDROCELECTOMY;  Surgeon: Marcine Matar, MD;  Location: [facility];  Service: Urology;  Laterality: Right;   NASAL SINUS SURGERY      Social History   Socioeconomic History   Marital status: Single    Spouse name: Not on file   Number of children: Not on file   Years of education: Not on file   Highest education level: Not on file  Occupational History   Not on file  Tobacco Use   Smoking status: Some Days    Current packs/day: 1.00    Types: Cigarettes   Smokeless tobacco: Current  Substance and Sexual Activity   Alcohol use: No   Drug use: Never   Sexual activity: Yes  Other Topics Concern   Not on file  Social History Narrative   Not on file   Social Determinants of Health   Financial Resource Strain: Not on file  Food Insecurity: Not on file  Transportation Needs: Not  on file  Physical Activity: Not on file  Stress: Not on file  Social Connections: Not on file  Intimate Partner Violence: Not on file     No Known Allergies   Outpatient Medications Prior to Visit  Medication Sig Dispense Refill   albuterol (PROVENTIL HFA;VENTOLIN HFA) 108 (90 BASE) MCG/ACT inhaler Inhale 2 puffs into the lungs every 6 (six) hours as needed for wheezing or shortness of breath.      dextromethorphan-guaiFENesin (MUCINEX DM) 30-600 MG 12hr tablet Take 1 tablet by mouth 2 (two) times daily.     diazepam (VALIUM) 10 MG tablet Take 10 mg by mouth at bedtime.     Fluticasone-Umeclidin-Vilant (TRELEGY ELLIPTA) 100-62.5-25 MCG/ACT AEPB One click each am 28 each 11   HYDROcodone-acetaminophen (NORCO) 10-325 MG tablet Take 1 tablet by mouth every 6 (six) hours as needed.     Oxycodone HCl 20 MG TABS Take 1 tablet by mouth 5 times daily as needed. 150 tablet 0   oxymetazoline (AFRIN) 0.05 % nasal spray Place 1 spray into both nostrils 2 (two) times daily.     No facility-administered medications prior to visit.    ROS   Objective:  Physical Exam   There  were no vitals filed for this visit.   on *** LPM *** RA BMI Readings from Last 3 Encounters:  09/04/22 24.97 kg/m  08/11/22 25.08 kg/m  05/11/22 24.74 kg/m   Wt Readings from Last 3 Encounters:  09/04/22 179 lb (81.2 kg)  08/11/22 179 lb 12.8 oz (81.6 kg)  05/11/22 177 lb 6.4 oz (80.5 kg)     CBC    Component Value Date/Time   WBC 6.7 03/09/2022 1547   WBC 6.3 11/22/2018 1313   RBC 3.98 (L) 03/09/2022 1547   RBC 3.80 (L) 11/22/2018 1313   HGB 11.7 (L) 03/09/2022 1547   HCT 35.8 (L) 03/09/2022 1547   PLT 229 03/09/2022 1547   MCV 90 03/09/2022 1547   MCH 29.4 03/09/2022 1547   MCH 31.1 11/22/2018 1313   MCHC 32.7 03/09/2022 1547   MCHC 31.6 11/22/2018 1313   RDW 13.8 03/09/2022 1547   LYMPHSABS 0.4 (L) 03/09/2022 1547   MONOABS 0.8 11/22/2018 1313   EOSABS 0.0 03/09/2022 1547   BASOSABS 0.0  03/09/2022 1547    ***  Chest Imaging: ***  Pulmonary Functions Testing Results:     No data to display          FeNO: ***  Pathology: ***  Echocardiogram: ***  Heart Catheterization: ***    Assessment & Plan:   No diagnosis found.  Discussion: ***   Current Outpatient Medications:    albuterol (PROVENTIL HFA;VENTOLIN HFA) 108 (90 BASE) MCG/ACT inhaler, Inhale 2 puffs into the lungs every 6 (six) hours as needed for wheezing or shortness of breath. , Disp: , Rfl:    dextromethorphan-guaiFENesin (MUCINEX DM) 30-600 MG 12hr tablet, Take 1 tablet by mouth 2 (two) times daily., Disp: , Rfl:    diazepam (VALIUM) 10 MG tablet, Take 10 mg by mouth at bedtime., Disp: , Rfl:    Fluticasone-Umeclidin-Vilant (TRELEGY ELLIPTA) 100-62.5-25 MCG/ACT AEPB, One click each am, Disp: 28 each, Rfl: 11   HYDROcodone-acetaminophen (NORCO) 10-325 MG tablet, Take 1 tablet by mouth every 6 (six) hours as needed., Disp: , Rfl:    Oxycodone HCl 20 MG TABS, Take 1 tablet by mouth 5 times daily as needed., Disp: 150 tablet, Rfl: 0   oxymetazoline (AFRIN) 0.05 % nasal spray, Place 1 spray into both nostrils 2 (two) times daily., Disp: , Rfl:   I spent *** minutes dedicated to the care of this patient on the date of this encounter to include pre-visit review of records, face-to-face time with the patient discussing conditions above, post visit ordering of testing, clinical documentation with the electronic health record, making appropriate referrals as documented, and communicating necessary findings to members of the patients care team.   Josephine Igo, DO [facility] 01/28/2023 2:38 PM

## 2023-01-29 ENCOUNTER — Ambulatory Visit: Payer: Medicare Other | Admitting: Pulmonary Disease

## 2023-01-29 ENCOUNTER — Encounter: Payer: Self-pay | Admitting: Pulmonary Disease

## 2023-01-29 VITALS — BP 130/82 | HR 65 | Ht 71.0 in | Wt 182.2 lb

## 2023-01-29 DIAGNOSIS — R911 Solitary pulmonary nodule: Secondary | ICD-10-CM | POA: Diagnosis not present

## 2023-01-29 DIAGNOSIS — Z720 Tobacco use: Secondary | ICD-10-CM

## 2023-01-29 NOTE — Patient Instructions (Signed)
Thank you for visiting Dr. Tonia Brooms at [facility]. Today we recommend the following:  Orders Placed This Encounter  Procedures   Ambulatory referral to Cardiothoracic Surgery   Pulmonary Function Test   Return if symptoms worsen or fail to improve, for with Thoracic Surgery , after PFTs.    Please do your part to reduce the spread of COVID-19.

## 2023-01-30 DIAGNOSIS — C679 Malignant neoplasm of bladder, unspecified: Secondary | ICD-10-CM | POA: Diagnosis not present

## 2023-01-30 DIAGNOSIS — M549 Dorsalgia, unspecified: Secondary | ICD-10-CM | POA: Diagnosis not present

## 2023-02-01 ENCOUNTER — Ambulatory Visit (HOSPITAL_COMMUNITY)
Admission: RE | Admit: 2023-02-01 | Discharge: 2023-02-01 | Disposition: A | Discharge status: Home / Self Care | Payer: Medicare Other | Source: Ambulatory Visit | Attending: Pulmonary Disease | Admitting: Pulmonary Disease

## 2023-02-01 DIAGNOSIS — M961 Postlaminectomy syndrome, not elsewhere classified: Secondary | ICD-10-CM | POA: Diagnosis not present

## 2023-02-01 DIAGNOSIS — R0609 Other forms of dyspnea: Secondary | ICD-10-CM | POA: Insufficient documentation

## 2023-02-01 DIAGNOSIS — R942 Abnormal results of pulmonary function studies: Secondary | ICD-10-CM | POA: Insufficient documentation

## 2023-02-01 DIAGNOSIS — J439 Emphysema, unspecified: Secondary | ICD-10-CM | POA: Diagnosis not present

## 2023-02-01 DIAGNOSIS — R918 Other nonspecific abnormal finding of lung field: Secondary | ICD-10-CM | POA: Insufficient documentation

## 2023-02-01 DIAGNOSIS — Z79899 Other long term (current) drug therapy: Secondary | ICD-10-CM | POA: Diagnosis not present

## 2023-02-01 DIAGNOSIS — R911 Solitary pulmonary nodule: Secondary | ICD-10-CM | POA: Insufficient documentation

## 2023-02-01 LAB — PULMONARY FUNCTION TEST
DL/VA % pred: 59 %
DL/VA: 2.38 ml/min/mmHg/L
DLCO unc % pred: 59 %
DLCO unc: 15.86 ml/min/mmHg
FEF 25-75 Post: 0.83 L/sec
FEF 25-75 Pre: 0.56 L/sec
FEF2575-%Change-Post: 46 %
FEF2575-%Pred-Post: 32 %
FEF2575-%Pred-Pre: 21 %
FEV1-%Change-Post: 12 %
FEV1-%Pred-Post: 62 %
FEV1-%Pred-Pre: 55 %
FEV1-Post: 2.11 L
FEV1-Pre: 1.87 L
FEV1FVC-%Change-Post: 7 %
FEV1FVC-%Pred-Pre: 59 %
FEV6-%Change-Post: 12 %
FEV6-%Pred-Post: 92 %
FEV6-%Pred-Pre: 82 %
FEV6-Post: 4.01 L
FEV6-Pre: 3.57 L
FEV6FVC-%Change-Post: 7 %
FEV6FVC-%Pred-Post: 95 %
FEV6FVC-%Pred-Pre: 88 %
FVC-%Change-Post: 4 %
FVC-%Pred-Post: 96 %
FVC-%Pred-Pre: 92 %
FVC-Post: 4.45 L
FVC-Pre: 4.25 L
Post FEV1/FVC ratio: 47 %
Post FEV6/FVC ratio: 90 %
Pre FEV1/FVC ratio: 44 %
Pre FEV6/FVC Ratio: 84 %
RV % pred: 195 %
RV: 4.89 L
TLC % pred: 129 %
TLC: 9.34 L

## 2023-02-01 MED ORDER — ALBUTEROL SULFATE (2.5 MG/3ML) 0.083% IN NEBU
2.5000 mg | INHALATION_SOLUTION | Freq: Once | RESPIRATORY_TRACT | Status: AC
  Administered 2023-02-01: 2.5 mg via RESPIRATORY_TRACT

## 2023-02-05 DIAGNOSIS — Z79899 Other long term (current) drug therapy: Secondary | ICD-10-CM | POA: Diagnosis not present

## 2023-02-23 ENCOUNTER — Other Ambulatory Visit: Payer: Medicare Other

## 2023-02-23 ENCOUNTER — Ambulatory Visit: Payer: Medicare Other | Admitting: Thoracic Surgery (Cardiothoracic Vascular Surgery)

## 2023-03-01 ENCOUNTER — Institutional Professional Consult (permissible substitution) (INDEPENDENT_AMBULATORY_CARE_PROVIDER_SITE_OTHER): Payer: Medicare Other | Admitting: Thoracic Surgery (Cardiothoracic Vascular Surgery)

## 2023-03-01 VITALS — BP 145/79 | HR 66 | Resp 20 | Ht 71.0 in | Wt 182.0 lb

## 2023-03-01 DIAGNOSIS — R911 Solitary pulmonary nodule: Secondary | ICD-10-CM

## 2023-03-02 ENCOUNTER — Encounter: Payer: Medicare Other | Admitting: Thoracic Surgery (Cardiothoracic Vascular Surgery)

## 2023-03-02 DIAGNOSIS — Z79899 Other long term (current) drug therapy: Secondary | ICD-10-CM | POA: Diagnosis not present

## 2023-03-02 DIAGNOSIS — M961 Postlaminectomy syndrome, not elsewhere classified: Secondary | ICD-10-CM | POA: Diagnosis not present

## 2023-03-06 ENCOUNTER — Other Ambulatory Visit: Payer: Self-pay | Admitting: *Deleted

## 2023-03-06 ENCOUNTER — Institutional Professional Consult (permissible substitution) (INDEPENDENT_AMBULATORY_CARE_PROVIDER_SITE_OTHER): Payer: Medicare Other | Admitting: Thoracic Surgery (Cardiothoracic Vascular Surgery)

## 2023-03-06 ENCOUNTER — Encounter: Payer: Self-pay | Admitting: *Deleted

## 2023-03-06 ENCOUNTER — Other Ambulatory Visit: Payer: Self-pay | Admitting: Thoracic Surgery (Cardiothoracic Vascular Surgery)

## 2023-03-06 VITALS — BP 128/78 | HR 79 | Resp 18 | Ht 71.0 in | Wt 186.0 lb

## 2023-03-06 DIAGNOSIS — R911 Solitary pulmonary nodule: Secondary | ICD-10-CM

## 2023-03-06 DIAGNOSIS — Z79899 Other long term (current) drug therapy: Secondary | ICD-10-CM | POA: Diagnosis not present

## 2023-03-06 NOTE — H&P (View-Only) (Signed)
[address]       [place] [address]             [phone]                    Joshua [name] [facility] Medical Record #[id number] Date of Birth: [birth year]  Referring: Josephine Igo, DO Primary Care: Elfredia Nevins, MD Primary Cardiologist: None  Chief Complaint:    Chief Complaint  Patient presents with   Lung Lesion    PET 8/15    History of Present Illness:    Joshua [name] 72 y.o. male presents for surgical evaluation of a left upper lobe pulmonary nodule.  He has a past medical history of bladder, COPD, stroke 30 years ago.Patient has a history of tobacco use. Was referred for evaluation of pulmonary nodule. 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 Lung nodule was first seen on follow-up imaging in July 2024 for lung cancer screening. Ultimately had a nuclear medicine PET scan complete in August 2024. Small nodule in the left lung concerning for a stage 1 lung cancer.        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   Social History   Tobacco Use  Smoking Status Some Days   Current packs/day: 1.00   Types: Cigarettes  Smokeless Tobacco Current  Tobacco Comments   Using snuff.    Social History   Substance and Sexual Activity  Alcohol Use No     No Known Allergies  Current Outpatient Medications  Medication Sig Dispense Refill   albuterol  (PROVENTIL HFA;VENTOLIN HFA) 108 (90 BASE) MCG/ACT inhaler Inhale 2 puffs into the lungs every 6 (six) hours as needed for wheezing or shortness of breath.      diazepam (VALIUM) 10 MG tablet Take 10 mg by mouth every 8 (eight) hours as needed for anxiety or sleep.     Fluticasone-Umeclidin-Vilant (TRELEGY ELLIPTA) 100-62.5-25 MCG/ACT AEPB One click each am 28 each 11   Oxycodone HCl 20 MG TABS Take 1 tablet by mouth 5 times daily as needed. (Patient not taking: Reported on 03/07/2023) 150 tablet 0   oxymetazoline (AFRIN) 0.05 % nasal spray Place 1 spray into both nostrils 2 (two) times daily.     atorvastatin (LIPITOR) 20 MG tablet Take 20 mg by mouth daily.     cyclobenzaprine (FLEXERIL) 10 MG tablet Take 10 mg by mouth 3 (three) times daily as needed for muscle spasms.     gabapentin (NEURONTIN) 300 MG capsule Take 300 mg by mouth 3 (three) times daily.     nabumetone (RELAFEN) 500 MG tablet Take 500 mg by mouth in the morning, at noon, and at bedtime.  oxyCODONE (ROXICODONE) 15 MG immediate release tablet Take 15 mg by mouth 5 (five) times daily.     No current facility-administered medications for this visit.    Review of Systems  Constitutional: Negative.   Respiratory:  Negative for cough and shortness of breath.   Cardiovascular:  Negative for chest pain.  Neurological: Negative.      PHYSICAL EXAMINATION: BP 128/78 (BP Location: Left Arm, Patient Position: Sitting)   Pulse 79   Resp 18   Ht 5\' 11"  (1.803 m)   Wt 186 lb (84.4 kg)   SpO2 95% Comment: ra  BMI 25.94 kg/m  Physical Exam Constitutional:      General: He is not in acute distress.    Appearance: He is not ill-appearing.  HENT:     Head: Normocephalic and atraumatic.  Eyes:     Extraocular Movements: Extraocular movements intact.  Cardiovascular:     Rate and Rhythm: Normal rate.  Pulmonary:     Effort: Pulmonary effort is normal. No respiratory distress.  Abdominal:     General: Abdomen is flat. There  is no distension.  Musculoskeletal:        General: Normal range of motion.     Cervical back: Normal range of motion.  Skin:    General: Skin is warm and dry.  Neurological:     Mental Status: He is alert and oriented to person, place, and time.          I have independently reviewed the above radiology studies  and reviewed the findings with the patient.   Recent Lab Findings: Lab Results  Component Value Date   WBC 6.7 03/09/2022   HGB 11.7 (L) 03/09/2022   HCT 35.8 (L) 03/09/2022   PLT 229 03/09/2022   GLUCOSE 160 (H) 03/09/2022   ALT 14 (L) 08/02/2016   AST 26 08/02/2016   NA 138 03/09/2022   K 4.8 03/09/2022   CL 101 03/09/2022   CREATININE 1.47 (H) 03/09/2022   BUN 13 03/09/2022   CO2 22 03/09/2022   TSH 0.830 03/09/2022   INR 1.02 10/02/2015    Diagnostic Studies & Laboratory data:     Recent Radiology Findings:   No results found.   PFTs:  - FVC: 92% - FEV1: 55% -DLCO: 59%  IMPRESSION: 1. Band of hypermetabolic nodular thickening in the medial aspect of the LEFT upper lobe. Differential would include bronchogenic carcinoma versus hypermetabolic inflammatory tissue. Nodularity is new from CT 08/02/2016. Recommend tissue sampling. 2. Bilateral suspicious pulmonary nodules described on most recent CT scan have resolved for the most part. Mild metabolically active nodularity along the horizontal fissure is favored inflammatory. 3. No evidence of metastatic lymphadenopathy or distant metastatic disease.    Assessment / Plan:   72yo male with left upper lobe pulmonary nodule concerning for primary lung cancer.  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  We discussed the risks and benefits of a L RATS, wedge resection, possible lobectomy.  He is agreeable to proceed.     I  spent 60 minutes with  the patient face to face in counseling and coordination of care.    Corliss Skains 03/07/2023 12:17 PM

## 2023-03-06 NOTE — Progress Notes (Unsigned)
[address]       [place] [address]             [phone]                    Joshua [name] [facility] Medical Record #[id number] Date of Birth: 1951-05-01  Referring: Josephine Igo, DO Primary Care: Elfredia Nevins, MD Primary Cardiologist: None  Chief Complaint:    Chief Complaint  Patient presents with  . Lung Mass    History of Present Illness:    Joshua [name] 72 y.o. male presents for surgical evaluation of a ***   This is a 72 year old gentleman, past medical history of bladder, COPD, stroke 30 years ago.Patient has a history of tobacco use. Was referred for evaluation of pulmonary nodule. 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 Lung nodule was first seen on follow-up imaging in July 2024 for lung cancer screening. Ultimately had a nuclear medicine PET scan complete in August 2024. Small nodule in the left lung concerning for a stage 1 lung cancer.   Smoking Hx: ***      Past Medical History:  Diagnosis Date  . Back pain   . 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 Chronic pain   . COPD (chronic obstructive pulmonary disease) (HCC)   . Meniere's disease   . PONV (postoperative nausea and vomiting)   . Stroke [facility])    30 years ago    Past Surgical History:  Procedure Laterality Date  . BACK SURGERY    . BLADDER SURGERY    . hard of hearing    . HYDROCELE EXCISION Right 11/26/2018   Procedure: RIGHT HYDROCELECTOMY;  Surgeon: Marcine Matar, MD;  Location: [facility];  Service: Urology;  Laterality: Right;  . NASAL SINUS SURGERY      No family history on file.   Social History   Tobacco Use  Smoking Status Some Days  . Current packs/day: 1.00  . Types: Cigarettes  Smokeless Tobacco Current  Tobacco Comments   Using snuff.    Social History   Substance and Sexual Activity  Alcohol Use No     No Known Allergies  Current Outpatient Medications  Medication Sig Dispense Refill  .  albuterol (PROVENTIL HFA;VENTOLIN HFA) 108 (90 BASE) MCG/ACT inhaler Inhale 2 puffs into the lungs every 6 (six) hours as needed for wheezing or shortness of breath.     . dextromethorphan-guaiFENesin (MUCINEX DM) 30-600 MG 12hr tablet Take 1 tablet by mouth 2 (two) times daily.    . diazepam (VALIUM) 10 MG tablet Take 10 mg by mouth at bedtime.    . Fluticasone-Umeclidin-Vilant (TRELEGY ELLIPTA) 100-62.5-25 MCG/ACT AEPB One click each am 28 each 11  . HYDROcodone-acetaminophen (NORCO) 10-325 MG tablet Take 1 tablet by mouth every 6 (six) hours as needed.    . Oxycodone HCl 20 MG TABS Take 1 tablet by mouth 5 times daily as needed. (Patient taking differently: Take 15 mg by mouth.) 150 tablet 0  . oxymetazoline (AFRIN) 0.05 % nasal spray Place 1 spray into both nostrils 2 (two) times daily.     No current facility-administered medications for this visit.    ROS   PHYSICAL EXAMINATION: Ht 5\' 11"  (1.803 m)   BMI 25.38 kg/m  Physical Exam       I have independently reviewed the above radiology studies  and reviewed the findings  with the patient.   Recent Lab Findings: Lab Results  Component Value Date   WBC 6.7 03/09/2022   HGB 11.7 (L) 03/09/2022   HCT 35.8 (L) 03/09/2022   PLT 229 03/09/2022   GLUCOSE 160 (H) 03/09/2022   ALT 14 (L) 08/02/2016   AST 26 08/02/2016   NA 138 03/09/2022   K 4.8 03/09/2022   CL 101 03/09/2022   CREATININE 1.47 (H) 03/09/2022   BUN 13 03/09/2022   CO2 22 03/09/2022   TSH 0.830 03/09/2022   INR 1.02 10/02/2015    Diagnostic Studies & Laboratory data:     Recent Radiology Findings:   No results found.   PFTs:  - FVC: 92% - FEV1: 55% -DLCO: 59%  IMPRESSION: 1. Band of hypermetabolic nodular thickening in the medial aspect of the LEFT upper lobe. Differential would include bronchogenic carcinoma versus hypermetabolic inflammatory tissue. Nodularity is new from CT 08/02/2016. Recommend tissue sampling. 2. Bilateral suspicious  pulmonary nodules described on most recent CT scan have resolved for the most part. Mild metabolically active nodularity along the horizontal fissure is favored inflammatory. 3. No evidence of metastatic lymphadenopathy or distant metastatic disease.    Assessment / Plan:   ***     I  spent {CHL ONC TIME VISIT - VZDGL:[id number]} with  the patient face to face in counseling and coordination of care.    Corliss Skains 03/06/2023 1:56 PM

## 2023-03-07 ENCOUNTER — Telehealth (HOSPITAL_COMMUNITY): Payer: Self-pay | Admitting: *Deleted

## 2023-03-07 NOTE — Telephone Encounter (Signed)
Per DPR spoke with sister [name] and gave detailed instructions for MPI.

## 2023-03-08 ENCOUNTER — Ambulatory Visit (HOSPITAL_COMMUNITY): Payer: Medicare Other | Attending: Thoracic Surgery (Cardiothoracic Vascular Surgery)

## 2023-03-08 ENCOUNTER — Encounter (HOSPITAL_COMMUNITY): Payer: Self-pay | Admitting: *Deleted

## 2023-03-08 DIAGNOSIS — Z01810 Encounter for preprocedural cardiovascular examination: Secondary | ICD-10-CM | POA: Diagnosis not present

## 2023-03-08 DIAGNOSIS — R911 Solitary pulmonary nodule: Secondary | ICD-10-CM | POA: Diagnosis not present

## 2023-03-08 LAB — MYOCARDIAL PERFUSION IMAGING: Rest Nuclear Isotope Dose: 10.5 mCi

## 2023-03-08 MED ORDER — TECHNETIUM TC 99M TETROFOSMIN IV KIT
10.5000 | PACK | Freq: Once | INTRAVENOUS | Status: AC | PRN
  Administered 2023-03-08: 10.5 via INTRAVENOUS

## 2023-03-09 ENCOUNTER — Ambulatory Visit (HOSPITAL_COMMUNITY): Payer: Medicare Other | Attending: Cardiovascular Disease

## 2023-03-09 DIAGNOSIS — R911 Solitary pulmonary nodule: Secondary | ICD-10-CM | POA: Insufficient documentation

## 2023-03-09 MED ORDER — REGADENOSON 0.4 MG/5ML IV SOLN
0.4000 mg | Freq: Once | INTRAVENOUS | Status: AC
  Administered 2023-03-09: 0.4 mg via INTRAVENOUS

## 2023-03-09 MED ORDER — TECHNETIUM TC 99M TETROFOSMIN IV KIT
31.2000 | PACK | Freq: Once | INTRAVENOUS | Status: AC | PRN
  Administered 2023-03-09: 31.2 via INTRAVENOUS

## 2023-03-09 NOTE — Pre-Procedure Instructions (Signed)
Surgical Instructions   Your procedure is scheduled on Wednesday, October 2nd. Report to [facility] Main Entrance "A" at 11:15 A.M., then check in with the Admitting office. Any questions or running late day of surgery: call [phone]  Questions prior to your surgery date: call [phone], Monday-Friday, 8am-4pm. If you experience any cold or flu symptoms such as cough, fever, chills, shortness of breath, etc. between now and your scheduled surgery, please notify us at the above number.     Remember:  Do not eat or drink after midnight the night before your surgery     Take these medicines the morning of surgery with A SIP OF WATER  atorvastatin (LIPITOR)  Fluticasone-Umeclidin-Vilant (TRELEGY ELLIPTA)  gabapentin (NEURONTIN)  oxyCODONE (ROXICODONE)  oxymetazoline (AFRIN)   May take these medicines IF NEEDED: cyclobenzaprine (FLEXERIL)  albuterol (PROVENTIL HFA;VENTOLIN HFA)- bring inhaler with you on day of surgery diazepam (VALIUM)     One week prior to surgery, STOP taking any Aspirin (unless otherwise instructed by your surgeon) Aleve, Naproxen, Ibuprofen, Motrin, Advil, nabumetone (RELAFEN), Goody's, BC's, all herbal medications, fish oil, and non-prescription vitamins.                     Do NOT Smoke (Tobacco/Vaping) for 24 hours prior to your procedure.  If you use a CPAP at night, you may bring your mask/headgear for your overnight stay.   You will be asked to remove any contacts, glasses, piercing's, hearing aid's, dentures/partials prior to surgery. Please bring cases for these items if needed.    Patients discharged the day of surgery will not be allowed to drive home, and someone needs to stay with them for 24 hours.  SURGICAL WAITING ROOM VISITATION Patients may have no more than 2 support people in the waiting area - these visitors may rotate.   Pre-op nurse will coordinate an appropriate time for 1 ADULT support person, who may not rotate, to accompany  patient in pre-op.  Children under the age of 2 must have an adult with them who is not the patient and must remain in the main waiting area with an adult.  If the patient needs to stay at the hospital during part of their recovery, the visitor guidelines for inpatient rooms apply.  Please refer to the [facility] website for the visitor guidelines for any additional information.   If you received a COVID test during your pre-op visit  it is requested that you wear a mask when out in public, stay away from anyone that may not be feeling well and notify your surgeon if you develop symptoms. If you have been in contact with anyone that has tested positive in the last 10 days please notify you surgeon.      Pre-operative CHG Bathing Instructions   You can play a key role in reducing the risk of infection after surgery. Your skin needs to be as free of germs as possible. You can reduce the number of germs on your skin by washing with CHG (chlorhexidine gluconate) soap before surgery. CHG is an antiseptic soap that kills germs and continues to kill germs even after washing.   DO NOT use if you have an allergy to chlorhexidine/CHG or antibacterial soaps. If your skin becomes reddened or irritated, stop using the CHG and notify one of our RNs at [phone].              TAKE A SHOWER THE NIGHT BEFORE SURGERY AND THE DAY OF SURGERY  Please keep in mind the following:  DO NOT shave, including legs and underarms, 48 hours prior to surgery.   You may shave your face before/day of surgery.  Place clean sheets on your bed the night before surgery Use a clean washcloth (not used since being washed) for each shower. DO NOT sleep with pet's night before surgery.  CHG Shower Instructions:  Wash your face and private area with normal soap. If you choose to wash your hair, wash first with your normal shampoo.  After you use shampoo/soap, rinse your hair and body thoroughly to remove shampoo/soap  residue.  Turn the water OFF and apply half the bottle of CHG soap to a CLEAN washcloth.  Apply CHG soap ONLY FROM YOUR NECK DOWN TO YOUR TOES (washing for 3-5 minutes)  DO NOT use CHG soap on face, private areas, open wounds, or sores.  Pay special attention to the area where your surgery is being performed.  If you are having back surgery, having someone wash your back for you may be helpful. Wait 2 minutes after CHG soap is applied, then you may rinse off the CHG soap.  Pat dry with a clean towel  Put on clean pajamas    Additional instructions for the day of surgery: DO NOT APPLY any lotions, deodorants, cologne, or perfumes.   Do not wear jewelry or makeup Do not wear nail polish, gel polish, artificial nails, or any other type of covering on natural nails (fingers and toes) Do not bring valuables to the hospital. [facility] is not responsible for valuables/personal belongings. Put on clean/comfortable clothes.  Please brush your teeth.  Ask your nurse before applying any prescription medications to the skin.

## 2023-03-12 ENCOUNTER — Other Ambulatory Visit: Payer: Self-pay

## 2023-03-12 ENCOUNTER — Ambulatory Visit (HOSPITAL_COMMUNITY)
Admission: RE | Admit: 2023-03-12 | Discharge: 2023-03-12 | Disposition: A | Discharge status: Home / Self Care | Payer: Medicare Other | Source: Ambulatory Visit | Attending: Thoracic Surgery (Cardiothoracic Vascular Surgery) | Admitting: Thoracic Surgery (Cardiothoracic Vascular Surgery)

## 2023-03-12 ENCOUNTER — Encounter (HOSPITAL_COMMUNITY): Payer: Self-pay

## 2023-03-12 ENCOUNTER — Encounter (HOSPITAL_COMMUNITY)
Admission: RE | Admit: 2023-03-12 | Discharge: 2023-03-12 | Disposition: A | Discharge status: Home / Self Care | Payer: Medicare Other | Source: Ambulatory Visit | Attending: Thoracic Surgery (Cardiothoracic Vascular Surgery) | Admitting: Thoracic Surgery (Cardiothoracic Vascular Surgery)

## 2023-03-12 VITALS — BP 114/68 | HR 72 | Temp 98.4°F | Resp 17 | Ht 68.0 in | Wt 185.0 lb

## 2023-03-12 DIAGNOSIS — Z8551 Personal history of malignant neoplasm of bladder: Secondary | ICD-10-CM | POA: Diagnosis not present

## 2023-03-12 DIAGNOSIS — J95812 Postprocedural air leak: Secondary | ICD-10-CM | POA: Diagnosis not present

## 2023-03-12 DIAGNOSIS — Z1152 Encounter for screening for COVID-19: Secondary | ICD-10-CM | POA: Insufficient documentation

## 2023-03-12 DIAGNOSIS — F17210 Nicotine dependence, cigarettes, uncomplicated: Secondary | ICD-10-CM | POA: Diagnosis not present

## 2023-03-12 DIAGNOSIS — M549 Dorsalgia, unspecified: Secondary | ICD-10-CM | POA: Diagnosis not present

## 2023-03-12 DIAGNOSIS — Z01818 Encounter for other preprocedural examination: Secondary | ICD-10-CM | POA: Insufficient documentation

## 2023-03-12 DIAGNOSIS — R918 Other nonspecific abnormal finding of lung field: Secondary | ICD-10-CM | POA: Diagnosis not present

## 2023-03-12 DIAGNOSIS — C3412 Malignant neoplasm of upper lobe, left bronchus or lung: Secondary | ICD-10-CM | POA: Diagnosis not present

## 2023-03-12 DIAGNOSIS — J449 Chronic obstructive pulmonary disease, unspecified: Secondary | ICD-10-CM | POA: Diagnosis not present

## 2023-03-12 DIAGNOSIS — R911 Solitary pulmonary nodule: Secondary | ICD-10-CM | POA: Insufficient documentation

## 2023-03-12 DIAGNOSIS — G8929 Other chronic pain: Secondary | ICD-10-CM | POA: Diagnosis not present

## 2023-03-12 DIAGNOSIS — R001 Bradycardia, unspecified: Secondary | ICD-10-CM | POA: Diagnosis not present

## 2023-03-12 DIAGNOSIS — D72829 Elevated white blood cell count, unspecified: Secondary | ICD-10-CM | POA: Diagnosis not present

## 2023-03-12 DIAGNOSIS — Z8673 Personal history of transient ischemic attack (TIA), and cerebral infarction without residual deficits: Secondary | ICD-10-CM | POA: Diagnosis not present

## 2023-03-12 DIAGNOSIS — H9190 Unspecified hearing loss, unspecified ear: Secondary | ICD-10-CM | POA: Diagnosis not present

## 2023-03-12 DIAGNOSIS — J95811 Postprocedural pneumothorax: Secondary | ICD-10-CM | POA: Diagnosis not present

## 2023-03-12 DIAGNOSIS — J439 Emphysema, unspecified: Secondary | ICD-10-CM | POA: Diagnosis not present

## 2023-03-12 DIAGNOSIS — Z79899 Other long term (current) drug therapy: Secondary | ICD-10-CM | POA: Diagnosis not present

## 2023-03-12 HISTORY — DX: Unspecified osteoarthritis, unspecified site: M19.90

## 2023-03-12 HISTORY — DX: Unspecified hearing loss, unspecified ear: H91.90

## 2023-03-12 HISTORY — DX: Solitary pulmonary nodule: R91.1

## 2023-03-12 LAB — COMPREHENSIVE METABOLIC PANEL
ALT: 20 U/L (ref 0–44)
AST: 23 U/L (ref 15–41)
Albumin: 4 g/dL (ref 3.5–5.0)
Alkaline Phosphatase: 42 U/L (ref 38–126)
Anion gap: 8 (ref 5–15)
BUN: 11 mg/dL (ref 8–23)
CO2: 28 mmol/L (ref 22–32)
Calcium: 9.4 mg/dL (ref 8.9–10.3)
Chloride: 103 mmol/L (ref 98–111)
Creatinine, Ser: 1.29 mg/dL — ABNORMAL HIGH (ref 0.61–1.24)
GFR, Estimated: 60 mL/min — ABNORMAL LOW (ref >60)
Glucose, Bld: 103 mg/dL — ABNORMAL HIGH (ref 70–99)
Potassium: 4.4 mmol/L (ref 3.5–5.1)
Sodium: 139 mmol/L (ref 135–145)
Total Bilirubin: 0.5 mg/dL (ref 0.3–1.2)
Total Protein: 7 g/dL (ref 6.5–8.1)

## 2023-03-12 LAB — MYOCARDIAL PERFUSION IMAGING
Base ST Depression (mm): 0 mm
LV dias vol: 105 mL (ref 62–150)
LV sys vol: 59 mL
Nuc Stress EF: 44 %
Peak HR: 76 {beats}/min
Rest HR: 57 {beats}/min
SDS: 0
SRS: 0
SSS: 0
ST Depression (mm): 0 mm
Stress Nuclear Isotope Dose: 31.2 mCi
TID: 1.03

## 2023-03-12 LAB — TYPE AND SCREEN
ABO/RH(D): A POS
Antibody Screen: NEGATIVE

## 2023-03-12 LAB — CBC
HCT: 36.1 % — ABNORMAL LOW (ref 39.0–52.0)
Hemoglobin: 11.5 g/dL — ABNORMAL LOW (ref 13.0–17.0)
MCH: 28.8 pg (ref 26.0–34.0)
MCHC: 31.9 g/dL (ref 30.0–36.0)
MCV: 90.5 fL (ref 80.0–100.0)
Platelets: 260 10*3/uL (ref 150–400)
RBC: 3.99 MIL/uL — ABNORMAL LOW (ref 4.22–5.81)
RDW: 13.6 % (ref 11.5–15.5)
WBC: 7.3 10*3/uL (ref 4.0–10.5)
nRBC: 0 % (ref 0.0–0.2)

## 2023-03-12 LAB — APTT: aPTT: 34 s (ref 24–36)

## 2023-03-12 LAB — URINALYSIS, ROUTINE W REFLEX MICROSCOPIC
Bilirubin Urine: NEGATIVE
Glucose, UA: NEGATIVE mg/dL
Hgb urine dipstick: NEGATIVE
Ketones, ur: NEGATIVE mg/dL
Leukocytes,Ua: NEGATIVE
Nitrite: NEGATIVE
Protein, ur: NEGATIVE mg/dL
Specific Gravity, Urine: 1.011 (ref 1.005–1.030)
pH: 6 (ref 5.0–8.0)

## 2023-03-12 LAB — SARS CORONAVIRUS 2 (TAT 6-24 HRS): SARS Coronavirus 2: NEGATIVE

## 2023-03-12 LAB — PROTIME-INR
INR: 1.1 (ref 0.8–1.2)
Prothrombin Time: 14 s (ref 11.4–15.2)

## 2023-03-12 LAB — SURGICAL PCR SCREEN
MRSA, PCR: NEGATIVE
Staphylococcus aureus: POSITIVE — AB
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
## 2023-03-14 ENCOUNTER — Other Ambulatory Visit: Payer: Self-pay

## 2023-03-14 ENCOUNTER — Inpatient Hospital Stay (HOSPITAL_COMMUNITY): Payer: Medicare Other | Admitting: Anesthesiology

## 2023-03-14 ENCOUNTER — Encounter (HOSPITAL_COMMUNITY): Payer: Self-pay | Admitting: Thoracic Surgery (Cardiothoracic Vascular Surgery)

## 2023-03-14 ENCOUNTER — Encounter (HOSPITAL_COMMUNITY)
Admission: RE | Disposition: A | Discharge status: Home / Self Care | Payer: Self-pay | Source: Home / Self Care | Attending: Thoracic Surgery (Cardiothoracic Vascular Surgery)

## 2023-03-14 ENCOUNTER — Inpatient Hospital Stay (HOSPITAL_COMMUNITY)
Admission: RE | Admit: 2023-03-14 | Discharge: 2023-03-17 | DRG: 164 | Disposition: A | Discharge status: Home / Self Care | Payer: Medicare Other | Attending: Thoracic Surgery (Cardiothoracic Vascular Surgery) | Admitting: Thoracic Surgery (Cardiothoracic Vascular Surgery)

## 2023-03-14 ENCOUNTER — Inpatient Hospital Stay (HOSPITAL_COMMUNITY): Payer: Medicare Other

## 2023-03-14 DIAGNOSIS — R911 Solitary pulmonary nodule: Secondary | ICD-10-CM | POA: Diagnosis not present

## 2023-03-14 DIAGNOSIS — R918 Other nonspecific abnormal finding of lung field: Secondary | ICD-10-CM | POA: Diagnosis not present

## 2023-03-14 DIAGNOSIS — M549 Dorsalgia, unspecified: Secondary | ICD-10-CM | POA: Diagnosis present

## 2023-03-14 DIAGNOSIS — Z902 Acquired absence of lung [part of]: Principal | ICD-10-CM

## 2023-03-14 DIAGNOSIS — G8929 Other chronic pain: Secondary | ICD-10-CM | POA: Diagnosis present

## 2023-03-14 DIAGNOSIS — R001 Bradycardia, unspecified: Secondary | ICD-10-CM | POA: Diagnosis not present

## 2023-03-14 DIAGNOSIS — F17210 Nicotine dependence, cigarettes, uncomplicated: Secondary | ICD-10-CM | POA: Diagnosis present

## 2023-03-14 DIAGNOSIS — J9811 Atelectasis: Secondary | ICD-10-CM | POA: Diagnosis not present

## 2023-03-14 DIAGNOSIS — Z48813 Encounter for surgical aftercare following surgery on the respiratory system: Secondary | ICD-10-CM | POA: Diagnosis not present

## 2023-03-14 DIAGNOSIS — R9389 Abnormal findings on diagnostic imaging of other specified body structures: Secondary | ICD-10-CM | POA: Diagnosis not present

## 2023-03-14 DIAGNOSIS — C3412 Malignant neoplasm of upper lobe, left bronchus or lung: Secondary | ICD-10-CM | POA: Diagnosis not present

## 2023-03-14 DIAGNOSIS — J449 Chronic obstructive pulmonary disease, unspecified: Secondary | ICD-10-CM | POA: Diagnosis not present

## 2023-03-14 DIAGNOSIS — J439 Emphysema, unspecified: Secondary | ICD-10-CM | POA: Diagnosis not present

## 2023-03-14 DIAGNOSIS — H9190 Unspecified hearing loss, unspecified ear: Secondary | ICD-10-CM | POA: Diagnosis present

## 2023-03-14 DIAGNOSIS — Z01818 Encounter for other preprocedural examination: Secondary | ICD-10-CM | POA: Diagnosis not present

## 2023-03-14 DIAGNOSIS — J95812 Postprocedural air leak: Secondary | ICD-10-CM | POA: Diagnosis not present

## 2023-03-14 DIAGNOSIS — Z79899 Other long term (current) drug therapy: Secondary | ICD-10-CM

## 2023-03-14 DIAGNOSIS — Z8673 Personal history of transient ischemic attack (TIA), and cerebral infarction without residual deficits: Secondary | ICD-10-CM | POA: Diagnosis not present

## 2023-03-14 DIAGNOSIS — D72829 Elevated white blood cell count, unspecified: Secondary | ICD-10-CM | POA: Diagnosis not present

## 2023-03-14 DIAGNOSIS — Z87891 Personal history of nicotine dependence: Secondary | ICD-10-CM | POA: Diagnosis not present

## 2023-03-14 DIAGNOSIS — Z8551 Personal history of malignant neoplasm of bladder: Secondary | ICD-10-CM

## 2023-03-14 DIAGNOSIS — Z1152 Encounter for screening for COVID-19: Secondary | ICD-10-CM | POA: Diagnosis not present

## 2023-03-14 DIAGNOSIS — J939 Pneumothorax, unspecified: Secondary | ICD-10-CM | POA: Diagnosis not present

## 2023-03-14 DIAGNOSIS — J95811 Postprocedural pneumothorax: Secondary | ICD-10-CM | POA: Diagnosis not present

## 2023-03-14 DIAGNOSIS — Z4682 Encounter for fitting and adjustment of non-vascular catheter: Secondary | ICD-10-CM | POA: Diagnosis not present

## 2023-03-14 DIAGNOSIS — J8410 Pulmonary fibrosis, unspecified: Secondary | ICD-10-CM | POA: Diagnosis not present

## 2023-03-14 DIAGNOSIS — C3492 Malignant neoplasm of unspecified part of left bronchus or lung: Secondary | ICD-10-CM | POA: Diagnosis not present

## 2023-03-14 HISTORY — PX: NODE DISSECTION: SHX5269

## 2023-03-14 HISTORY — PX: INTERCOSTAL NERVE BLOCK: SHX5021

## 2023-03-14 LAB — ABO/RH: ABO/RH(D): A POS

## 2023-03-14 SURGERY — WEDGE RESECTION, LUNG, ROBOT-ASSISTED, THORACOSCOPIC
Anesthesia: General | Site: Chest | Laterality: Left

## 2023-03-14 MED ORDER — LACTATED RINGERS IV SOLN: INTRAVENOUS | Status: DC

## 2023-03-14 MED ORDER — ONDANSETRON HCL 4 MG/2ML IJ SOLN
INTRAMUSCULAR | Status: DC | PRN
  Administered 2023-03-14: 4 mg via INTRAVENOUS

## 2023-03-14 MED ORDER — ACETAMINOPHEN 160 MG/5ML PO SOLN: 1000.0000 mg | Freq: Four times a day (QID) | ORAL | Status: DC

## 2023-03-14 MED ORDER — CYCLOBENZAPRINE HCL 10 MG PO TABS: 10.0000 mg | ORAL_TABLET | Freq: Three times a day (TID) | ORAL | Status: DC | PRN

## 2023-03-14 MED ORDER — BUPIVACAINE LIPOSOME 1.3 % IJ SUSP
INTRAMUSCULAR | Status: AC
  Filled 2023-03-14: qty 20

## 2023-03-14 MED ORDER — 0.9 % SODIUM CHLORIDE (POUR BTL) OPTIME
TOPICAL | Status: DC | PRN
  Administered 2023-03-14: 2000 mL

## 2023-03-14 MED ORDER — SENNOSIDES-DOCUSATE SODIUM 8.6-50 MG PO TABS
1.0000 | ORAL_TABLET | Freq: Every day | ORAL | Status: DC
  Administered 2023-03-15 – 2023-03-16 (×2): 1 via ORAL
  Filled 2023-03-14 (×2): qty 1

## 2023-03-14 MED ORDER — LIDOCAINE 2% (20 MG/ML) 5 ML SYRINGE
INTRAMUSCULAR | Status: DC | PRN
  Administered 2023-03-14: 60 mg via INTRAVENOUS

## 2023-03-14 MED ORDER — MIDAZOLAM HCL 2 MG/2ML IJ SOLN
INTRAMUSCULAR | Status: AC
  Filled 2023-03-14: qty 2

## 2023-03-14 MED ORDER — ACETAMINOPHEN 10 MG/ML IV SOLN
1000.0000 mg | Freq: Once | INTRAVENOUS | Status: DC | PRN
  Administered 2023-03-14: 1000 mg via INTRAVENOUS

## 2023-03-14 MED ORDER — PROPOFOL 10 MG/ML IV BOLUS
INTRAVENOUS | Status: AC
  Filled 2023-03-14: qty 20

## 2023-03-14 MED ORDER — ENOXAPARIN SODIUM 40 MG/0.4ML IJ SOSY
40.0000 mg | PREFILLED_SYRINGE | Freq: Every day | INTRAMUSCULAR | Status: DC
  Administered 2023-03-15 – 2023-03-17 (×3): 40 mg via SUBCUTANEOUS
  Filled 2023-03-14 (×3): qty 0.4

## 2023-03-14 MED ORDER — OXYCODONE HCL 5 MG/5ML PO SOLN: 5.0000 mg | Freq: Once | ORAL | Status: DC | PRN

## 2023-03-14 MED ORDER — ROCURONIUM BROMIDE 10 MG/ML (PF) SYRINGE
PREFILLED_SYRINGE | INTRAVENOUS | Status: DC | PRN
  Administered 2023-03-14: 100 mg via INTRAVENOUS
  Administered 2023-03-14 (×2): 20 mg via INTRAVENOUS

## 2023-03-14 MED ORDER — KETAMINE HCL 50 MG/5ML IJ SOSY
PREFILLED_SYRINGE | INTRAMUSCULAR | Status: AC
  Filled 2023-03-14: qty 5

## 2023-03-14 MED ORDER — ACETAMINOPHEN 10 MG/ML IV SOLN
INTRAVENOUS | Status: AC
  Filled 2023-03-14: qty 100

## 2023-03-14 MED ORDER — ROCURONIUM BROMIDE 10 MG/ML (PF) SYRINGE
PREFILLED_SYRINGE | INTRAVENOUS | Status: AC
  Filled 2023-03-14: qty 10

## 2023-03-14 MED ORDER — EPHEDRINE 5 MG/ML INJ
INTRAVENOUS | Status: AC
  Filled 2023-03-14: qty 5

## 2023-03-14 MED ORDER — PHENYLEPHRINE 80 MCG/ML (10ML) SYRINGE FOR IV PUSH (FOR BLOOD PRESSURE SUPPORT)
PREFILLED_SYRINGE | INTRAVENOUS | Status: DC | PRN
  Administered 2023-03-14 (×2): 80 ug via INTRAVENOUS

## 2023-03-14 MED ORDER — ACETAMINOPHEN 500 MG PO TABS
1000.0000 mg | ORAL_TABLET | Freq: Four times a day (QID) | ORAL | Status: DC
  Administered 2023-03-14 – 2023-03-17 (×8): 1000 mg via ORAL
  Filled 2023-03-14 (×8): qty 2

## 2023-03-14 MED ORDER — GABAPENTIN 300 MG PO CAPS
300.0000 mg | ORAL_CAPSULE | Freq: Three times a day (TID) | ORAL | Status: DC
  Administered 2023-03-14 – 2023-03-17 (×8): 300 mg via ORAL
  Filled 2023-03-14 (×8): qty 1

## 2023-03-14 MED ORDER — PHENYLEPHRINE HCL-NACL 20-0.9 MG/250ML-% IV SOLN
INTRAVENOUS | Status: DC | PRN
  Administered 2023-03-14: 20 ug/min via INTRAVENOUS

## 2023-03-14 MED ORDER — ATORVASTATIN CALCIUM 10 MG PO TABS
20.0000 mg | ORAL_TABLET | Freq: Every day | ORAL | Status: DC
  Administered 2023-03-15 – 2023-03-17 (×3): 20 mg via ORAL
  Filled 2023-03-14 (×3): qty 2

## 2023-03-14 MED ORDER — ONDANSETRON HCL 4 MG/2ML IJ SOLN
INTRAMUSCULAR | Status: AC
  Filled 2023-03-14: qty 2

## 2023-03-14 MED ORDER — FENTANYL CITRATE (PF) 100 MCG/2ML IJ SOLN
25.0000 ug | INTRAMUSCULAR | Status: DC | PRN
  Administered 2023-03-14: 50 ug via INTRAVENOUS

## 2023-03-14 MED ORDER — FENTANYL CITRATE (PF) 250 MCG/5ML IJ SOLN
INTRAMUSCULAR | Status: DC | PRN
  Administered 2023-03-14: 250 ug via INTRAVENOUS

## 2023-03-14 MED ORDER — DEXAMETHASONE SODIUM PHOSPHATE 10 MG/ML IJ SOLN
INTRAMUSCULAR | Status: AC
  Filled 2023-03-14: qty 1

## 2023-03-14 MED ORDER — CHLORHEXIDINE GLUCONATE 0.12 % MT SOLN
15.0000 mL | Freq: Once | OROMUCOSAL | Status: AC
  Administered 2023-03-14: 15 mL via OROMUCOSAL
  Filled 2023-03-14: qty 15

## 2023-03-14 MED ORDER — FENTANYL CITRATE (PF) 250 MCG/5ML IJ SOLN
INTRAMUSCULAR | Status: AC
  Filled 2023-03-14: qty 5

## 2023-03-14 MED ORDER — FLUTICASONE FUROATE-VILANTEROL 100-25 MCG/ACT IN AEPB
1.0000 | INHALATION_SPRAY | Freq: Every day | RESPIRATORY_TRACT | Status: DC
  Administered 2023-03-15 – 2023-03-17 (×3): 1 via RESPIRATORY_TRACT
  Filled 2023-03-14: qty 28

## 2023-03-14 MED ORDER — DEXAMETHASONE SODIUM PHOSPHATE 10 MG/ML IJ SOLN
INTRAMUSCULAR | Status: DC | PRN
  Administered 2023-03-14: 10 mg via INTRAVENOUS

## 2023-03-14 MED ORDER — DIAZEPAM 2 MG PO TABS: 10.0000 mg | ORAL_TABLET | Freq: Three times a day (TID) | ORAL | Status: DC | PRN

## 2023-03-14 MED ORDER — ALBUTEROL SULFATE (2.5 MG/3ML) 0.083% IN NEBU: 2.5000 mg | INHALATION_SOLUTION | Freq: Four times a day (QID) | RESPIRATORY_TRACT | Status: DC | PRN

## 2023-03-14 MED ORDER — BUPIVACAINE HCL (PF) 0.5 % IJ SOLN
INTRAMUSCULAR | Status: AC
  Filled 2023-03-14: qty 30

## 2023-03-14 MED ORDER — OXYCODONE HCL 5 MG PO TABS
15.0000 mg | ORAL_TABLET | Freq: Every day | ORAL | Status: DC
  Administered 2023-03-14 – 2023-03-17 (×9): 15 mg via ORAL
  Filled 2023-03-14 (×10): qty 3

## 2023-03-14 MED ORDER — ORAL CARE MOUTH RINSE: 15.0000 mL | Freq: Once | OROMUCOSAL | Status: AC

## 2023-03-14 MED ORDER — BISACODYL 5 MG PO TBEC
10.0000 mg | DELAYED_RELEASE_TABLET | Freq: Every day | ORAL | Status: DC
  Administered 2023-03-15 – 2023-03-16 (×2): 10 mg via ORAL
  Filled 2023-03-14 (×3): qty 2

## 2023-03-14 MED ORDER — OXYCODONE HCL 5 MG PO TABS: 5.0000 mg | ORAL_TABLET | Freq: Once | ORAL | Status: DC | PRN

## 2023-03-14 MED ORDER — SUGAMMADEX SODIUM 200 MG/2ML IV SOLN
INTRAVENOUS | Status: DC | PRN
  Administered 2023-03-14: 200 mg via INTRAVENOUS

## 2023-03-14 MED ORDER — CEFAZOLIN SODIUM-DEXTROSE 2-4 GM/100ML-% IV SOLN
2.0000 g | Freq: Three times a day (TID) | INTRAVENOUS | Status: AC
  Administered 2023-03-15 (×2): 2 g via INTRAVENOUS
  Filled 2023-03-14 (×2): qty 100

## 2023-03-14 MED ORDER — PROPOFOL 10 MG/ML IV BOLUS
INTRAVENOUS | Status: DC | PRN
  Administered 2023-03-14: 100 mg via INTRAVENOUS

## 2023-03-14 MED ORDER — PANTOPRAZOLE SODIUM 40 MG PO TBEC
40.0000 mg | DELAYED_RELEASE_TABLET | Freq: Every day | ORAL | Status: DC
  Administered 2023-03-15 – 2023-03-17 (×3): 40 mg via ORAL
  Filled 2023-03-14 (×3): qty 1

## 2023-03-14 MED ORDER — GLYCOPYRROLATE PF 0.2 MG/ML IJ SOSY
PREFILLED_SYRINGE | INTRAMUSCULAR | Status: AC
  Filled 2023-03-14: qty 1

## 2023-03-14 MED ORDER — NABUMETONE 500 MG PO TABS
500.0000 mg | ORAL_TABLET | Freq: Three times a day (TID) | ORAL | Status: DC
  Administered 2023-03-14 – 2023-03-17 (×8): 500 mg via ORAL
  Filled 2023-03-14 (×8): qty 1

## 2023-03-14 MED ORDER — DROPERIDOL 2.5 MG/ML IJ SOLN: 0.6250 mg | Freq: Once | INTRAMUSCULAR | Status: DC | PRN

## 2023-03-14 MED ORDER — KETAMINE HCL 10 MG/ML IJ SOLN
INTRAMUSCULAR | Status: DC | PRN
Start: 2023-03-14 — End: 2023-03-14
  Administered 2023-03-14 (×2): 20 mg via INTRAVENOUS

## 2023-03-14 MED ORDER — FENTANYL CITRATE PF 50 MCG/ML IJ SOSY: 25.0000 ug | PREFILLED_SYRINGE | INTRAMUSCULAR | Status: DC | PRN

## 2023-03-14 MED ORDER — UMECLIDINIUM BROMIDE 62.5 MCG/ACT IN AEPB
1.0000 | INHALATION_SPRAY | Freq: Every day | RESPIRATORY_TRACT | Status: DC
  Administered 2023-03-15 – 2023-03-17 (×3): 1 via RESPIRATORY_TRACT
  Filled 2023-03-14: qty 7

## 2023-03-14 MED ORDER — CEFAZOLIN SODIUM-DEXTROSE 2-4 GM/100ML-% IV SOLN
2.0000 g | INTRAVENOUS | Status: AC
  Administered 2023-03-14 (×2): 2 g via INTRAVENOUS
  Filled 2023-03-14: qty 100

## 2023-03-14 MED ORDER — ONDANSETRON HCL 4 MG/2ML IJ SOLN: 4.0000 mg | Freq: Four times a day (QID) | INTRAMUSCULAR | Status: DC | PRN

## 2023-03-14 MED ORDER — GLYCOPYRROLATE 0.2 MG/ML IJ SOLN
INTRAMUSCULAR | Status: DC | PRN
Start: 2023-03-14 — End: 2023-03-14
  Administered 2023-03-14: .1 mg via INTRAVENOUS

## 2023-03-14 MED ORDER — PHENYLEPHRINE 80 MCG/ML (10ML) SYRINGE FOR IV PUSH (FOR BLOOD PRESSURE SUPPORT)
PREFILLED_SYRINGE | INTRAVENOUS | Status: AC
  Filled 2023-03-14: qty 10

## 2023-03-14 MED ORDER — SODIUM CHLORIDE FLUSH 0.9 % IV SOLN
INTRAVENOUS | Status: DC | PRN
  Administered 2023-03-14: 100 mL

## 2023-03-14 MED ORDER — LACTATED RINGERS IV SOLN
INTRAVENOUS | Status: DC | PRN
Start: 2023-03-14 — End: 2023-03-14

## 2023-03-14 MED ORDER — FENTANYL CITRATE (PF) 100 MCG/2ML IJ SOLN
INTRAMUSCULAR | Status: AC
  Filled 2023-03-14: qty 2

## 2023-03-14 MED ORDER — EPHEDRINE SULFATE-NACL 50-0.9 MG/10ML-% IV SOSY
PREFILLED_SYRINGE | INTRAVENOUS | Status: DC | PRN
  Administered 2023-03-14: 5 mg via INTRAVENOUS

## 2023-03-14 SURGICAL SUPPLY — 85 items
ADH SKN CLS APL DERMABOND .7 (GAUZE/BANDAGES/DRESSINGS) ×1
APL PRP STRL LF DISP 70% ISPRP (MISCELLANEOUS) ×1
BAG SPEC RTRVL C125 8X14 (MISCELLANEOUS) ×1
BAG SPEC RTRVL C1550 15 (MISCELLANEOUS) ×1
CANISTER SUCT 3000ML PPV (MISCELLANEOUS) ×2 IMPLANT
CANNULA REDUCER 12-8 DVNC XI (CANNULA) ×2 IMPLANT
CATH THORACIC 28FR (CATHETERS) ×1 IMPLANT
CAUTERY SPATULA MNPLR 1.7 DVNC (INSTRUMENTS) IMPLANT
CHLORAPREP W/TINT 26 (MISCELLANEOUS) ×1 IMPLANT
CNTNR URN SCR LID CUP LEK RST (MISCELLANEOUS) ×5 IMPLANT
CONT SPEC 4OZ STRL OR WHT (MISCELLANEOUS) ×9
DEFOGGER SCOPE WARMER CLEARIFY (MISCELLANEOUS) ×1 IMPLANT
DERMABOND ADVANCED .7 DNX12 (GAUZE/BANDAGES/DRESSINGS) ×1 IMPLANT
DRAPE ARM DVNC X/XI (DISPOSABLE) ×4 IMPLANT
DRAPE COLUMN DVNC XI (DISPOSABLE) ×1 IMPLANT
DRAPE CV SPLIT W-CLR ANES SCRN (DRAPES) ×1 IMPLANT
DRAPE HALF SHEET 40X57 (DRAPES) ×1 IMPLANT
DRAPE ORTHO SPLIT 77X108 STRL (DRAPES) ×1
DRAPE SURG ORHT 6 SPLT 77X108 (DRAPES) ×1 IMPLANT
ELECT BLADE 6.5 EXT (BLADE) IMPLANT
ELECT REM PT RETURN 9FT ADLT (ELECTROSURGICAL) ×1
ELECTRODE REM PT RTRN 9FT ADLT (ELECTROSURGICAL) ×1 IMPLANT
FORCEPS BPLR LNG DVNC XI (INSTRUMENTS) IMPLANT
FORCEPS CADIERE DVNC XI (FORCEP) IMPLANT
GAUZE KITTNER 4X5 RF (MISCELLANEOUS) ×1 IMPLANT
GAUZE SPONGE 4X4 12PLY STRL (GAUZE/BANDAGES/DRESSINGS) ×1 IMPLANT
GLOVE BIO SURGEON STRL SZ7.5 (GLOVE) ×2 IMPLANT
GLOVE SURG SS PI 8.0 STRL IVOR (GLOVE) ×1 IMPLANT
GOWN STRL REUS W/ TWL LRG LVL3 (GOWN DISPOSABLE) ×2 IMPLANT
GOWN STRL REUS W/ TWL XL LVL3 (GOWN DISPOSABLE) ×2 IMPLANT
GOWN STRL REUS W/TWL 2XL LVL3 (GOWN DISPOSABLE) ×1 IMPLANT
GOWN STRL REUS W/TWL LRG LVL3 (GOWN DISPOSABLE) ×2
GOWN STRL REUS W/TWL XL LVL3 (GOWN DISPOSABLE) ×2
GRASPER TIP-UP FEN DVNC XI (INSTRUMENTS) IMPLANT
HEMOSTAT SURGICEL 2X14 (HEMOSTASIS) ×3 IMPLANT
KIT BASIN OR (CUSTOM PROCEDURE TRAY) ×1 IMPLANT
KIT TURNOVER KIT B (KITS) ×1 IMPLANT
NDL 22X1.5 STRL (OR ONLY) (MISCELLANEOUS) ×1 IMPLANT
NEEDLE 22X1.5 STRL (OR ONLY) (MISCELLANEOUS) ×1
NS IRRIG 1000ML POUR BTL (IV SOLUTION) ×3 IMPLANT
PACK CHEST (CUSTOM PROCEDURE TRAY) ×1 IMPLANT
PAD ARMBOARD 7.5X6 YLW CONV (MISCELLANEOUS) ×5 IMPLANT
PORT ACCESS TROCAR AIRSEAL 12 (TROCAR) ×1 IMPLANT
RELOAD STAPLE 45 2.0 GRY DVNC (STAPLE) IMPLANT
RELOAD STAPLE 45 2.5 WHT DVNC (STAPLE) IMPLANT
RELOAD STAPLE 45 3.5 BLU DVNC (STAPLE) IMPLANT
RELOAD STAPLE 45 4.3 GRN DVNC (STAPLE) IMPLANT
RELOAD STAPLE 45 4.6 BLK DVNC (STAPLE) IMPLANT
SEAL UNIV 5-12 XI (MISCELLANEOUS) ×4 IMPLANT
SET TRI-LUMEN FLTR TB AIRSEAL (TUBING) ×1 IMPLANT
SOL ELECTROSURG ANTI STICK (MISCELLANEOUS) ×1
SOLUTION ELECTROSURG ANTI STCK (MISCELLANEOUS) ×1 IMPLANT
STAPLE RELOAD 45 2.0 GRAY DVNC (STAPLE) ×5
STAPLER 45 SUREFORM CVD DVNC (STAPLE) IMPLANT
STAPLER RELOAD 2.5X45 WHT DVNC (STAPLE) ×4
STAPLER RELOAD 3.5X45 BLU DVNC (STAPLE) ×8
STAPLER RELOAD 4.3X45 GRN DVNC (STAPLE) ×2
STAPLER RELOAD 45 4.6 BLK DVNC (STAPLE) ×1
STOPCOCK 4 WAY LG BORE MALE ST (IV SETS) ×1 IMPLANT
SUT PDS AB 1 CTX 36 (SUTURE) IMPLANT
SUT PROLENE 4 0 RB 1 (SUTURE)
SUT PROLENE 4-0 RB1 .5 CRCL 36 (SUTURE) IMPLANT
SUT SILK 1 MH (SUTURE) ×1 IMPLANT
SUT SILK 2 0 SH (SUTURE) IMPLANT
SUT SILK 2 0SH CR/8 30 (SUTURE) IMPLANT
SUT VIC AB 1 CTX 36 (SUTURE)
SUT VIC AB 1 CTX36XBRD ANBCTR (SUTURE) IMPLANT
SUT VIC AB 2-0 CT1 27 (SUTURE) ×1
SUT VIC AB 2-0 CT1 TAPERPNT 27 (SUTURE) ×1 IMPLANT
SUT VIC AB 3-0 SH 27 (SUTURE) ×2
SUT VIC AB 3-0 SH 27X BRD (SUTURE) ×3 IMPLANT
SUT VICRYL 0 TIES 12 18 (SUTURE) ×1 IMPLANT
SUT VICRYL 0 UR6 27IN ABS (SUTURE) ×2 IMPLANT
SYR 10ML LL (SYRINGE) ×1 IMPLANT
SYR 20ML LL LF (SYRINGE) ×1 IMPLANT
SYR 50ML LL SCALE MARK (SYRINGE) ×1 IMPLANT
SYSTEM RETRIEVAL ANCHOR 15 (MISCELLANEOUS) IMPLANT
SYSTEM RETRIEVAL ANCHOR 8 (MISCELLANEOUS) IMPLANT
SYSTEM SAHARA CHEST DRAIN ATS (WOUND CARE) ×1 IMPLANT
TAPE CLOTH 4X10 WHT NS (GAUZE/BANDAGES/DRESSINGS) ×1 IMPLANT
TOWEL GREEN STERILE (TOWEL DISPOSABLE) ×1 IMPLANT
TRAY FOLEY MTR SLVR 16FR STAT (SET/KITS/TRAYS/PACK) ×1 IMPLANT
TRAY FOLEY W/BAG SLVR 14FR (SET/KITS/TRAYS/PACK) ×1 IMPLANT
TUBING EXTENTION W/L.L. (IV SETS) ×1 IMPLANT
WATER STERILE IRR 1000ML POUR (IV SOLUTION) ×1 IMPLANT

## 2023-03-14 NOTE — Op Note (Signed)
      [address]       Joshua [name] [address]             [phone]        03/14/2023  Patient:  Joshua [name] Pre-Op Dx: Left upper lobe pulmonary nodule   Post-op Dx:  Left upper lobe NSCLC Procedure: - Robotic assisted left video thoracoscopy - Lysis of adhesions - Left upper lobe wedge resection - left upper  lobectomy - Mediastinal lymph node sampling - Intercostal nerve block  Surgeon and Role:      * Joshua [name], Joshua Lofts, MD - Primary  Assistant: B. Stehler, PA-C  An experienced assistant was required given the complexity of this surgery and the standard of surgical care. The assistant was needed for exposure, dissection, suctioning, retraction of delicate tissues and sutures, instrument exchange and for overall help during this procedure.    Anesthesia  general EBL:  100 ml Blood Administration: none Specimen:  left upper, hilar and mediastinal nodes  Drains: 28 F argyle chest tube in left chest Counts: correct   Indications: 72yo male with left upper lobe pulmonary nodule concerning for primary lung cancer. 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 We discussed the risks and benefits of a L RATS, wedge resection, possible lobectomy. He is agreeable to proceed.   Findings: The upper lobe nodule was densely adherent to the chest wall.  Multiple segmental branches to the upper lobe.  Dense adhesions around the bronchus  Operative Technique: After the risks, benefits and alternatives were thoroughly discussed, the patient was brought to the operative theatre.  Anesthesia was induced, and the patient was then placed in a lateral decubitus position and was prepped and draped in normal sterile fashion.  An appropriate surgical pause was performed, and pre-operative antibiotics were dosed accordingly.  We began by placing our 4 robotic ports in the the 7th intercostal space targeting the hilum of  the lung.  A 12mm assistant port was placed in the 9th intercostal space in the anterior axillary line.  The robot was then docked and all instruments were passed under direct visualization.    Apical adhesions were lysed, and the nodule was wedge out.  The specimen was consistent with NSCLC.  The lung was then retracted superiorly, and the inferior pulmonary ligament was divided.  The hilum was mobilized anteriorly and posteriorly.  We identified the upper lobe pulmonary vein, and after careful isolation, it was divided with a vascular stapler.  We next moved to the pulmonary artery.  The artery was then divided with a vascular load stapler.  The bronchus to the upper lobe was then isolated.  After a test clamp, with good ventilation of the remaining lung, the bronchus was then divided.  The fissure was completed, and the specimen was passed into an endocatch bag.  It was removed from the anterior access site.    Lymph nodes were then sampled at hilum and mediastinum.  The chest was irrigated, and an air leak test was performed.  An intercostal nerve block was performed under direct visualization.  A 28 F chest tube was then placed, and we watch the remaining lobes re-expand.  The skin and soft tissue were closed with absorbable suture    The patient tolerated the procedure without any immediate complications, and was transferred to the PACU in stable condition.  Joshua [name]

## 2023-03-14 NOTE — Anesthesia Preprocedure Evaluation (Signed)
Anesthesia Evaluation  Patient identified by MRN, date of birth, ID band Patient awake    Reviewed: Allergy & Precautions, H&P , NPO status , Patient's Chart, lab work & pertinent test results  History of Anesthesia Complications (+) PONV and history of anesthetic complications  Airway Mallampati: II  TM Distance: >3 FB Neck ROM: Full    Dental no notable dental hx.    Pulmonary COPD, former smoker Pulmonary nodule   Pulmonary exam normal breath sounds clear to auscultation       Cardiovascular negative cardio ROS Normal cardiovascular exam Rhythm:Regular Rate:Normal     Neuro/Psych Hx of meniere's disease CVA  negative psych ROS   GI/Hepatic negative GI ROS, Neg liver ROS,,,  Endo/Other  negative endocrine ROS    Renal/GU negative Renal ROS  negative genitourinary   Musculoskeletal  (+) Arthritis ,    Abdominal   Peds negative pediatric ROS (+)  Hematology negative hematology ROS (+)   Anesthesia Other Findings   Reproductive/Obstetrics negative OB ROS                             Anesthesia Physical Anesthesia Plan  ASA: 3  Anesthesia Plan: General   Post-op Pain Management:    Induction: Intravenous  PONV Risk Score and Plan: Ondansetron and Dexamethasone  Airway Management Planned: Double Lumen EBT  Additional Equipment: ClearSight  Intra-op Plan:   Post-operative Plan: Extubation in OR  Informed Consent: I have reviewed the patients History and Physical, chart, labs and discussed the procedure including the risks, benefits and alternatives for the proposed anesthesia with the patient or authorized representative who has indicated his/her understanding and acceptance.     Dental advisory given  Plan Discussed with: CRNA  Anesthesia Plan Comments:        Anesthesia Quick Evaluation

## 2023-03-14 NOTE — Interval H&P Note (Signed)
History and Physical Interval Note:  03/14/2023 1:35 PM  Joshua [name]  has presented today for surgery, with the diagnosis of LUNG NODULE.  The various methods of treatment have been discussed with the patient and family. After consideration of risks, benefits and other options for treatment, the patient has consented to  Procedure(s): XI ROBOTIC ASSISTED THORACOSCOPY-LEFT UPPER LOBE WEDGE RESECTION, possible lobectomy (Left) as a surgical intervention.  The patient's history has been reviewed, patient examined, no change in status, stable for surgery.  I have reviewed the patient's chart and labs.  Questions were answered to the patient's satisfaction.     Shakisha Abend Keane Scrape

## 2023-03-14 NOTE — Hospital Course (Addendum)
History of Present Illness:    MUKESH KORNEGAY 72 y.o. male presents for surgical evaluation of a left upper lobe pulmonary nodule.  He has a past medical history of bladder, COPD, stroke 30 years ago.Patient has a history of tobacco use. Was referred for evaluation of pulmonary nodule. 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 Lung nodule was first seen on follow-up imaging in July 2024 for lung cancer screening. Ultimately had a nuclear medicine PET scan complete in August 2024. Small nodule in the left lung concerning for a stage 1 lung cancer.   Dr. Cliffton Asters reviewed the patient's diagnostic studies and determined he would benefit from surgical intervention. He reviewed the treatment options as well as the risks and benefits of surgery with the patient. Mr. Tomkins was agreeable to proceed with surgery.  Hospital Course:  Mr. Kolton presented to [facility] and was brought to the operating room on 10/02. He underwent robotic assisted left upper lobectomy. He tolerated the procedure well, was extubated and transferred to the PACU in stable condition. CXR was without a pneumothorax but he had an air leak with cough on POD1. Chest tube was left in place to water seal. He had some pain, he was restarted on his home Oxycodone as well as schedule Tylenol and Fentanyl PRN. Creatinine was slightly elevated at 1.29 but trended down to 1.14. He had a trace left apical pneumothorax on POD2 and small air leak. Chest tube was clamped and follow up CXR showed a stable trace left apical pneumothorax. He was ambulating well on room air and his incisions were healing well without sign of infection. He was felt stable for discharge.

## 2023-03-14 NOTE — Anesthesia Postprocedure Evaluation (Signed)
Anesthesia Post Note  Patient: Joshua [name]  Procedure(s) Performed: XI ROBOTIC ASSISTED THORACOSCOPY-LEFT UPPER LOBE WEDGE RESECTION, LEFT UPPER LOBECTOMY (Left: Chest) NODE DISSECTION (Left: Chest) INTERCOSTAL NERVE BLOCK (Left: Chest)     Patient location during evaluation: PACU Anesthesia Type: General Level of consciousness: awake and alert Pain management: pain level controlled Vital Signs Assessment: post-procedure vital signs reviewed and stable Respiratory status: spontaneous breathing, nonlabored ventilation, respiratory function stable and patient connected to nasal cannula oxygen Cardiovascular status: stable and blood pressure returned to baseline Anesthetic complications: no   No notable events documented.  Last Vitals:  Vitals:   03/14/23 1900 03/14/23 1915  BP: 124/72 123/75  Pulse: 66 65  Resp: 14 16  Temp:    SpO2: 96% 94%    Last Pain:  Vitals:   03/14/23 1915  TempSrc:   PainSc: Asleep                 Beryle Lathe

## 2023-03-14 NOTE — Anesthesia Procedure Notes (Addendum)
Procedure Name: Intubation Date/Time: 03/14/2023 2:40 PM  Performed by: Camillia Herter, CRNAPre-anesthesia Checklist: Patient identified, Emergency Drugs available, Suction available and Patient being monitored Patient Re-evaluated:Patient Re-evaluated prior to induction Oxygen Delivery Method: Circle System Utilized Preoxygenation: Pre-oxygenation with 100% oxygen Induction Type: IV induction Ventilation: Mask ventilation without difficulty Laryngoscope Size: Mac and 4 Grade View: Grade I Tube type: Oral Endobronchial tube: Double lumen EBT, EBT position confirmed by fiberoptic bronchoscope and Left and 39 Fr Number of attempts: 1 Airway Equipment and Method: Stylet and Oral airway Placement Confirmation: ETT inserted through vocal cords under direct vision, positive ETCO2 and breath sounds checked- equal and bilateral Secured at: 31 cm Tube secured with: Tape Dental Injury: Teeth and Oropharynx as per pre-operative assessment

## 2023-03-14 NOTE — Transfer of Care (Signed)
Immediate Anesthesia Transfer of Care Note  Patient: Joshua [name]  Procedure(s) Performed: XI ROBOTIC ASSISTED THORACOSCOPY-LEFT UPPER LOBE WEDGE RESECTION, LEFT UPPER LOBECTOMY (Left: Chest) NODE DISSECTION (Left: Chest) INTERCOSTAL NERVE BLOCK (Left: Chest)  Patient Location: PACU  Anesthesia Type:General  Level of Consciousness: awake  Airway & Oxygen Therapy: Patient Spontanous Breathing and Patient connected to face mask  Post-op Assessment: Report given to RN and Post -op Vital signs reviewed and stable  Post vital signs: Reviewed and stable  Last Vitals:  Vitals Value Taken Time  BP 137/72 03/14/23 1847  Temp    Pulse 61 03/14/23 1851  Resp 11 03/14/23 1851  SpO2 100 % 03/14/23 1851  Vitals shown include unfiled device data.  Last Pain:  Vitals:   03/14/23 1052  TempSrc: Oral      Patients Stated Pain Goal: 0 (03/14/23 1107)  Complications: No notable events documented.

## 2023-03-14 NOTE — Brief Op Note (Signed)
03/14/2023  6:30 PM  PATIENT:  Joshua [name]  72 y.o. male  PRE-OPERATIVE DIAGNOSIS:  LUNG NODULE  POST-OPERATIVE DIAGNOSIS:  LUNG NODULE  PROCEDURE:  XI ROBOTIC ASSISTED THORACOSCOPY-LEFT UPPER LOBE WEDGE RESECTION, LEFT UPPER LOBECTOMY (Left) NODE DISSECTION (Left) INTERCOSTAL NERVE BLOCK (Left)  SURGEON:  Surgeons and Role:    * Lightfoot, Eliezer Lofts, MD - Primary  PHYSICIAN ASSISTANT: Aloha Gell PA-C, Erin Barrett PA-C  ASSISTANTS: none   ANESTHESIA:   local and general  EBL:  Minimal  BLOOD ADMINISTERED:none  DRAINS:  left pleural drain    LOCAL MEDICATIONS USED:  OTHER exparel  SPECIMEN:  Source of Specimen:  LUL, LUL wedge, lymph nodes  DISPOSITION OF SPECIMEN:  PATHOLOGY  COUNTS:  YES  DICTATION: .Dragon Dictation  PLAN OF CARE: Admit to inpatient   PATIENT DISPOSITION:  PACU - hemodynamically stable.   Delay start of Pharmacological VTE agent (>24hrs) due to surgical blood loss or risk of bleeding: no

## 2023-03-15 ENCOUNTER — Inpatient Hospital Stay (HOSPITAL_COMMUNITY): Payer: Medicare Other

## 2023-03-15 ENCOUNTER — Encounter (HOSPITAL_COMMUNITY): Payer: Self-pay | Admitting: Thoracic Surgery (Cardiothoracic Vascular Surgery)

## 2023-03-15 LAB — CBC
HCT: 33.8 % — ABNORMAL LOW (ref 39.0–52.0)
Hemoglobin: 11 g/dL — ABNORMAL LOW (ref 13.0–17.0)
MCH: 29.1 pg (ref 26.0–34.0)
MCHC: 32.5 g/dL (ref 30.0–36.0)
MCV: 89.4 fL (ref 80.0–100.0)
Platelets: 220 10*3/uL (ref 150–400)
RBC: 3.78 MIL/uL — ABNORMAL LOW (ref 4.22–5.81)
RDW: 14 % (ref 11.5–15.5)
WBC: 13 10*3/uL — ABNORMAL HIGH (ref 4.0–10.5)
nRBC: 0 % (ref 0.0–0.2)

## 2023-03-15 LAB — BASIC METABOLIC PANEL
Anion gap: 13 (ref 5–15)
BUN: 19 mg/dL (ref 8–23)
CO2: 23 mmol/L (ref 22–32)
Calcium: 8.7 mg/dL — ABNORMAL LOW (ref 8.9–10.3)
Chloride: 101 mmol/L (ref 98–111)
Creatinine, Ser: 1.29 mg/dL — ABNORMAL HIGH (ref 0.61–1.24)
GFR, Estimated: 60 mL/min — ABNORMAL LOW (ref >60)
Glucose, Bld: 169 mg/dL — ABNORMAL HIGH (ref 70–99)
Potassium: 4.5 mmol/L (ref 3.5–5.1)
Sodium: 137 mmol/L (ref 135–145)

## 2023-03-15 MED ORDER — IPRATROPIUM-ALBUTEROL 0.5-2.5 (3) MG/3ML IN SOLN
3.0000 mL | Freq: Two times a day (BID) | RESPIRATORY_TRACT | Status: DC
  Administered 2023-03-15 – 2023-03-17 (×5): 3 mL via RESPIRATORY_TRACT
  Filled 2023-03-15 (×5): qty 3

## 2023-03-15 NOTE — Discharge Summary (Signed)
Physician Discharge Summary  Patient ID: Joshua [name] MRN: [id number] DOB/AGE: 09/20/1950 72 y.o.  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   Postlaminectomy syndrome, lumbar region 11/13/2012   Thoracic or lumbosacral neuritis or radiculitis, unspecified 11/13/2012   Lumbosacral spondylosis without myelopathy 11/13/2012   Tobacco abuse 11/13/2012   Hereditary and idiopathic peripheral neuropathy 11/13/2012   Discharge Diagnoses:   Patient Active Problem List   Diagnosis Date Noted   Status post partial lobectomy of lung 03/14/2023   S/P lobectomy of lung 03/14/2023   Thoracic ascending aortic aneurysm (HCC) 08/11/2022   Cigarette smoker 03/12/2022   DOE (dyspnea on exertion) 03/09/2022   Intractable nausea and vomiting 08/02/2016   Fever 08/02/2016   Transient hypotension 08/02/2016   Drowsy 10/03/2015   Sinus bradycardia 10/03/2015   COPD GOLD 3 / active smoking     Chronic pain    Polypharmacy    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 male presents for surgical evaluation of a left upper lobe pulmonary nodule.  He has a past medical history of bladder, COPD, stroke 30  years ago.Patient has a history of tobacco use. Was referred for evaluation of pulmonary nodule. 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 Lung nodule was first seen on follow-up imaging in July 2024 for lung cancer screening. Ultimately had a nuclear medicine PET scan complete in August 2024. Small nodule in the left lung concerning for a stage 1 lung cancer.   Dr. Cliffton Asters reviewed the patient's diagnostic studies and determined he would benefit from surgical intervention. He reviewed the treatment options as well as the risks and benefits of surgery with the patient. Joshua [name] was agreeable to proceed with surgery.  Hospital Course:  Joshua [name] presented to [facility] and was brought to the operating room on 10/02. He underwent robotic assisted left upper lobectomy. He tolerated the procedure well, was extubated and transferred to the PACU in stable condition. CXR was without a pneumothorax but he had an air leak with cough on POD1. Chest tube was left in place to water seal. He had some pain, he was restarted on his home Oxycodone as well as schedule Tylenol and Fentanyl PRN. Creatinine was slightly elevated at 1.29 but trended down to 1.14. He had a trace left apical pneumothorax on POD2 and small air leak. Chest tube was clamped and follow up CXR showed a stable trace left apical pneumothorax. He was ambulating well on room air and his incisions were healing well without sign of infection. He was felt stable for discharge.  Consults: None  Significant Diagnostic Studies: CLINICAL DATA:  Initial treatment strategy for lung nodule.   EXAM: NUCLEAR MEDICINE PET  SKULL BASE TO THIGH   TECHNIQUE: 9.7 mCi F-18 FDG was injected intravenously. Full-ring PET imaging was performed from the skull base to thigh after the radiotracer. CT data was obtained and used for attenuation correction and anatomic localization.   Fasting blood glucose: 52 mg/dl   COMPARISON:   40/98/1191, 06/30/2022, 08/02/2016   FINDINGS: Mediastinal blood pool activity: SUV max 2.1   Liver activity: SUV max NA   NECK: No hypermetabolic lymph nodes in the neck.   Incidental CT findings: None   CHEST: Majority of suspicious nodules on comparison exam have resolved in the interval suggesting inflammatory infectious process.   Band nodular consolidation in the medial aspect of the LEFT upper lobe measuring 36 by 13 mm does have intense metabolic activity SUV max equal 8.1 (Image 51). Several small nodules along the RIGHT horizontal fissure have mild metabolic activity. Nodularity Improved from comparison exam (images 71 through 75 series 202 )   No hypermetabolic mediastinal lymph nodes.   Incidental CT findings: Centrilobular emphysema the upper lobes.   ABDOMEN/PELVIS: No abnormal hypermetabolic activity within the liver, pancreas, adrenal glands, or spleen. No hypermetabolic lymph nodes in the abdomen or pelvis.   Incidental CT findings: Atrophic LEFT kidney   Generator pack in the LEFT flank   SKELETON: No focal hypermetabolic activity to suggest skeletal metastasis.   Incidental CT findings: None.   IMPRESSION: 1. Band of hypermetabolic nodular thickening in the medial aspect of the LEFT upper lobe. Differential would include bronchogenic carcinoma versus hypermetabolic inflammatory tissue. Nodularity is new from CT 08/02/2016. Recommend tissue sampling. 2. Bilateral suspicious pulmonary nodules described on most recent CT scan have resolved for the most part. Mild metabolically active nodularity along the horizontal fissure is favored inflammatory. 3. No evidence of metastatic lymphadenopathy or distant metastatic disease.     Electronically Signed   By: Genevive Bi M.D.   On: 01/29/2023 11:11   Treatments: surgery: 03/14/2023   Patient:  Joshua [name] Pre-Op Dx: Left upper lobe pulmonary nodule   Post-op Dx:  Left upper lobe  NSCLC Procedure: - Robotic assisted left video thoracoscopy - Lysis of adhesions - Left upper lobe wedge resection - left upper  lobectomy - Mediastinal lymph node sampling - Intercostal nerve block   Surgeon and Role:      * Lightfoot, Eliezer Lofts, MD - Primary  PATHOLOGY: Pending  Discharge Exam: Blood pressure 105/66, pulse 63, temperature 98.2 F (36.8 C), temperature source Oral, resp. rate 15, height 5\' 8"  (1.727 m), weight 83.9 kg, SpO2 96%.  General appearance: alert, cooperative, and no distress Heart: regular rate and rhythm Lungs: diminished breath sounds left apex, CTA bilaterally Abdomen: soft, non-tender; bowel sounds normal; no masses,  no organomegaly Wound: clean and dry   Discharge disposition: 01-Home or Self Care        Allergies as of 03/17/2023   No Known Allergies      Medication List     TAKE these medications    albuterol 108 (90 Base) MCG/ACT inhaler Commonly known as: VENTOLIN HFA Inhale 2 puffs into the lungs every 6 (six) hours as needed for wheezing or shortness of breath.   atorvastatin 20 MG tablet Commonly known as: LIPITOR Take 20 mg by mouth daily.   cyclobenzaprine 10 MG tablet Commonly known as: FLEXERIL Take 10 mg by mouth 3 (three) times daily as needed for muscle spasms.   diazepam 10 MG tablet Commonly known as: VALIUM Take 10 mg by mouth every 8 (eight)  hours as needed for anxiety or sleep.   gabapentin 300 MG capsule Commonly known as: NEURONTIN Take 300 mg by mouth 3 (three) times daily.   nabumetone 500 MG tablet Commonly known as: RELAFEN Take 500 mg by mouth in the morning, at noon, and at bedtime.   oxyCODONE 15 MG immediate release tablet Commonly known as: ROXICODONE Take 15 mg by mouth 5 (five) times daily. What changed: Another medication with the same name was removed. Continue taking this medication, and follow the directions you see here.   oxymetazoline 0.05 % nasal spray Commonly known as:  AFRIN Place 1 spray into both nostrils 2 (two) times daily.   Trelegy Ellipta 100-62.5-25 MCG/ACT Aepb Generic drug: Fluticasone-Umeclidin-Vilant One click each am        Follow-up Information     Corliss Skains, MD Follow up on 03/23/2023.   Specialty: Cardiothoracic Surgery Why: Follow up appointment is at 11:10AM Contact information: [address] [place] [address] [phone]                 Signed: Lowella Dandy, PA-C  03/17/2023, 10:23 AM

## 2023-03-15 NOTE — Progress Notes (Signed)
Mobility Specialist Progress Note:   03/15/23 1400  Mobility  Activity Ambulated with assistance in hallway  Level of Assistance Contact guard assist, steadying assist  Assistive Device Front wheel walker  Distance Ambulated (ft) 270 ft  Activity Response Tolerated well  Mobility Referral Yes  $Mobility charge 1 Mobility  Mobility Specialist Start Time (ACUTE ONLY) 1426  Mobility Specialist Stop Time (ACUTE ONLY) 1441  Mobility Specialist Time Calculation (min) (ACUTE ONLY) 15 min    During Mobility: 92 HR,  91%  SpO2 Post Mobility:  76 HR,  93% SpO2  Pt received in bed, agreeable to mobility. Asymptomatic throughout w/ no complaints. Pt left in bed with call bell and family present.  Joshua [name] Mobility Specialist Please contact via Special educational needs teacher or Rehab office at [phone]

## 2023-03-15 NOTE — Progress Notes (Addendum)
[address]       [place] [address]             [phone]      1 Day Post-Op Procedure(s) (LRB): XI ROBOTIC ASSISTED THORACOSCOPY-LEFT UPPER LOBE WEDGE RESECTION, LEFT UPPER LOBECTOMY (Left) NODE DISSECTION (Left) INTERCOSTAL NERVE BLOCK (Left) Subjective: The patient complains of pain in his back which is chronic and all over his chest when he takes deep breaths and coughs. He also is asking for breakfast this AM.   Objective: Vital signs in last 24 hours: Temp:  [97.6 F (36.4 C)-98.8 F (37.1 C)] 98.2 F (36.8 C) (10/03 0331) Pulse Rate:  [57-69] 64 (10/03 0331) Cardiac Rhythm: Normal sinus rhythm (10/03 0700) Resp:  [10-18] 10 (10/03 0331) BP: (104-137)/(61-90) 104/70 (10/03 0331) SpO2:  [93 %-100 %] 99 % (10/03 0331) Weight:  [83.9 kg] 83.9 kg (10/02 1052)  Hemodynamic parameters for last 24 hours:    Intake/Output from previous day: 10/02 0701 - 10/03 0700 In: 600 [I.V.:600] Out: 565 [Urine:500; Blood:50; Chest Tube:15] Intake/Output this shift: No intake/output data recorded.  General appearance: alert, cooperative, and no distress Neurologic: intact Heart: regular rate and rhythm, S1, S2 normal, no murmur, click, rub or gallop Lungs: Tight breath sounds on the left side  Abdomen: soft, non-tender; bowel sounds normal; no masses,  no organomegaly Extremities: SCDs in place Wound: Clean and dry dressing in place  Lab Results: Recent Labs    03/12/23 1447 03/15/23 0229  WBC 7.3 13.0*  HGB 11.5* 11.0*  HCT 36.1* 33.8*  PLT 260 220   BMET:  Recent Labs    03/12/23 1447 03/15/23 0229  NA 139 137  K 4.4 4.5  CL 103 101  CO2 28 23  GLUCOSE 103* 169*  BUN 11 19  CREATININE 1.29* 1.29*  CALCIUM 9.4 8.7*    PT/INR:  Recent Labs    03/12/23 1447  LABPROT 14.0  INR 1.1   ABG    Component Value Date/Time   PHART 7.494 (H) 10/02/2015 2225   HCO3 23.8 10/02/2015 2225   ACIDBASEDEF 1.7 10/02/2015 2225   O2SAT 96.6  10/02/2015 2225   CBG (last 3)  No results for input(s): "GLUCAP" in the last 72 hours.  Assessment/Plan: S/P Procedure(s) (LRB): XI ROBOTIC ASSISTED THORACOSCOPY-LEFT UPPER LOBE WEDGE RESECTION, LEFT UPPER LOBECTOMY (Left) NODE DISSECTION (Left) INTERCOSTAL NERVE BLOCK (Left)  Neuro: Pt complains of pain this AM. On home scheduled Oxycodone, scheduled Tylenol and Fentanyl PRN but has not received Fentanyl since last night. Continue current regimen.   CV: Vital signs stable. NSR, HR 60s. BP controlled.   Pulm: Saturating well on RA this AM. CT output 15cc/24hrs. CXR without pneumothorax. CT with +air leak with cough. Will leave CT in place to water seal. Tight breath sounds, hx of COPD. Will add nebs. Encourage IS and ambulation   GI: No GI complaints, advance diet as tolerated.   Endo: No hx of DM  ID: Leukocytosis WBC 13, likely reactive. Tmax 98.8.  Expected postop ABLA: H/H 11/33.8 not clinically significant at this time  Renal: Cr 1.29, stable. Will monitor. Foley catheter has been d/c'd, has not voided since then, will monitor for spontaneous void.   DVT Prophylaxis: Lovenox  Dispo: +air leak with cough, will leave chest tube to water seal.    LOS: 1 day    Jenny Reichmann, PA-C 03/15/2023  Agree with above IS, ambulation Clamping trial tomorrow  Corliss Skains

## 2023-03-15 NOTE — Plan of Care (Signed)

## 2023-03-16 ENCOUNTER — Inpatient Hospital Stay (HOSPITAL_COMMUNITY): Payer: Medicare Other

## 2023-03-16 LAB — CBC
HCT: 32.5 % — ABNORMAL LOW (ref 39.0–52.0)
Hemoglobin: 10.6 g/dL — ABNORMAL LOW (ref 13.0–17.0)
MCH: 30.1 pg (ref 26.0–34.0)
MCHC: 32.6 g/dL (ref 30.0–36.0)
MCV: 92.3 fL (ref 80.0–100.0)
Platelets: 193 10*3/uL (ref 150–400)
RBC: 3.52 MIL/uL — ABNORMAL LOW (ref 4.22–5.81)
RDW: 14.2 % (ref 11.5–15.5)
WBC: 8.9 10*3/uL (ref 4.0–10.5)
nRBC: 0 % (ref 0.0–0.2)

## 2023-03-16 LAB — BASIC METABOLIC PANEL
Anion gap: 9 (ref 5–15)
BUN: 19 mg/dL (ref 8–23)
CO2: 25 mmol/L (ref 22–32)
Calcium: 8.6 mg/dL — ABNORMAL LOW (ref 8.9–10.3)
Chloride: 105 mmol/L (ref 98–111)
Creatinine, Ser: 1.14 mg/dL (ref 0.61–1.24)
GFR, Estimated: >60 mL/min (ref >60)
Glucose, Bld: 106 mg/dL — ABNORMAL HIGH (ref 70–99)
Potassium: 4.1 mmol/L (ref 3.5–5.1)
Sodium: 139 mmol/L (ref 135–145)

## 2023-03-16 NOTE — Progress Notes (Signed)
Mobility Specialist Progress Note:   03/16/23 0927  Mobility  Activity Ambulated with assistance in hallway  Level of Assistance Contact guard assist, steadying assist  Assistive Device Front wheel walker  Distance Ambulated (ft) 270 ft    Pre Mobility: 79 HR During Mobility: 86 HR Post Mobility:  64 HR  Received pt in bed having no complaints and eager to mobility. C/o some soreness at chest tube site. Pt was asymptomatic throughout ambulation and returned to room w/o fault. Left in bed w/ call bell in reach and all needs met.   [name] Mobility Specialist Please contact via Special educational needs teacher or Rehab office at [phone]

## 2023-03-16 NOTE — Progress Notes (Signed)
      [address]       [place] [address]             [phone]      2 Days Post-Op Procedure(s) (LRB): XI ROBOTIC ASSISTED THORACOSCOPY-LEFT UPPER LOBE WEDGE RESECTION, LEFT UPPER LOBECTOMY (Left) NODE DISSECTION (Left) INTERCOSTAL NERVE BLOCK (Left) Subjective: The patient states he still has pain around his chest tube but it is better than yesterday.  Objective: Vital signs in last 24 hours: Temp:  [97.6 F (36.4 C)-98.4 F (36.9 C)] 97.6 F (36.4 C) (10/04 0405) Pulse Rate:  [60-80] 60 (10/04 0405) Cardiac Rhythm: Normal sinus rhythm (10/03 1900) Resp:  [11-19] 16 (10/04 0405) BP: (92-103)/(57-69) 100/69 (10/04 0405) SpO2:  [94 %-98 %] 94 % (10/04 0405)  Hemodynamic parameters for last 24 hours:    Intake/Output from previous day: 10/03 0701 - 10/04 0700 In: 960 [P.O.:960] Out: 2694 [Urine:2550; Chest Tube:144] Intake/Output this shift: No intake/output data recorded.  General appearance: alert, cooperative, and no distress Neurologic: intact Heart: regular rate and rhythm, S1, S2 normal, no murmur, click, rub or gallop Lungs: clear to auscultation bilaterally Abdomen: soft, non-tender; bowel sounds normal; no masses,  no organomegaly Extremities: SCDs in place Wound: Some serosanguinous drainage around the chest tube, otherwise incisions are clean and dry without sign of infection  Lab Results: Recent Labs    03/15/23 0229 03/16/23 0226  WBC 13.0* 8.9  HGB 11.0* 10.6*  HCT 33.8* 32.5*  PLT 220 193   BMET:  Recent Labs    03/15/23 0229 03/16/23 0226  NA 137 139  K 4.5 4.1  CL 101 105  CO2 23 25  GLUCOSE 169* 106*  BUN 19 19  CREATININE 1.29* 1.14  CALCIUM 8.7* 8.6*    PT/INR: No results for input(s): "LABPROT", "INR" in the last 72 hours. ABG    Component Value Date/Time   PHART 7.494 (H) 10/02/2015 2225   HCO3 23.8 10/02/2015 2225   ACIDBASEDEF 1.7 10/02/2015 2225   O2SAT 96.6 10/02/2015 2225   CBG (last 3)  No  results for input(s): "GLUCAP" in the last 72 hours.  Assessment/Plan: S/P Procedure(s) (LRB): XI ROBOTIC ASSISTED THORACOSCOPY-LEFT UPPER LOBE WEDGE RESECTION, LEFT UPPER LOBECTOMY (Left) NODE DISSECTION (Left) INTERCOSTAL NERVE BLOCK (Left)  Neuro: Pain has improved. Continue home scheduled Oxycodone, scheduled Tylenol and Fentanyl PRN.   CV: Vital signs stable. NSR, HR 60s. Soft BP but asymptomatic.    Pulm: Saturating well on RA this AM. CT output 144cc/24hrs. CXR with trace left pneumothorax. CT with +air leak with cough. Will leave CT in place to water seal. Continue nebs and inhalers for hx of COPD. Encourage IS and ambulation    GI: Tolerating diet  ID: Leukocytosis resolved   Expected postop ABLA: H/H 10.6/32.5 not clinically significant at this time   Renal: Cr 1.14 improved from 1.29 yesterday. Good UO  DVT Prophylaxis: Lovenox   Dispo: +air leak with cough, will leave chest tube to water seal.    LOS: 2 days    Jenny Reichmann 03/16/2023

## 2023-03-16 NOTE — Discharge Instructions (Signed)
Robot-Assisted Thoracic Surgery, Care After The following information offers guidance on how to care for yourself after your procedure. Your health care provider may also give you more specific instructions. If you have problems or questions, contact your health care provider. What can I expect after the procedure? After the procedure, it is common to have: Some pain and aches in the area of your surgical incisions. Pain when breathing in (inhaling) and coughing. Tiredness (fatigue). Trouble sleeping. Constipation. Follow these instructions at home: Medicines Take over-the-counter and prescription medicines only as told by your health care provider. If you were prescribed an antibiotic medicine, take it as told by your health care provider. Do not stop taking the antibiotic even if you start to feel better. Talk with your health care provider about safe and effective ways to manage pain after your procedure. Pain management should fit your specific health needs. Take pain medicine before pain becomes severe. Relieving and controlling your pain will make breathing easier for you. Ask your health care provider if the medicine prescribed to you requires you to avoid driving or using machinery. Eating and drinking Follow instructions from your health care provider about eating or drinking restrictions. These will vary depending on what procedure you had. Your health care provider may recommend: A liquid diet or soft diet for the first few days. Meals that are smaller and more frequent. A diet of fruits, vegetables, whole grains, and low-fat proteins. Limiting foods that are high in fat and processed sugar, including fried or sweet foods. Incision care Follow instructions from your health care provider about how to take care of your incisions. Make sure you: Wash your hands with soap and water for at least 20 seconds before and after you change your bandage (dressing). If soap and water are not  available, use hand sanitizer. Change your dressing as told by your health care provider. Leave stitches (sutures), skin glue, or adhesive strips in place. These skin closures may need to stay in place for 2 weeks or longer. If adhesive strip edges start to loosen and curl up, you may trim the loose edges. Do not remove adhesive strips completely unless your health care provider tells you to do that. Check your incision area every day for signs of infection. Check for: Redness, swelling, or more pain. Fluid or blood. Warmth. Pus or a bad smell. Activity Return to your normal activities as told by your health care provider. Ask your health care provider what activities are safe for you. Ask your health care provider when it is safe for you to drive. Do not lift anything that is heavier than 10 lb (4.5 kg), or the limit that you are told, until your health care provider says that it is safe. Rest as told by your health care provider. Avoid sitting for a long time without moving. Get up to take short walks every 1-2 hours. This is important to improve blood flow and breathing. Ask for help if you feel weak or unsteady. Do exercises as told by your health care provider. Pneumonia prevention  Do deep breathing exercises and cough regularly as directed. This helps clear mucus and opens your lungs. Doing this helps prevent lung infection (pneumonia). If you were given an incentive spirometer, use it as told. An incentive spirometer is a tool that measures how well you are filling your lungs with each breath. Coughing may hurt less if you try to support your chest. This is called splinting. Try one of these when you  cough: Hold a pillow against your chest. Place the palms of both hands on top of your incision area. Do not use any products that contain nicotine or tobacco. These products include cigarettes, chewing tobacco, and vaping devices, such as e-cigarettes. If you need help quitting, ask your  health care provider. Avoid secondhand smoke. General instructions If you have a drainage tube: Follow instructions from your health care provider about how to take care of it. Do not travel by airplane after your tube is removed until your health care provider tells you it is safe. You may need to take these actions to prevent or treat constipation: Drink enough fluid to keep your urine pale yellow. Take over-the-counter or prescription medicines. Eat foods that are high in fiber, such as beans, whole grains, and fresh fruits and vegetables. Limit foods that are high in fat and processed sugars, such as fried or sweet foods. Keep all follow-up visits. This is important. Contact a health care provider if: You have redness, swelling, or more pain around an incision. You have fluid or blood coming from an incision. An incision feels warm to the touch. You have pus or a bad smell coming from an incision. You have a fever. You cannot eat or drink without vomiting. Your pain medicine is not controlling your pain. Get help right away if: You have chest pain. Your heart is beating quickly. You have trouble breathing. You have trouble speaking. You are confused. You feel weak or dizzy, or you faint. These symptoms may represent a serious problem that is an emergency. Do not wait to see if the symptoms will go away. Get medical help right away. Call your local emergency services (911 in the [place] Do not drive yourself to the hospital. Summary Talk with your health care provider about safe and effective ways to manage pain after your procedure. Pain management should fit your specific health needs. Return to your normal activities as told by your health care provider. Ask your health care provider what activities are safe for you. Do deep breathing exercises and cough regularly as directed. This helps to clear mucus and prevent pneumonia. If it hurts to cough, ease pain by holding a pillow  against your chest or by placing the palms of both hands over your incisions. This information is not intended to replace advice given to you by your health care provider. Make sure you discuss any questions you have with your health care provider. Document Revised: 02/19/2020 Document Reviewed: 02/20/2020 Elsevier Patient Education  2024 [employer]  Discharge Instructions:  1. You may shower, please wash incisions daily with soap and water and keep dry.  If you wish to cover wounds with dressing you may do so but please keep clean and change daily.  No tub baths or swimming until incisions have completely healed.  If your incisions become red or develop any drainage please call our office at [phone]  2. No Driving until cleared by Dr. Lucilla Lame office and you are no longer using narcotic pain medications  3. Fever of 101.5 for at least 24 hours with no source, please contact our office at [phone]  4. Activity- up as tolerated, please walk at least 3 times per day.  Avoid strenuous activity, no lifting, pushing, or pulling with your arms over 8-10 lbs for a minimum of 6 weeks  5. If any questions or concerns arise, please do not hesitate to contact our office at [phone]

## 2023-03-17 ENCOUNTER — Inpatient Hospital Stay (HOSPITAL_COMMUNITY): Payer: Medicare Other

## 2023-03-17 NOTE — Progress Notes (Cosign Needed Addendum)
      [address]       [place] [address]             [phone]      3 Days Post-Op Procedure(s) (LRB): XI ROBOTIC ASSISTED THORACOSCOPY-LEFT UPPER LOBE WEDGE RESECTION, LEFT UPPER LOBECTOMY (Left) NODE DISSECTION (Left) INTERCOSTAL NERVE BLOCK (Left)  Subjective:  Patient sitting up in bed.  Hoping to go home.  He is very hard of hearing.  Objective: Vital signs in last 24 hours: Temp:  [97.5 F (36.4 C)-98.7 F (37.1 C)] 98.2 F (36.8 C) (10/05 0733) Pulse Rate:  [62-72] 63 (10/05 0401) Cardiac Rhythm: Normal sinus rhythm (10/05 0757) Resp:  [11-19] 15 (10/05 0401) BP: (95-128)/(63-74) 105/66 (10/05 0733) SpO2:  [94 %-96 %] 96 % (10/05 0824)  Intake/Output from previous day: 10/04 0701 - 10/05 0700 In: 660 [P.O.:660] Out: 926 [Urine:900; Chest Tube:26] Intake/Output this shift: Total I/O In: 120 [P.O.:120] Out: -   General appearance: alert, cooperative, and no distress Heart: regular rate and rhythm Lungs: diminished breath sounds left apex, CTA bilaterally Abdomen: soft, non-tender; bowel sounds normal; no masses,  no organomegaly Wound: clean and dry  Lab Results: Recent Labs    03/15/23 0229 03/16/23 0226  WBC 13.0* 8.9  HGB 11.0* 10.6*  HCT 33.8* 32.5*  PLT 220 193   BMET:  Recent Labs    03/15/23 0229 03/16/23 0226  NA 137 139  K 4.5 4.1  CL 101 105  CO2 23 25  GLUCOSE 169* 106*  BUN 19 19  CREATININE 1.29* 1.14  CALCIUM 8.7* 8.6*    PT/INR: No results for input(s): "LABPROT", "INR" in the last 72 hours. ABG    Component Value Date/Time   PHART 7.494 (H) 10/02/2015 2225   HCO3 23.8 10/02/2015 2225   ACIDBASEDEF 1.7 10/02/2015 2225   O2SAT 96.6 10/02/2015 2225   CBG (last 3)  No results for input(s): "GLUCAP" in the last 72 hours.  Assessment/Plan: S/P Procedure(s) (LRB): XI ROBOTIC ASSISTED THORACOSCOPY-LEFT UPPER LOBE WEDGE RESECTION, LEFT UPPER LOBECTOMY (Left) NODE DISSECTION (Left) INTERCOSTAL NERVE  BLOCK (Left)  CV- Sinus Bradycardia, BP stable Pulm- CT removed yesterday, CXR with increase in pneumothorax.. will discuss with Dr. Cliffton Asters Renal- creatinine improved down to 1.14 DVT prophylaxis continue Lovenox Dispo- unfortunately patient developed increase in left sided pneumothorax post chest tube removal, he is asymptomatic from this.. will keep patient today for observation and repeat CXR in AM if remains stable possibly for d/c in AM   LOS: 3 days    Lowella Dandy, PA-C 03/17/2023   Addendum: As discussed with Dr. Cliffton Asters and review of CXR okay for discharge home today.

## 2023-03-17 NOTE — Plan of Care (Signed)
  Problem: Education: Goal: Knowledge of disease or condition will improve Outcome: Progressing   Problem: Activity: Goal: Risk for activity intolerance will decrease Outcome: Progressing   Problem: Clinical Measurements: Goal: Postoperative complications will be avoided or minimized Outcome: Progressing   Problem: Respiratory: Goal: Respiratory status will improve Outcome: Progressing   Problem: Pain Management: Goal: Pain level will decrease Outcome: Progressing   Problem: Skin Integrity: Goal: Wound healing without signs and symptoms infection will improve Outcome: Progressing

## 2023-03-17 NOTE — Progress Notes (Signed)
Mobility Specialist Progress Note    03/17/23 1000  Mobility  Activity Ambulated with assistance in hallway  Level of Assistance Contact guard assist, steadying assist  Assistive Device Front wheel walker  Distance Ambulated (ft) 270 ft  Activity Response Tolerated well  Mobility Referral Yes  $Mobility charge 1 Mobility  Mobility Specialist Start Time (ACUTE ONLY) 0949  Mobility Specialist Stop Time (ACUTE ONLY) 1000  Mobility Specialist Time Calculation (min) (ACUTE ONLY) 11 min   Pre-Mobility: 66 HR, 98% SpO2 Post-Mobility: 69 HR  Pt received in bed and agreeable. Pt stated "I will tell you I am sore as the devil right now where that tube was." Returned to bed with call bell in reach.   [place] Mobility Specialist  Please Neurosurgeon or Rehab Office at [phone]

## 2023-03-19 LAB — SURGICAL PATHOLOGY

## 2023-03-22 ENCOUNTER — Other Ambulatory Visit: Payer: Self-pay

## 2023-03-23 ENCOUNTER — Encounter: Payer: Self-pay | Admitting: Thoracic Surgery (Cardiothoracic Vascular Surgery)

## 2023-03-23 ENCOUNTER — Ambulatory Visit (INDEPENDENT_AMBULATORY_CARE_PROVIDER_SITE_OTHER): Payer: Self-pay | Admitting: Thoracic Surgery (Cardiothoracic Vascular Surgery)

## 2023-03-23 VITALS — BP 122/72 | HR 76 | Resp 20 | Wt 182.2 lb

## 2023-03-23 DIAGNOSIS — Z902 Acquired absence of lung [part of]: Secondary | ICD-10-CM

## 2023-03-23 MED ORDER — OXYCODONE HCL 15 MG PO TABS: 15.0000 mg | ORAL_TABLET | Freq: Four times a day (QID) | ORAL | 0 refills | Status: DC | PRN

## 2023-03-23 NOTE — Progress Notes (Signed)
The proposed treatment discussed in conference is for discussion purpose only and is not a binding recommendation.  The patients have not been physically examined, or presented with their treatment options.  Therefore, final treatment plans cannot be decided.  

## 2023-03-23 NOTE — Progress Notes (Signed)
[address]       [place] [address]             [phone]        Joshua [name] [facility] Medical Record #[id number] Date of Birth: 10/14/1950  Referring: Josephine Igo, DO Primary Care: Elfredia Nevins, MD Primary Cardiologist:None  Reason for visit:   follow-up  History of Present Illness:     71yo male presents for their first follow-up appointment.  Overall, he is doing well.    Physical Exam: There were no vitals taken for this visit.  Alert NAD Incision clean.   Abdomen, ND no peripheral edema   Diagnostic Studies & Laboratory data:  Path: FINAL MICROSCOPIC DIAGNOSIS:  A. 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      Tumor size: 4.2 x 2.2 x 1.5 cm.      Spread through air way identified.      Visceral pleural invasion identified.      See part E for final margin status.      See oncology table.  B. LYMPH NODE, LEVEL 6, EXCISION:      One lymph node, negative for metastatic carcinoma (0/1).  C. LYMPH NODE, LEVEL 5, EXCISION:      One lymph node, negative for metastatic carcinoma (0/1).  D. LYMPH NODE, LEVEL 9, EXCISION:      One lymph node, negative for metastatic carcinoma (0/1).  E. LUNG, LEFT UPPER LOBE, LOBECTOMY:      Benign pulmonary parenchyma with consolidation and interstitial fibrosis. Bronchial margin and vascular margin are negative for carcinoma. One focus of necrotizing granuloma.      One lymph node, negative for metastatic carcinoma (0/1).  ONCOLOGY TABLE:  LUNG: Resection  Synchronous Tumors: No Total Number of Primary Tumors: 1 Procedure: Lobectomy Specimen Laterality: Left Tumor Focality: Unifocal Tumor Site: Left upper lobe Tumor Size: 4.2 x 2.2 x 1.5 cm      Total Tumor Size: 4.2 cm in maximal dimension      Invasive Tumor Size (applies only to invasive nonmucinous adenocarcinoma with a lepidic           component): 4.2 cm Histologic  Type: Adenocarcinoma Histologic Grade: Poorly differentiated  (G3) Visceral Pleura Invasion: Identified Direct Invasion of Adjacent Structures: No adjacent structures present Lymphovascular Invasion: Not identified Margins: All margins negative for invasive carcinoma      Closest Margin(s) to Invasive Carcinoma: Cannot be determined (separated wedge resection and lobectomy)      Margin(s) Involved by Invasive Carcinoma: Not applicable       Margin Status for Non-Invasive Tumor: Not applicable Treatment Effect: No known presurgical therapy      Percentage of Residual Viable Tumor: NA Regional Lymph Nodes:      Number of Lymph Nodes Involved: 0                           Nodal Sites with Tumor: NA      Number of Lymph Nodes Examined: 4                      Nodal Sites Examined: Level 5, 6, 9, hilar Distant Metastasis:      Distant Site(s) Involved: Not applicable Pathologic Stage Classification (pTNM, AJCC 8th Edition): pT2b, pN0     Assessment / Plan:   72yo male s/p  left upper lobecetomy for  T2bN0M0 adenocarcinoma.  I have made a referral to medical oncology, along with a 1 month follow-up with a CXR.  Refill on oxycodone given.  Will stagger NSAIDS.   Corliss Skains 03/23/2023 10:16 AM

## 2023-03-27 ENCOUNTER — Encounter (HOSPITAL_COMMUNITY): Payer: Self-pay

## 2023-03-27 NOTE — Progress Notes (Signed)
I reached out to the pt on his home phone to offer him the appt slot tomorrow at 2:15 with Dr.Mohamed and his mother, Joshua [name],  answered the phone. She told me the pt has been sick in bed with vomiting for the last 3 days and probably wouldn't be able to make an appt for tomorrow. She asked me to call his sister, Joshua Asp, with whom the pt is staying. I gave Joshua [name] my name and direct number and encouraged her to call with any questions.   I called pts sister, Joshua Asp,  and she states that the pt seems to be getting better. He hasn't vomited since yesterday and he drank his coffee this morning which is a good sign. I offered Joshua Asp the appt on 10/22 at 2:15 with Dr.Mohamed and she agreed to take it. Joshua Asp is aware of our location and that he will also need a lab appt for 1:45. I provided my direct number to [facility] and encouraged her to call me with any questions

## 2023-03-29 DIAGNOSIS — C3492 Malignant neoplasm of unspecified part of left bronchus or lung: Secondary | ICD-10-CM | POA: Diagnosis not present

## 2023-03-29 DIAGNOSIS — J449 Chronic obstructive pulmonary disease, unspecified: Secondary | ICD-10-CM | POA: Diagnosis not present

## 2023-03-29 DIAGNOSIS — R5082 Postprocedural fever: Secondary | ICD-10-CM | POA: Diagnosis not present

## 2023-03-29 DIAGNOSIS — M1991 Primary osteoarthritis, unspecified site: Secondary | ICD-10-CM | POA: Diagnosis not present

## 2023-03-30 ENCOUNTER — Other Ambulatory Visit: Payer: Self-pay | Admitting: Internal Medicine

## 2023-03-30 ENCOUNTER — Encounter (HOSPITAL_COMMUNITY): Payer: Self-pay

## 2023-03-30 ENCOUNTER — Other Ambulatory Visit: Payer: Self-pay

## 2023-03-30 DIAGNOSIS — C3490 Malignant neoplasm of unspecified part of unspecified bronchus or lung: Secondary | ICD-10-CM

## 2023-03-30 DIAGNOSIS — N390 Urinary tract infection, site not specified: Secondary | ICD-10-CM | POA: Diagnosis not present

## 2023-03-30 NOTE — Progress Notes (Signed)
Lab orders needed but no diagnosis code to correlate with lab draw. Will wait for Dr.Mohamed visit

## 2023-04-02 DIAGNOSIS — C3492 Malignant neoplasm of unspecified part of left bronchus or lung: Secondary | ICD-10-CM | POA: Diagnosis not present

## 2023-04-02 DIAGNOSIS — M961 Postlaminectomy syndrome, not elsewhere classified: Secondary | ICD-10-CM | POA: Diagnosis not present

## 2023-04-02 DIAGNOSIS — Z79899 Other long term (current) drug therapy: Secondary | ICD-10-CM | POA: Diagnosis not present

## 2023-04-03 ENCOUNTER — Inpatient Hospital Stay: Payer: Medicare Other | Attending: Internal Medicine

## 2023-04-03 ENCOUNTER — Inpatient Hospital Stay: Payer: Medicare Other | Admitting: Internal Medicine

## 2023-04-03 ENCOUNTER — Encounter: Payer: Self-pay | Admitting: Internal Medicine

## 2023-04-03 VITALS — BP 117/73 | HR 68 | Temp 98.3°F | Resp 17 | Ht 68.0 in | Wt 174.5 lb

## 2023-04-03 DIAGNOSIS — Z902 Acquired absence of lung [part of]: Secondary | ICD-10-CM

## 2023-04-03 DIAGNOSIS — Z87891 Personal history of nicotine dependence: Secondary | ICD-10-CM | POA: Diagnosis not present

## 2023-04-03 DIAGNOSIS — Z801 Family history of malignant neoplasm of trachea, bronchus and lung: Secondary | ICD-10-CM

## 2023-04-03 DIAGNOSIS — C3490 Malignant neoplasm of unspecified part of unspecified bronchus or lung: Secondary | ICD-10-CM

## 2023-04-03 DIAGNOSIS — C3412 Malignant neoplasm of upper lobe, left bronchus or lung: Secondary | ICD-10-CM | POA: Diagnosis not present

## 2023-04-03 LAB — CBC WITH DIFFERENTIAL (CANCER CENTER ONLY)
Abs Immature Granulocytes: 0.05 10*3/uL (ref 0.00–0.07)
Basophils Absolute: 0.1 10*3/uL (ref 0.0–0.1)
Basophils Relative: 1 %
Eosinophils Absolute: 0.2 10*3/uL (ref 0.0–0.5)
Eosinophils Relative: 2 %
HCT: 32.9 % — ABNORMAL LOW (ref 39.0–52.0)
Hemoglobin: 10.9 g/dL — ABNORMAL LOW (ref 13.0–17.0)
Immature Granulocytes: 1 %
Lymphocytes Relative: 18 %
Lymphs Abs: 1.7 10*3/uL (ref 0.7–4.0)
MCH: 29.5 pg (ref 26.0–34.0)
MCHC: 33.1 g/dL (ref 30.0–36.0)
MCV: 89.2 fL (ref 80.0–100.0)
Monocytes Absolute: 1.1 10*3/uL — ABNORMAL HIGH (ref 0.1–1.0)
Monocytes Relative: 12 %
Neutro Abs: 6 10*3/uL (ref 1.7–7.7)
Neutrophils Relative %: 66 %
Platelet Count: 371 10*3/uL (ref 150–400)
RBC: 3.69 MIL/uL — ABNORMAL LOW (ref 4.22–5.81)
RDW: 14.1 % (ref 11.5–15.5)
WBC Count: 9.1 10*3/uL (ref 4.0–10.5)
nRBC: 0 % (ref 0.0–0.2)

## 2023-04-03 LAB — CMP (CANCER CENTER ONLY)
ALT: 15 U/L (ref 0–44)
AST: 17 U/L (ref 15–41)
Albumin: 3.8 g/dL (ref 3.5–5.0)
Alkaline Phosphatase: 44 U/L (ref 38–126)
Anion gap: 6 (ref 5–15)
BUN: 15 mg/dL (ref 8–23)
CO2: 28 mmol/L (ref 22–32)
Calcium: 9.4 mg/dL (ref 8.9–10.3)
Chloride: 106 mmol/L (ref 98–111)
Creatinine: 1.01 mg/dL (ref 0.61–1.24)
GFR, Estimated: >60 mL/min (ref >60)
Glucose, Bld: 105 mg/dL — ABNORMAL HIGH (ref 70–99)
Potassium: 3.6 mmol/L (ref 3.5–5.1)
Sodium: 140 mmol/L (ref 135–145)
Total Bilirubin: 0.5 mg/dL (ref 0.3–1.2)
Total Protein: 6.4 g/dL — ABNORMAL LOW (ref 6.5–8.1)

## 2023-04-03 MED ORDER — DEXAMETHASONE 4 MG PO TABS
ORAL_TABLET | ORAL | 1 refills | Status: DC
Start: 2023-04-22 — End: 2023-09-11

## 2023-04-03 MED ORDER — FOLIC ACID 1 MG PO TABS: 1.0000 mg | ORAL_TABLET | Freq: Every day | ORAL | 3 refills | Status: AC

## 2023-04-03 MED ORDER — PROCHLORPERAZINE MALEATE 10 MG PO TABS: 10.0000 mg | ORAL_TABLET | Freq: Four times a day (QID) | ORAL | 1 refills | Status: DC | PRN

## 2023-04-03 NOTE — Progress Notes (Signed)
START ON PATHWAY REGIMEN - Non-Small Cell Lung     A cycle is every 21 days:     Pemetrexed      Carboplatin   **Always confirm dose/schedule in your pharmacy ordering system**  Patient Characteristics: Postoperative without Neoadjuvant Therapy (Pathologic Staging), Stage IB (Tumor Size = 4 cm) or Stage II / III, Adjuvant Chemotherapy Planned, Stage IB (Tumor Size = 4 cm), or Stage II / III, Nonsquamous Cell Therapeutic Status: Postoperative without Neoadjuvant Therapy (Pathologic Staging) AJCC T Category: pT2b AJCC N Category: pN0 AJCC M Category: cM0 AJCC 8 Stage Grouping: IIA Adjuvant Chemotherapy Status: Adjuvant Chemotherapy Planned Histology: Nonsquamous Cell Intent of Therapy: Curative Intent, Discussed with Patient

## 2023-04-03 NOTE — Progress Notes (Signed)
[facility] [phone]   [phone]  CONSULT NOTE  REFERRING PHYSICIAN: Dr. Brynda Greathouse  REASON FOR CONSULTATION:  72 years old white male recently diagnosed with lung cancer.  HPI Joshua [name] is a 72 y.o. male came to the clinic today for initial evaluation accompanied by his 2 sisters.  The patient has severe hearing deficit and his sister has been helping with the visit.Discussed the use of AI scribe software for clinical note transcription with the patient, who gave verbal consent to proceed.  History of Present Illness   The patient, a 72 year old with significant hearing impairment, was recently diagnosed with lung cancer. The diagnosis was incidental, discovered during a hospital visit for back pain, initially suspected to be a kidney infection. However, the patient was diagnosed with mild pneumonia and referred to a pulmonologist. Subsequent CT and PET  scans revealed nodules in the left upper lobe of the lung.  In October, the patient underwent surgery to remove the left upper lobe of the lung, which contained a large tumor measuring 4.2 x 2.2 x 1.5 cm. The pathology report confirmed the diagnosis of adenocarcinoma of the lung. Molecular studies were negative, indicating no actionable mutations or PDL1 expression.  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 He has a significant smoking history, having quit only four to five months prior to the consultation. The patient denies regular alcohol consumption and illicit drug use.  The patient's father had lung cancer and heart issues, having worked in [employer] all his life. The patient's mother, currently 53 years old, has arthritis. One of the patient's sisters had ovarian cancer, and another had skin cancer. The patient has three daughters, one of whom is a Engineer, civil (consulting) and holds power of attorney.  The patient reported no current  complaints or symptoms at the time of the consultation. He expressed a desire for independence and a dislike for being "carted off from one doctor to the next."      HPI  Past Medical History:  Diagnosis Date   Arthritis    Back pain    Cancer (HCC)    bladder in the past    Chronic pain    COPD (chronic obstructive pulmonary disease) (HCC)    HOH (hard of hearing)    VERY   Meniere's disease    PONV (postoperative nausea and vomiting)    Pulmonary nodule, left    Stroke (HCC)    30 years ago    Past Surgical History:  Procedure Laterality Date   BACK SURGERY  06/03/2018   spinal cord stimulator insertions   BLADDER SURGERY     HYDROCELE EXCISION Right 11/26/2018   Procedure: RIGHT HYDROCELECTOMY;  Surgeon: Marcine Matar, MD;  Location: [facility];  Service: Urology;  Laterality: Right;   INTERCOSTAL NERVE BLOCK Left 03/14/2023   Procedure: INTERCOSTAL NERVE BLOCK;  Surgeon: Corliss Skains, MD;  Location: [facility];  Service: Thoracic;  Laterality: Left;   NASAL SINUS SURGERY     NODE DISSECTION Left 03/14/2023   Procedure: NODE DISSECTION;  Surgeon: Corliss Skains, MD;  Location: [facility];  Service: Thoracic;  Laterality: Left;    No family history on file.  Social History Social History   Tobacco Use   Smoking status: Former    Current packs/day: 0.00    Types: Cigarettes    Quit date: 08/15/2022    Years since quitting: 0.6  Smokeless tobacco: Current    Types: Snuff   Tobacco comments:    Using snuff.  Vaping Use   Vaping status: Never Used  Substance Use Topics   Alcohol use: No   Drug use: Never    No Known Allergies  Current Outpatient Medications  Medication Sig Dispense Refill   albuterol (PROVENTIL HFA;VENTOLIN HFA) 108 (90 BASE) MCG/ACT inhaler Inhale 2 puffs into the lungs every 6 (six) hours as needed for wheezing or shortness of breath.      atorvastatin (LIPITOR) 20 MG tablet Take 20 mg by mouth daily.     cyclobenzaprine (FLEXERIL) 10  MG tablet Take 10 mg by mouth 3 (three) times daily as needed for muscle spasms.     diazepam (VALIUM) 10 MG tablet Take 10 mg by mouth every 8 (eight) hours as needed for anxiety or sleep.     Fluticasone-Umeclidin-Vilant (TRELEGY ELLIPTA) 100-62.5-25 MCG/ACT AEPB One click each am 28 each 11   gabapentin (NEURONTIN) 300 MG capsule Take 300 mg by mouth 3 (three) times daily.     nabumetone (RELAFEN) 500 MG tablet Take 500 mg by mouth in the morning, at noon, and at bedtime.     oxyCODONE (ROXICODONE) 15 MG immediate release tablet Take 1 tablet (15 mg total) by mouth every 6 (six) hours as needed for pain. 50 tablet 0   oxymetazoline (AFRIN) 0.05 % nasal spray Place 1 spray into both nostrils 2 (two) times daily.     No current facility-administered medications for this visit.    Review of Systems  Constitutional: positive for fatigue Eyes: negative Ears, nose, mouth, throat, and face: positive for hearing loss Respiratory: negative Cardiovascular: negative Gastrointestinal: negative Genitourinary:negative Integument/breast: negative Hematologic/lymphatic: negative Musculoskeletal:negative Neurological: negative Behavioral/Psych: negative Endocrine: negative Allergic/Immunologic: negative  Physical Exam  VFI:EPPIR, healthy, no distress, well nourished, and well developed SKIN: skin color, texture, turgor are normal, no rashes or significant lesions HEAD: Normocephalic, No masses, lesions, tenderness or abnormalities EYES: normal, PERRLA, Conjunctiva are pink and non-injected EARS: External ears normal, Canals clear OROPHARYNX:no exudate, no erythema, and lips, buccal mucosa, and tongue normal  NECK: supple, no adenopathy, no JVD LYMPH:  no palpable lymphadenopathy, no hepatosplenomegaly LUNGS: clear to auscultation , and palpation HEART: regular rate & rhythm, no murmurs, and no gallops ABDOMEN:abdomen soft, non-tender, normal bowel sounds, and no masses or  organomegaly BACK: Back symmetric, no curvature., No CVA tenderness EXTREMITIES:no joint deformities, effusion, or inflammation, no edema  NEURO: alert & oriented x 3 with fluent speech, no focal motor/sensory deficits  PERFORMANCE STATUS: ECOG 1  LABORATORY DATA: Lab Results  Component Value Date   WBC 9.1 04/03/2023   HGB 10.9 (L) 04/03/2023   HCT 32.9 (L) 04/03/2023   MCV 89.2 04/03/2023   PLT 371 04/03/2023      Chemistry      Component Value Date/Time   NA 139 03/16/2023 0226   NA 138 03/09/2022 1547   K 4.1 03/16/2023 0226   CL 105 03/16/2023 0226   CO2 25 03/16/2023 0226   BUN 19 03/16/2023 0226   BUN 13 03/09/2022 1547   CREATININE 1.14 03/16/2023 0226      Component Value Date/Time   CALCIUM 8.6 (L) 03/16/2023 0226   ALKPHOS 42 03/12/2023 1447   AST 23 03/12/2023 1447   ALT 20 03/12/2023 1447   BILITOT 0.5 03/12/2023 1447       RADIOGRAPHIC STUDIES: DG CHEST PORT 1 VIEW  Result Date: 03/17/2023 CLINICAL DATA:  [id number] with  left pneumothorax. Left chest tube removed. Follow-up. EXAM: PORTABLE CHEST 1 VIEW COMPARISON:  Portable chest yesterday at 11:53 a.m. FINDINGS: 5:15 a.m. Interval left chest tube removal with increased left apicolateral pneumothorax now estimated about 10% chest volume without mediastinal shift. Postsurgical changes along the upper to mid left hilum are again noted. Stable atelectasis at the lung bases. The lungs are emphysematous and otherwise clear. There is mild cardiomegaly. No findings of CHF. Stable mediastinal configuration with aortic atherosclerosis. Overlying monitor wires on the right. Spinal cord stimulator leads in the lower thoracic spinal canal are again noted with no new osseous findings. IMPRESSION: 1. Increased left apicolateral pneumothorax now estimated about 10% chest volume following left chest tube removal. 2. COPD with bibasilar atelectasis. 3. Mild cardiomegaly. 4. Aortic atherosclerosis. 5. These results will be called to  the ordering clinician or representative by the Radiologist Assistant, and communication documented in the PACS or [employer]. Electronically Signed   By: Almira Bar M.D.   On: 03/17/2023 07:33   DG CHEST PORT 1 VIEW  Result Date: 03/16/2023 CLINICAL DATA:  Follow-up left pneumothorax. EXAM: PORTABLE CHEST 1 VIEW COMPARISON:  Earlier today. FINDINGS: Stable left chest tube with its tip at the left lung apex. Interval decrease in size of a less than 5% left upper lateral pneumothorax. Stable left perihilar surgical staples, neural stimulator leads in the lower thoracic spinal canal and tiny metallic foreign bodies in that area. Stable right upper paraseptal bullous changes and small amount of linear scarring in the right lower lung zone. The heart remains normal in size. IMPRESSION: Interval decrease in size of a less than 5% left upper lateral pneumothorax. Electronically Signed   By: Beckie Salts M.D.   On: 03/16/2023 13:43   DG CHEST PORT 1 VIEW  Result Date: 03/16/2023 CLINICAL DATA:  Follow-up left pneumothorax. EXAM: PORTABLE CHEST 1 VIEW COMPARISON:  03/15/2023 FINDINGS: Left chest tube remains in place. Interval less than 5% left upper lateral pneumothorax. Stable bullous changes and scarring on the right. Mildly progressive elevation of the left hemidiaphragm. Normal sized heart. Stable neural stimulator leads in the thoracic spinal canal and multiple tiny metallic foreign bodies in that region. IMPRESSION: 1. Interval less than 5% left upper lateral pneumothorax. 2. Mildly progressive elevation of the left hemidiaphragm. 3. 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 Electronically Signed   By: Beckie Salts M.D.   On: 03/16/2023 13:41   DG Chest Port 1 View  Result Date: 03/15/2023 CLINICAL DATA:  Status post lobectomy. EXAM: PORTABLE CHEST 1 VIEW COMPARISON:  March 14, 2023. FINDINGS: Stable cardiomediastinal silhouette. Left-sided chest tube is noted without definite  pneumothorax. Minimal right midlung subsegmental atelectasis is noted. Bony thorax is unremarkable. IMPRESSION: No active disease. Electronically Signed   By: Lupita Raider M.D.   On: 03/15/2023 10:33   DG Chest Port 1 View  Result Date: 03/14/2023 CLINICAL DATA:  Status post left lobectomy, initial encounter EXAM: PORTABLE CHEST 1 VIEW COMPARISON:  03/12/2023 FINDINGS: Chest tube is noted on the left. Small pneumothorax is noted consistent with incomplete re-expansion of the lung following the left lobectomy. Right lung is clear. Skin folds are noted over the upper chest. No focal infiltrate is seen. No bony abnormality is noted. IMPRESSION: Small left pneumothorax with chest tube in place consistent with the given clinical history. Follow-up films are recommended. Electronically Signed   By: Alcide Clever M.D.   On: 03/14/2023 22:14   DG Chest 2 View  Result Date: 03/13/2023 CLINICAL DATA:  Preop chest exam.  Pulmonary nodule. EXAM: CHEST - 2 VIEW COMPARISON:  CT 12/29/2022 FINDINGS: Normal heart size. Stable mediastinal contours. Aortic atherosclerosis. Atrial septal occlusion device. Emphysema. Nodules on prior CT are not well-defined by radiograph. No pleural fluid or pneumothorax. Spinal stimulator in place. IMPRESSION: 1. Emphysema. 2. Nodules on prior CT are not well-defined by radiograph. Electronically Signed   By: Narda Rutherford M.D.   On: 03/13/2023 21:55   Myocardial Perfusion Imaging  Result Date: 03/12/2023   A pharmacological stress test was performed using IV Lexiscan 0.4mg  over 10 seconds performed without concurrent submaximal exercise. Normal blood pressure and normal heart rate response noted during stress. Heart rate recovery was normal.   No ST deviation was noted. There were no arrhythmias during stress. ECG was interpretable and without significant changes. The ECG was not diagnostic due to pharmacologic protocol.   Diaphragmatic attenuation artifact was present.   LV  perfusion is abnormal. There is evidence of ischemia. There is no evidence of infarction. Defect 1: There is a very small defect with mild reduction in uptake present in the apical apex location(s) that is reversible. There is normal wall motion in the defect area. Consistent with a very small area of ischemia.   Left ventricular function is abnormal. Global function is mildly reduced. Nuclear stress EF: 44%. The left ventricular ejection fraction is moderately decreased (30-44%). End diastolic cavity size is mildly enlarged. End systolic cavity size is normal. No evidence of transient ischemic dilation (TID) noted.   Prior study not available for comparison.   Findings are consistent with a very small area of ischemia vs. variations in diaphragmatic attenuation artifact. The study is intermediate risk due to reduced EF.    ASSESSMENT AND PLAN: This is a very pleasant 72 years old white male with significant hearing deficit recently diagnosed with:    Non-Small Cell Lung Cancer, Adenocarcinoma, Stage 2A (T2b, N0, M0) diagnosed in October of 2024, Status post left upper lobectomy with lymph node sampling under the care of Dr. Cliffton Asters. Recently diagnosed following presentation with back pain and subsequent discovery of a rapidly growing tumor (4.2 x 2.2 x 1.5 cm). left Upper lobe of lung resected. Molecular study showed no actionable mutations or markers for immune therapy. - I had a lengthy discussion with the patient and his sister today about his current disease stage, prognosis and treatment options. - I gave the patient the option of close monitoring and observation versus consideration of 4 cycles of adjuvant systemic chemotherapy with platinum based regimen consisting of carboplatin for AUC of 5 and Alimta 500 Mg/M2 every 3 weeks.  The patient is not a great candidate for adjuvant treatment with cisplatin because of his renal insufficiency as well as the significant hearing deficit.  The patient and  his sister were interested in the treatment. - I discussed with the patient and his family the adverse effect of this treatment including but not limited to alopecia, myelosuppression, nausea and vomiting, peripheral neuropathy, liver or renal dysfunction.  He will receive vitamin B12 injection before the first dose of his treatment.  I will call his pharmacy with prescription for folic acid 1 mg p.o. daily in addition to Decadron 4 mg p.o. twice daily the day before, day of and day after the chemotherapy in addition to Compazine for nausea. He is expected to start the first cycle of this treatment in around 3 more weeks.  He will come back for follow-up visit  at that time. -Plan for a chemo education class prior to treatment initiation. -Monitor blood counts, kidney and liver function during treatment. -Follow-up every 4-6 months post-treatment.  Post-Surgical Concern Patient has a concern about a post-surgical area. -Recommend evaluation by the surgeon who performed the procedure.   The patient was advised to call immediately if he has any other concerning symptoms in the interval. The patient voices understanding of current disease status and treatment options and is in agreement with the current care plan.  All questions were answered. The patient knows to call the clinic with any problems, questions or concerns. We can certainly see the patient much sooner if necessary.  Thank you so much for allowing me to participate in the care of [employer]. I will continue to follow up the patient with you and assist in his care.  The total time spent in the appointment was 90 minutes.  Disclaimer: This note was dictated with voice recognition software. Similar sounding words can inadvertently be transcribed and may not be corrected upon review.   Lajuana Matte April 03, 2023, 2:28 PM

## 2023-04-04 ENCOUNTER — Other Ambulatory Visit: Payer: Self-pay

## 2023-04-05 ENCOUNTER — Other Ambulatory Visit: Payer: Self-pay

## 2023-04-06 ENCOUNTER — Telehealth: Payer: Self-pay | Admitting: Internal Medicine

## 2023-04-11 ENCOUNTER — Telehealth: Payer: Self-pay | Admitting: Internal Medicine

## 2023-04-11 ENCOUNTER — Telehealth: Payer: Self-pay | Admitting: Medical Oncology

## 2023-04-11 NOTE — Telephone Encounter (Signed)
Joshua [name] will speak to her father and cal back.

## 2023-04-11 NOTE — Telephone Encounter (Signed)
Patient's spouse and daughter have stated that this patient has decided not to move forward with treatment and has requested that all appointments be cancelled, message has been sent over to desk nurse regarding these cancellations

## 2023-04-12 NOTE — Progress Notes (Signed)
I received a VM on 10/30 from pt's sister, [name], that the pt is not going to move forward with any chemotherapy and that all his appts need to be cancelled. According to documentation in the pts chart from [name], RN, the pt's daughter [name] is going to speak to the pt and return a call to the clinic.

## 2023-04-13 NOTE — Progress Notes (Signed)
I reached out to the pts daughter to follow up on her phone call with Dr Pollie Friar nurse. I let her know I had received a call from the pt's sister, [name], regarding pt wanting to forego treatment. [name] states she's not sure why he was wanting to get it one day and not want to the next. [place]. [name] states she will talk to him more about it when she sees him next. Pts appts have not been cancelled as of now. I suggested [name] also reach out to Diane on Monday after she has spoken to her dad.

## 2023-04-16 ENCOUNTER — Telehealth: Payer: Self-pay

## 2023-04-16 ENCOUNTER — Telehealth: Payer: Self-pay | Admitting: Medical Oncology

## 2023-04-16 DIAGNOSIS — D518 Other vitamin B12 deficiency anemias: Secondary | ICD-10-CM | POA: Diagnosis not present

## 2023-04-16 NOTE — Progress Notes (Signed)
Pharmacist Chemotherapy Monitoring - Initial Assessment    Anticipated start date: 04/22/21   The following has been reviewed per standard work regarding the patient's treatment regimen: The patient's diagnosis, treatment plan and drug doses, and organ/hematologic function Lab orders and baseline tests specific to treatment regimen  The treatment plan start date, drug sequencing, and pre-medications Prior authorization status  Patient's documented medication list, including drug-drug interaction screen and prescriptions for anti-emetics and supportive care specific to the treatment regimen The drug concentrations, fluid compatibility, administration routes, and timing of the medications to be used The patient's access for treatment and lifetime cumulative dose history, if applicable  The patient's medication allergies and previous infusion related reactions, if applicable   Changes made to treatment plan:  N/A  Follow up needed:  N/A   Joshua [name], [facility], 04/16/2023  3:34 PM

## 2023-04-16 NOTE — Telephone Encounter (Signed)
Spoke with pt's mother, Joshua [name] about pt's appts. She states that Joshua [name] has chosen not to proceed with treatment at this time.

## 2023-04-16 NOTE — Telephone Encounter (Signed)
Mother , [place], called and said Joshua [name] is deaf. " Stop calling here with appts . [name] is not going to take any treatment".   I called mothers home phone and no answer.

## 2023-04-18 ENCOUNTER — Other Ambulatory Visit: Payer: Medicare Other

## 2023-04-18 ENCOUNTER — Ambulatory Visit: Payer: Medicare Other

## 2023-04-20 NOTE — Progress Notes (Unsigned)
     [address]       [place] [address]             [phone]    HPI: This is a 72 year old male who is s/p robotic assisted left video thoracoscopy lysis of adhesions, left upper lobe wedge resection, left upper  lobectomy, mediastinal lymph node sampling, and intercostal nerve block by Dr. Cliffton Asters on 03/14/2023. Pathology showed adenocarcinoma of the left upper lobe (TNM code:pT2b, pN0). Dr. Cliffton Asters has already seen him on 03/23/2023 and discussed the pathology. Dr. Cliffton Asters referred him to medical oncology. Patient presents today for post op follow up.    Current Outpatient Medications  Medication Sig Dispense Refill   albuterol (PROVENTIL HFA;VENTOLIN HFA) 108 (90 BASE) MCG/ACT inhaler Inhale 2 puffs into the lungs every 6 (six) hours as needed for wheezing or shortness of breath.      atorvastatin (LIPITOR) 20 MG tablet Take 20 mg by mouth daily.     cyclobenzaprine (FLEXERIL) 10 MG tablet Take 10 mg by mouth 3 (three) times daily as needed for muscle spasms.     [START ON 04/22/2023] dexamethasone (DECADRON) 4 MG tablet Take 1 tab 2 times daily starting day before pemetrexed. Then take 2 tabs daily x 3 days starting day after carboplatin. Take with food. 30 tablet 1   diazepam (VALIUM) 10 MG tablet Take 10 mg by mouth every 8 (eight) hours as needed for anxiety or sleep.     Fluticasone-Umeclidin-Vilant (TRELEGY ELLIPTA) 100-62.5-25 MCG/ACT AEPB One click each am 28 each 11   [START ON 04/22/2023] folic acid (FOLVITE) 1 MG tablet Take 1 tablet (1 mg total) by mouth daily. Start 7 days before pemetrexed chemotherapy. Continue until 21 days after pemetrexed completed. 100 tablet 3   gabapentin (NEURONTIN) 300 MG capsule Take 300 mg by mouth 3 (three) times daily.     nabumetone (RELAFEN) 500 MG tablet Take 500 mg by mouth in the morning, at noon, and at bedtime.     oxyCODONE (ROXICODONE) 15 MG immediate release tablet Take 1 tablet (15 mg total) by mouth  every 6 (six) hours as needed for pain. 50 tablet 0   oxymetazoline (AFRIN) 0.05 % nasal spray Place 1 spray into both nostrils 2 (two) times daily.     [START ON 04/22/2023] prochlorperazine (COMPAZINE) 10 MG tablet Take 1 tablet (10 mg total) by mouth every 6 (six) hours as needed for nausea or vomiting. 30 tablet 1  Vital Signs:   Physical Exam: CV Pulmonary Extremities Wounds  Diagnostic Tests: ***  Impression and Plan: We reviewed today's chest ray. He has already been seen by Dr. Pietro Cassis. In summary of Dr. Sharlene Dory consultation, patient was given the patient the option of close monitoring and observation versus consideration of 4 cycles of adjuvant systemic chemotherapy. Per Dr. Gwenyth Bouillon, the patient is not a great candidate for adjuvant  treatment with cisplatin because of his renal insufficiency as well as the significant hearing deficit. The patient and his sister were interested in the treatment. Patient will be seen by [facility] PRN and closely followed by Dr. Pietro Cassis.    Ardelle Balls, PA-C [facility] [phone]

## 2023-04-22 ENCOUNTER — Other Ambulatory Visit: Payer: Self-pay

## 2023-04-23 ENCOUNTER — Ambulatory Visit: Payer: Medicare Other | Admitting: Internal Medicine

## 2023-04-23 ENCOUNTER — Other Ambulatory Visit: Payer: Medicare Other

## 2023-04-23 ENCOUNTER — Ambulatory Visit: Payer: Medicare Other

## 2023-04-24 ENCOUNTER — Other Ambulatory Visit: Payer: Self-pay | Admitting: Thoracic Surgery (Cardiothoracic Vascular Surgery)

## 2023-04-24 DIAGNOSIS — R911 Solitary pulmonary nodule: Secondary | ICD-10-CM

## 2023-04-25 ENCOUNTER — Ambulatory Visit (INDEPENDENT_AMBULATORY_CARE_PROVIDER_SITE_OTHER): Payer: Self-pay | Admitting: Physician Assistant

## 2023-04-25 ENCOUNTER — Ambulatory Visit
Admission: RE | Admit: 2023-04-25 | Discharge: 2023-04-25 | Disposition: A | Discharge status: Home / Self Care | Payer: Medicare Other | Source: Ambulatory Visit | Attending: Thoracic Surgery (Cardiothoracic Vascular Surgery) | Admitting: Thoracic Surgery (Cardiothoracic Vascular Surgery)

## 2023-04-25 VITALS — BP 119/71 | HR 89 | Resp 18 | Ht 68.0 in | Wt 178.0 lb

## 2023-04-25 DIAGNOSIS — Z902 Acquired absence of lung [part of]: Secondary | ICD-10-CM

## 2023-04-25 DIAGNOSIS — R911 Solitary pulmonary nodule: Secondary | ICD-10-CM

## 2023-04-25 DIAGNOSIS — J984 Other disorders of lung: Secondary | ICD-10-CM | POA: Diagnosis not present

## 2023-04-26 ENCOUNTER — Encounter: Payer: Self-pay | Admitting: Physician Assistant

## 2023-04-27 ENCOUNTER — Telehealth: Payer: Self-pay | Admitting: Medical Oncology

## 2023-04-27 NOTE — Telephone Encounter (Signed)
Pt needed time to think about getting treatment .  [place] said he wants an  appt with Dr Arbutus Ped  to hear about options.

## 2023-05-04 ENCOUNTER — Other Ambulatory Visit: Payer: Self-pay

## 2023-05-04 DIAGNOSIS — C7A090 Malignant carcinoid tumor of the bronchus and lung: Secondary | ICD-10-CM | POA: Diagnosis not present

## 2023-05-04 DIAGNOSIS — C3492 Malignant neoplasm of unspecified part of left bronchus or lung: Secondary | ICD-10-CM | POA: Diagnosis not present

## 2023-05-04 DIAGNOSIS — Z13220 Encounter for screening for lipoid disorders: Secondary | ICD-10-CM | POA: Diagnosis not present

## 2023-05-04 DIAGNOSIS — Z131 Encounter for screening for diabetes mellitus: Secondary | ICD-10-CM | POA: Diagnosis not present

## 2023-05-04 DIAGNOSIS — M961 Postlaminectomy syndrome, not elsewhere classified: Secondary | ICD-10-CM | POA: Diagnosis not present

## 2023-05-04 DIAGNOSIS — Z79899 Other long term (current) drug therapy: Secondary | ICD-10-CM | POA: Diagnosis not present

## 2023-05-08 DIAGNOSIS — Z79899 Other long term (current) drug therapy: Secondary | ICD-10-CM | POA: Diagnosis not present

## 2023-05-12 DIAGNOSIS — E782 Mixed hyperlipidemia: Secondary | ICD-10-CM | POA: Diagnosis not present

## 2023-05-12 DIAGNOSIS — J449 Chronic obstructive pulmonary disease, unspecified: Secondary | ICD-10-CM | POA: Diagnosis not present

## 2023-05-14 ENCOUNTER — Other Ambulatory Visit: Payer: Medicare Other

## 2023-05-14 ENCOUNTER — Ambulatory Visit: Payer: Medicare Other | Admitting: Physician Assistant

## 2023-05-14 ENCOUNTER — Ambulatory Visit: Payer: Medicare Other

## 2023-05-15 ENCOUNTER — Other Ambulatory Visit: Payer: Self-pay

## 2023-05-27 NOTE — Progress Notes (Deleted)
[facility] OFFICE PROGRESS NOTE  Joshua Nevins, MD [address] [place] [address]  DIAGNOSIS: Stage 2A (T2b, N0, M0) non-small cell lung cancer, adenocarcinoma. He was diagnosed in October of 2024. He presented with a left upper lobe lung lesion.   Molecular Studies: No actionable mutations  PDL1 Expression: Negative  PRIOR THERAPY: left upper lobe wedge resection, left upper  lobectomy, mediastinal lymph node sampling by Dr. Cliffton Asters on 03/14/23. He declined adjuvant chemotherapy   CURRENT THERAPY: Observation   INTERVAL HISTORY: Joshua [name] 72 y.o. male returns to the clinic today for follow-up visit.  The patient establish care in the clinic with Dr. Arbutus Ped on 04/03/2023.  At that point in time, the patient was status post left upper lobectomy for his non-small cell lung cancer.  Given the size of the tumor, Dr. Arbutus Ped discussed the option of close monitoring and observation versus 4 cycles of adjuvant systemic chemotherapy.  Of note, the patient is deaf.  The history was obtained by ***  The patient eventually opted to not proceed with any adjuvant chemotherapy.  Today the patient denies any major changes in his health.  Denies any fever, chills, night sweats, or unexplained weight loss.  Denies any chest pain, shortness of breath, cough, or hemoptysis.  Denies any nausea, vomiting, diarrhea, or constipation.  Denies any headache or visual changes.  The patient is here today for evaluation and to discuss his neck steps in his care.    MEDICAL HISTORY: Past Medical History:  Diagnosis Date   Arthritis    Back pain    Cancer (HCC)    bladder in the past    Chronic pain    COPD (chronic obstructive pulmonary disease) (HCC)    HOH (hard of hearing)    VERY   Meniere's disease    PONV (postoperative nausea and vomiting)    Pulmonary nodule, left    Stroke (HCC)    30 years ago    ALLERGIES:  has no known allergies.  MEDICATIONS:  Current  Outpatient Medications  Medication Sig Dispense Refill   albuterol (PROVENTIL HFA;VENTOLIN HFA) 108 (90 BASE) MCG/ACT inhaler Inhale 2 puffs into the lungs every 6 (six) hours as needed for wheezing or shortness of breath.      atorvastatin (LIPITOR) 20 MG tablet Take 20 mg by mouth daily.     cyclobenzaprine (FLEXERIL) 10 MG tablet Take 10 mg by mouth 3 (three) times daily as needed for muscle spasms.     dexamethasone (DECADRON) 4 MG tablet Take 1 tab 2 times daily starting day before pemetrexed. Then take 2 tabs daily x 3 days starting day after carboplatin. Take with food. (Patient not taking: Reported on 04/25/2023) 30 tablet 1   diazepam (VALIUM) 10 MG tablet Take 10 mg by mouth every 8 (eight) hours as needed for anxiety or sleep.     Fluticasone-Umeclidin-Vilant (TRELEGY ELLIPTA) 100-62.5-25 MCG/ACT AEPB One click each am 28 each 11   folic acid (FOLVITE) 1 MG tablet Take 1 tablet (1 mg total) by mouth daily. Start 7 days before pemetrexed chemotherapy. Continue until 21 days after pemetrexed completed. 100 tablet 3   gabapentin (NEURONTIN) 300 MG capsule Take 300 mg by mouth 3 (three) times daily.     nabumetone (RELAFEN) 500 MG tablet Take 500 mg by mouth in the morning, at noon, and at bedtime.     oxyCODONE (ROXICODONE) 15 MG immediate release tablet Take 1 tablet (15 mg total) by mouth every 6 (  six) hours as needed for pain. 50 tablet 0   oxymetazoline (AFRIN) 0.05 % nasal spray Place 1 spray into both nostrils 2 (two) times daily.     prochlorperazine (COMPAZINE) 10 MG tablet Take 1 tablet (10 mg total) by mouth every 6 (six) hours as needed for nausea or vomiting. (Patient not taking: Reported on 04/25/2023) 30 tablet 1   No current facility-administered medications for this visit.    SURGICAL HISTORY:  Past Surgical History:  Procedure Laterality Date   BACK SURGERY  06/03/2018   spinal cord stimulator insertions   BLADDER SURGERY     HYDROCELE EXCISION Right 11/26/2018    Procedure: RIGHT HYDROCELECTOMY;  Surgeon: Marcine Matar, MD;  Location: [facility];  Service: Urology;  Laterality: Right;   INTERCOSTAL NERVE BLOCK Left 03/14/2023   Procedure: INTERCOSTAL NERVE BLOCK;  Surgeon: Corliss Skains, MD;  Location: [facility];  Service: Thoracic;  Laterality: Left;   NASAL SINUS SURGERY     NODE DISSECTION Left 03/14/2023   Procedure: NODE DISSECTION;  Surgeon: Corliss Skains, MD;  Location: [facility];  Service: Thoracic;  Laterality: Left;    REVIEW OF SYSTEMS:   Review of Systems  Constitutional: Negative for appetite change, chills, fatigue, fever and unexpected weight change.  HENT:   Negative for mouth sores, nosebleeds, sore throat and trouble swallowing.   Eyes: Negative for eye problems and icterus.  Respiratory: Negative for cough, hemoptysis, shortness of breath and wheezing.   Cardiovascular: Negative for chest pain and leg swelling.  Gastrointestinal: Negative for abdominal pain, constipation, diarrhea, nausea and vomiting.  Genitourinary: Negative for bladder incontinence, difficulty urinating, dysuria, frequency and hematuria.   Musculoskeletal: Negative for back pain, gait problem, neck pain and neck stiffness.  Skin: Negative for itching and rash.  Neurological: Negative for dizziness, extremity weakness, gait problem, headaches, light-headedness and seizures.  Hematological: Negative for adenopathy. Does not bruise/bleed easily.  Psychiatric/Behavioral: Negative for confusion, depression and sleep disturbance. The patient is not nervous/anxious.     PHYSICAL EXAMINATION:  There were no vitals taken for this visit.  ECOG PERFORMANCE STATUS: {CHL ONC ECOG [id number]  Physical Exam  Constitutional: Oriented to person, place, and time and well-developed, well-nourished, and in no distress. No distress.  HENT:  Head: Normocephalic and atraumatic.  Mouth/Throat: Oropharynx is clear and moist. No oropharyngeal exudate.  Eyes:  Conjunctivae are normal. Right eye exhibits no discharge. Left eye exhibits no discharge. No scleral icterus.  Neck: Normal range of motion. Neck supple.  Cardiovascular: Normal rate, regular rhythm, normal heart sounds and intact distal pulses.   Pulmonary/Chest: Effort normal and breath sounds normal. No respiratory distress. No wheezes. No rales.  Abdominal: Soft. Bowel sounds are normal. Exhibits no distension and no mass. There is no tenderness.  Musculoskeletal: Normal range of motion. Exhibits no edema.  Lymphadenopathy:    No cervical adenopathy.  Neurological: Alert and oriented to person, place, and time. Exhibits normal muscle tone. Gait normal. Coordination normal.  Skin: Skin is warm and dry. No rash noted. Not diaphoretic. No erythema. No pallor.  Psychiatric: Mood, memory and judgment normal.  Vitals reviewed.  LABORATORY DATA: Lab Results  Component Value Date   WBC 9.1 04/03/2023   HGB 10.9 (L) 04/03/2023   HCT 32.9 (L) 04/03/2023   MCV 89.2 04/03/2023   PLT 371 04/03/2023      Chemistry      Component Value Date/Time   NA 140 04/03/2023 1402   NA 138 03/09/2022 1547   K  3.6 04/03/2023 1402   CL 106 04/03/2023 1402   CO2 28 04/03/2023 1402   BUN 15 04/03/2023 1402   BUN 13 03/09/2022 1547   CREATININE 1.01 04/03/2023 1402      Component Value Date/Time   CALCIUM 9.4 04/03/2023 1402   ALKPHOS 44 04/03/2023 1402   AST 17 04/03/2023 1402   ALT 15 04/03/2023 1402   BILITOT 0.5 04/03/2023 1402       RADIOGRAPHIC STUDIES:  No results found.   ASSESSMENT/PLAN:  This is a very pleasant 73 year old Caucasian male with significant hearing deficit diagnosed with stage IIa (T2b, N0, M0) non-small cell lung cancer, adenocarcinoma.  The patient presented with a left upper lobe lung lesion measuring 4.2 x 2.2 x 1.5 cm.  He was diagnosed in October 2024.  His molecular study showed no actionable mutation and his PD-L1 expression is 0%.  The patient is status pos  left upper lobe resection by Dr. Cliffton Asters on 03/14/2023.  The patient was seen by Dr. Arbutus Ped on 04/03/2023 and was given the option of close surveillance versus adjuvant chemotherapy for 4 cycles.  The patient declined chemotherapy.  He is here today to get back on track with next steps with surveillance.  The patient was seen with Dr. Arbutus Ped today.  Dr. Arbutus Ped would recommend close monitoring CT scans every ***months.  I will arrange for the next CT scan on ***  We will see him back for follow-up visit 1 week after his CT scan to review the results.  The patient was advised to call immediately if he has any concerning symptoms in the interval. The patient voices understanding of current disease status and treatment options and is in agreement with the current care plan. All questions were answered. The patient knows to call the clinic with any problems, questions or concerns. We can certainly see the patient much sooner if necessary   No orders of the defined types were placed in this encounter.    I spent {CHL ONC TIME VISIT - XBMWU:[id number]} counseling the patient face to face. The total time spent in the appointment was {CHL ONC TIME VISIT - OZDGU:[id number]}.  Sha Burling L Kaslyn [place], PA-C 05/27/23

## 2023-05-29 ENCOUNTER — Encounter: Payer: Self-pay | Admitting: Internal Medicine

## 2023-05-29 ENCOUNTER — Other Ambulatory Visit: Payer: Self-pay | Admitting: Physician Assistant

## 2023-05-29 DIAGNOSIS — C3412 Malignant neoplasm of upper lobe, left bronchus or lung: Secondary | ICD-10-CM

## 2023-05-30 ENCOUNTER — Inpatient Hospital Stay: Payer: Medicare Other | Attending: Internal Medicine

## 2023-05-30 ENCOUNTER — Inpatient Hospital Stay: Payer: Medicare Other | Admitting: Physician Assistant

## 2023-05-31 DIAGNOSIS — J449 Chronic obstructive pulmonary disease, unspecified: Secondary | ICD-10-CM | POA: Diagnosis not present

## 2023-05-31 DIAGNOSIS — C679 Malignant neoplasm of bladder, unspecified: Secondary | ICD-10-CM | POA: Diagnosis not present

## 2023-06-04 DIAGNOSIS — C7A090 Malignant carcinoid tumor of the bronchus and lung: Secondary | ICD-10-CM | POA: Diagnosis not present

## 2023-06-04 DIAGNOSIS — C3492 Malignant neoplasm of unspecified part of left bronchus or lung: Secondary | ICD-10-CM | POA: Diagnosis not present

## 2023-06-04 DIAGNOSIS — M961 Postlaminectomy syndrome, not elsewhere classified: Secondary | ICD-10-CM | POA: Diagnosis not present

## 2023-06-04 DIAGNOSIS — Z79899 Other long term (current) drug therapy: Secondary | ICD-10-CM | POA: Diagnosis not present

## 2023-06-10 ENCOUNTER — Other Ambulatory Visit: Payer: Self-pay

## 2023-06-14 ENCOUNTER — Other Ambulatory Visit: Payer: Self-pay

## 2023-06-14 DIAGNOSIS — C3490 Malignant neoplasm of unspecified part of unspecified bronchus or lung: Secondary | ICD-10-CM

## 2023-06-15 ENCOUNTER — Other Ambulatory Visit: Payer: Self-pay

## 2023-06-20 NOTE — Progress Notes (Signed)
      [address]       [place] [address]             [phone]       

## 2023-06-20 NOTE — Progress Notes (Signed)
 [address]       [place] [address]             [phone]       HPI: Joshua [name] is a 73 year old male who is s/p robotic assisted left video thoracoscopy lysis of adhesions, left upper lobe wedge resection, left upper  lobectomy, mediastinal lymph node sampling, and intercostal nerve block by Dr. Deloise Ferries on 03/14/2023. Pathology showed adenocarcinoma of the left upper lobe (TNM code:pT2b, pN0).  He has done well post operatively.  However, he has decided to not receive any further treatment.  He presents today for repeat CXR.  The patient looks great.  He is accompanied by his sister as he is deaf.  He continues to have pain along his left chest however after surgery he rebuilt a porch and used a chain saw etc.  He is willing to consider chemo if it is absolutely necessary.  However he would really like to hear the term "he is cancer free."  He also states that should he undergo therapy he would like to receive it in [place] to help reduce burden on family helping him.  Current Outpatient Medications  Medication Sig Dispense Refill   albuterol  (PROVENTIL  HFA;VENTOLIN  HFA) 108 (90 BASE) MCG/ACT inhaler Inhale 2 puffs into the lungs every 6 (six) hours as needed for wheezing or shortness of breath.      atorvastatin  (LIPITOR) 20 MG tablet Take 20 mg by mouth daily.     cyclobenzaprine  (FLEXERIL ) 10 MG tablet Take 10 mg by mouth 3 (three) times daily as needed for muscle spasms.     dexamethasone  (DECADRON ) 4 MG tablet Take 1 tab 2 times daily starting day before pemetrexed. Then take 2 tabs daily x 3 days starting day after carboplatin. Take with food. (Patient not taking: Reported on 04/25/2023) 30 tablet 1   diazepam  (VALIUM ) 10 MG tablet Take 10 mg by mouth every 8 (eight) hours as needed for anxiety or sleep.     Fluticasone -Umeclidin-Vilant (TRELEGY ELLIPTA ) 100-62.5-25 MCG/ACT AEPB One click each am 28 each 11   folic acid  (FOLVITE ) 1 MG tablet Take 1 tablet (1  mg total) by mouth daily. Start 7 days before pemetrexed chemotherapy. Continue until 21 days after pemetrexed completed. 100 tablet 3   gabapentin  (NEURONTIN ) 300 MG capsule Take 300 mg by mouth 3 (three) times daily.     nabumetone  (RELAFEN ) 500 MG tablet Take 500 mg by mouth in the morning, at noon, and at bedtime.     oxyCODONE  (ROXICODONE ) 15 MG immediate release tablet Take 1 tablet (15 mg total) by mouth every 6 (six) hours as needed for pain. 50 tablet 0   oxymetazoline (AFRIN) 0.05 % nasal spray Place 1 spray into both nostrils 2 (two) times daily.     prochlorperazine  (COMPAZINE ) 10 MG tablet Take 1 tablet (10 mg total) by mouth every 6 (six) hours as needed for nausea or vomiting. (Patient not taking: Reported on 04/25/2023) 30 tablet 1   No current facility-administered medications for this visit.    Physical Exam:  BP 118/72   Pulse 70   Resp 18   Ht 5\' 8"  (1.727 m)   Wt 179 lb (81.2 kg)   SpO2 99% Comment: RA  BMI 27.22 kg/m   Gen: NAD Heart RRR Lungs: CTA bilaterally Incision: well healed  Diagnostic Tests:  CXR:No significant change from xray from 11/13  A/P:  S/p robotic assisted left video thoracoscopy  lysis of adhesions, left upper lobe wedge resection, left upper  lobectomy, mediastinal lymph node sampling, and intercostal nerve block by Dr. Deloise Ferries on 03/14/2023- doing well post operatively.  He does not wish to pursue further treatment.  However he would like monitoring of his disease..   Plan: will get patient set up with Dr. Liam Redhead for repeat imaging in Feb/March this would be a 4-6  month scan from his surgery.  At that time he can again discuss possibility of adjuvant therapy.  RTC prn Gates Kasal, PA-C [facility] [phone]

## 2023-06-27 ENCOUNTER — Ambulatory Visit
Admission: RE | Admit: 2023-06-27 | Discharge: 2023-06-27 | Disposition: A | Discharge status: Home / Self Care | Payer: Medicare Other | Source: Ambulatory Visit | Attending: Thoracic Surgery (Cardiothoracic Vascular Surgery) | Admitting: Thoracic Surgery (Cardiothoracic Vascular Surgery)

## 2023-06-27 ENCOUNTER — Ambulatory Visit: Payer: Medicare Other

## 2023-06-27 VITALS — BP 118/72 | HR 70 | Resp 18 | Ht 68.0 in | Wt 179.0 lb

## 2023-06-27 DIAGNOSIS — Z85118 Personal history of other malignant neoplasm of bronchus and lung: Secondary | ICD-10-CM | POA: Diagnosis not present

## 2023-06-27 DIAGNOSIS — C3490 Malignant neoplasm of unspecified part of unspecified bronchus or lung: Secondary | ICD-10-CM

## 2023-06-27 DIAGNOSIS — Z902 Acquired absence of lung [part of]: Secondary | ICD-10-CM | POA: Diagnosis not present

## 2023-06-27 DIAGNOSIS — J439 Emphysema, unspecified: Secondary | ICD-10-CM | POA: Diagnosis not present

## 2023-06-27 DIAGNOSIS — R079 Chest pain, unspecified: Secondary | ICD-10-CM | POA: Diagnosis not present

## 2023-07-04 ENCOUNTER — Other Ambulatory Visit: Payer: Self-pay | Admitting: Internal Medicine

## 2023-07-04 DIAGNOSIS — C3490 Malignant neoplasm of unspecified part of unspecified bronchus or lung: Secondary | ICD-10-CM

## 2023-07-04 NOTE — Progress Notes (Signed)
I reached out to pts sister, Arline Asp, to let her know that the pts cancer will be actively monitored with regular CT scans and follow ups with Dr Arbutus Ped. Cindy verbalized understanding. I let her know that a CT order has been placed for the pt to schedule the week of 4/14 with a same day lab appt, and a f/u appt with Dr Arbutus Ped the week of 4/21. Cindy verbalized understanding. No questions at the conclusion of our encounter.

## 2023-07-05 DIAGNOSIS — C3492 Malignant neoplasm of unspecified part of left bronchus or lung: Secondary | ICD-10-CM | POA: Diagnosis not present

## 2023-07-05 DIAGNOSIS — M961 Postlaminectomy syndrome, not elsewhere classified: Secondary | ICD-10-CM | POA: Diagnosis not present

## 2023-07-05 DIAGNOSIS — C7A090 Malignant carcinoid tumor of the bronchus and lung: Secondary | ICD-10-CM | POA: Diagnosis not present

## 2023-07-06 ENCOUNTER — Telehealth: Payer: Self-pay | Admitting: Internal Medicine

## 2023-07-06 NOTE — Telephone Encounter (Signed)
Left patient sister Arline Asp) a voicemail in regards to scheduled appointment times/dates for follow up; was able to connect with patient's mother ([place]) in regards to scheduling and patient's mother stated they would reach out regarding scheduling future scans as well

## 2023-07-07 ENCOUNTER — Other Ambulatory Visit: Payer: Self-pay

## 2023-07-09 DIAGNOSIS — Z79899 Other long term (current) drug therapy: Secondary | ICD-10-CM | POA: Diagnosis not present

## 2023-07-19 DIAGNOSIS — R0789 Other chest pain: Secondary | ICD-10-CM | POA: Diagnosis not present

## 2023-07-19 DIAGNOSIS — J449 Chronic obstructive pulmonary disease, unspecified: Secondary | ICD-10-CM | POA: Diagnosis not present

## 2023-07-19 DIAGNOSIS — M1991 Primary osteoarthritis, unspecified site: Secondary | ICD-10-CM | POA: Diagnosis not present

## 2023-07-19 DIAGNOSIS — Z6824 Body mass index (BMI) 24.0-24.9, adult: Secondary | ICD-10-CM | POA: Diagnosis not present

## 2023-07-19 DIAGNOSIS — M5414 Radiculopathy, thoracic region: Secondary | ICD-10-CM | POA: Diagnosis not present

## 2023-07-26 NOTE — Progress Notes (Signed)
Rescheduled

## 2023-08-06 DIAGNOSIS — R935 Abnormal findings on diagnostic imaging of other abdominal regions, including retroperitoneum: Secondary | ICD-10-CM | POA: Diagnosis not present

## 2023-08-06 DIAGNOSIS — Z20822 Contact with and (suspected) exposure to covid-19: Secondary | ICD-10-CM | POA: Diagnosis not present

## 2023-08-06 DIAGNOSIS — K529 Noninfective gastroenteritis and colitis, unspecified: Secondary | ICD-10-CM | POA: Diagnosis not present
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
## 2023-08-13 DIAGNOSIS — M961 Postlaminectomy syndrome, not elsewhere classified: Secondary | ICD-10-CM | POA: Diagnosis not present

## 2023-08-13 DIAGNOSIS — Z79899 Other long term (current) drug therapy: Secondary | ICD-10-CM | POA: Diagnosis not present

## 2023-08-13 DIAGNOSIS — C3492 Malignant neoplasm of unspecified part of left bronchus or lung: Secondary | ICD-10-CM | POA: Diagnosis not present

## 2023-08-14 ENCOUNTER — Telehealth: Payer: Self-pay | Admitting: Internal Medicine

## 2023-08-14 ENCOUNTER — Ambulatory Visit: Payer: Medicare Other | Admitting: Internal Medicine
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
## 2023-08-15 DIAGNOSIS — Z6824 Body mass index (BMI) 24.0-24.9, adult: Secondary | ICD-10-CM | POA: Diagnosis not present

## 2023-08-15 DIAGNOSIS — M1991 Primary osteoarthritis, unspecified site: Secondary | ICD-10-CM | POA: Diagnosis not present

## 2023-08-15 DIAGNOSIS — G894 Chronic pain syndrome: Secondary | ICD-10-CM | POA: Diagnosis not present

## 2023-08-15 DIAGNOSIS — M5414 Radiculopathy, thoracic region: Secondary | ICD-10-CM | POA: Diagnosis not present

## 2023-08-15 DIAGNOSIS — R0789 Other chest pain: Secondary | ICD-10-CM | POA: Diagnosis not present

## 2023-08-15 DIAGNOSIS — J449 Chronic obstructive pulmonary disease, unspecified: Secondary | ICD-10-CM | POA: Diagnosis not present

## 2023-08-16 DIAGNOSIS — Z79899 Other long term (current) drug therapy: Secondary | ICD-10-CM | POA: Diagnosis not present

## 2023-08-30 NOTE — Progress Notes (Signed)
 Pts sister, Arline Asp, reached out to me to review and confirm pt's upcoming appts. Labs at 11:45am and [facility] followed by Chest CT at [facility] radiology at 1:00pm. I suggested pt check in for his CT at 12:30. Let Arline Asp know that the pt has a f/u with Dr Arbutus Ped to review his scan on 4/24 at 11:30. Arline Asp wrote down the appts and read them back to me. No other questions.

## 2023-09-08 NOTE — Progress Notes (Unsigned)
 Joshua [name], male    DOB: 1951-01-29    MRN: [id number]   Brief patient profile:  89  yowm  quit smoking  xmas 2024 MM  self referred to pulmonary clinic in [facility]  03/09/2022 for doe/cough     History of Present Illness  03/09/2022  Pulmonary/ 1st office eval/ Sherene Sires / [facility]  trelegy 200 prn  Chief Complaint  Patient presents with   Consult    Hx of pneumonia. Cough, SOB.   Dyspnea:  denies limitation and not using any maint meds at this point Apparently no cough now nor cp but keeps talking about past symptoms he wants checked out  eg pain on R chest  > ER 07/18/21 for minimal scarring R  minor fissure  No further hx available from pt even with sister's help  Rec Plan A = Automatic = Always=   Trelegy 100 one click first thing each AM (not as needed)  Work on inhaler technique:   Plan B = Backup (to supplement plan A, not to replace it) Only use your albuterol inhaler as a rescue medication  For cough > mucinex dm 1200 mg every 12 hours as needed  If cough continues rec Try prilosec otc 20mg   Take 30-60 min before first meal of the day and Pepcid ac (famotidine) 20 mg one @  bedtime until cough is completely gone for at least a week without the need for cough suppression The key is to stop smoking completely before smoking completely stops you!  Cxr nad    Please schedule a follow up office visit in 4 weeks, sooner if needed  with all medications /inhalers/ solutions in hand        05/11/2022  f/u ov/Toini Failla re: GOLD 3 COPD  GOLD 3 not following recs for bevespi  Chief Complaint  Patient presents with   Follow-up    Breathing is about the same since last ov wants to discuss changing inhaler    Dyspnea:  no change activity tol Cough: none  Sleeping: cough/ breathing not keeping in up SABA use: confused how/ when to use vs bevespi since they are both inhalers  02: none Lung cancer screening :  referred .05/11/2022   Rec Plan A = Automatic = Always=    BEVESPI  2  puffs every 12 hours (tends to cause less cough) or  Trelegy 100 one click each am  Plan B = Backup (to supplement plan A, not to replace it) Only use your albuterol inhaler as a rescue medication  For cough  >>>  mucinex dm 1200 mg every 12 hours as needed       08/11/2022  f/u ov/[place] [facility]/Lida Berkery re: GOLD 3 copd  maint on trelegy  100, still smoking  Chief Complaint  Patient presents with   Follow-up    Breathing okay since last visit    Dyspnea:  not limited  from breathing / back slows him down / does treadmill x one- half mile  x 10-15 min flat grade  Cough: none  Sleeping: bed is level with one pillow / no resp  SABA use: not using  02: none  Covid status: no vax this year/ never infected Lung cancer screening: 06/30/22 see below Rec No change rx   03/14/23  s/p robotic assisted left video thoracoscopy lysis of adhesions, left upper lobe wedge resection, left upper  lobectomy, mediastinal lymph node sampling, and intercostal nerve block by Dr. 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  09/11/2023  f/u ov/[place] [facility]/Azyah Flett re: GOLD 3  maint on Trelegy one click daily   Chief Complaint  Patient presents with   Follow-up    No new concerns   Dyspnea:  limited by back > breathing s tendency to aecopd since last ov  Cough: no  Sleeping: level bed one pillow    resp cc  SABA use: flat bed / three pillows  02: none     No obvious day to day or daytime variability or assoc excess/ purulent sputum or mucus plugs or hemoptysis or cp or chest tightness, subjective wheeze or overt sinus or hb symptoms.    Also denies any obvious fluctuation of symptoms with weather or environmental changes or other aggravating or alleviating factors except as outlined above   No unusual exposure hx or h/o childhood pna/ asthma or knowledge of premature birth.  Current Allergies, Complete Past Medical History, Past  Surgical History, Family History, and Social History were reviewed in [employer] record.  ROS  The following are not active complaints unless bolded Hoarseness, sore throat, dysphagia, dental problems, itching, sneezing,  nasal congestion or discharge of excess mucus or purulent secretions, ear ache,   fever, chills, sweats, unintended wt loss or wt gain, classically pleuritic or exertional cp,  orthopnea pnd or arm/hand swelling  or leg swelling, presyncope, palpitations, abdominal pain, anorexia, nausea, vomiting, diarrhea  or change in bowel habits or change in bladder habits, change in stools or change in urine, dysuria, hematuria,  rash, arthralgias, visual complaints, headache, numbness, weakness or ataxia or problems with walking or coordination,  change in mood or  memory. Extremely hard of hearing         Current Meds  Medication Sig   atorvastatin (LIPITOR) 20 MG tablet Take 20 mg by mouth daily.   cyclobenzaprine (FLEXERIL) 10 MG tablet Take 10 mg by mouth 3 (three) times daily as needed for muscle spasms.   diazepam (VALIUM) 10 MG tablet Take 10 mg by mouth every 8 (eight) hours as needed for anxiety or sleep.   Fluticasone-Umeclidin-Vilant (TRELEGY ELLIPTA) 100-62.5-25 MCG/ACT AEPB One click each am   folic acid (FOLVITE) 1 MG tablet Take 1 tablet (1 mg total) by mouth daily. Start 7 days before pemetrexed chemotherapy. Continue until 21 days after pemetrexed completed.   gabapentin (NEURONTIN) 300 MG capsule Take 300 mg by mouth 3 (three) times daily.   nabumetone (RELAFEN) 500 MG tablet Take 500 mg by mouth in the morning, at noon, and at bedtime.   oxyCODONE-acetaminophen (PERCOCET) 10-325 MG tablet Take 0.5-1 tablets by mouth 4 (four) times daily as needed.   oxymetazoline (AFRIN) 0.05 % nasal spray Place 1 spray into both nostrils 2 (two) times daily.   [DISCONTINUED] oxyCODONE (ROXICODONE) 15 MG immediate release tablet Take 1 tablet (15 mg total) by mouth  every 6 (six) hours as needed for pain.              Past Medical History:  Diagnosis Date   Back pain    Cancer (HCC)    bladder in the past    Chronic pain    COPD (chronic obstructive pulmonary disease) (HCC)    Meniere's disease    PONV (postoperative nausea and vomiting)    Stroke (HCC)    30 years ago        Objective:    Wts  09/11/2023  180  08/11/2022         179  05/11/2022     177  04/11/22 179 lb 9.6 oz (81.5 kg)  03/09/22 183 lb 1.6 oz (83.1 kg)  11/26/18 159 lb 13.3 oz (72.5 kg)   Vital signs reviewed  09/11/2023  - Note at rest 02 sats  95% on RA   General appearance:    amb elderly wm extremely poor hearing      HEENT : Oropharynx  clear/ edentulous  Nasal turbinates nl    NECK :  without  apparent JVD/ palpable Nodes/TM    LUNGS: no acc muscle use,  Mild barrel  contour chest wall with bilateral  Distant bs s audible wheeze and  without cough on insp or exp maneuvers  and mild  Hyperresonant  to  percussion bilaterally     CV:  RRR  no s3 or murmur or increase in P2, and no edema   ABD:  soft and nontender with pos end  insp Hoover's  in the supine position.  No bruits or organomegaly appreciated   MS:  Nl gait/ ext warm without deformities Or obvious joint restrictions  calf tenderness, cyanosis or clubbing     SKIN: warm and dry without lesions    NEURO:  alert, approp, nl sensorium with  no motor or cerebellar deficits apparent.                 Assessment

## 2023-09-11 ENCOUNTER — Ambulatory Visit: Admitting: Internal Medicine

## 2023-09-11 ENCOUNTER — Encounter: Payer: Self-pay | Admitting: Internal Medicine

## 2023-09-11 VITALS — BP 111/64 | HR 71 | Ht 68.0 in | Wt 180.0 lb

## 2023-09-11 DIAGNOSIS — J449 Chronic obstructive pulmonary disease, unspecified: Secondary | ICD-10-CM | POA: Diagnosis not present

## 2023-09-11 NOTE — Assessment & Plan Note (Addendum)
 Active smoker?MM  - 03/09/2022   Walked on RA   x  3  lap(s) =  approx 450  ft  @ mod pace, stopped due to end of study  with lowest 02 sats 92% and no sob   - 04/11/2022 d/c trelegy and start bevespi due to recurrent "pna " - Spirometry 04/11/2022  FEV1 1.48 (44%)  Ratio 0.48 prior to study with classic concavity  -  04/11/2022  :     alpha one AT phenotype  MM level 197/  E0S 0  - 05/11/2022  After extensive coaching inhaler device,  effectiveness =    90% with dpi > change to trelegy 100 each am now that cough is not his primary concern - PFT's  02/01/23  FEV1 2.11 (62 % ) ratio 0.47  p 12 % improvement from saba p Trelegy 100  prior to study with DLCO  15.9 (59%)   and FV curve severely concave  - 03/14/23  s/p robotic assisted left video thoracoscopy  left upper  lobectomy,   Group D (now reclassified as E) in terms of symptom/risk and laba/lama/ICS  therefore appropriate rx at this point >>>  trelegy 100 and prn saba.    Not limited by doe cause very sedentary due to chronic back pain > no additional rx needed  F/u 12  m, sooner prn          Each maintenance medication was reviewed in detail including emphasizing most importantly the difference between maintenance and prns and under what circumstances the prns are to be triggered using an action plan format where appropriate.  Total time for H and P, chart review, counseling, reviewing dpi/hfa  device(s) and generating customized AVS unique to this office visit / same day charting = 26 min

## 2023-09-11 NOTE — Patient Instructions (Addendum)
 Please schedule a follow up visit in 12  months but call sooner if needed    For aneurysm follow up: Leary Roca, PA-C [facility] [phone]

## 2023-09-24 ENCOUNTER — Encounter (HOSPITAL_COMMUNITY): Payer: Self-pay

## 2023-09-24 ENCOUNTER — Ambulatory Visit (HOSPITAL_COMMUNITY)
Admission: RE | Admit: 2023-09-24 | Discharge: 2023-09-24 | Disposition: A | Discharge status: Home / Self Care | Source: Ambulatory Visit | Attending: Internal Medicine | Admitting: Internal Medicine

## 2023-09-24 ENCOUNTER — Encounter: Payer: Self-pay | Admitting: Internal Medicine

## 2023-09-24 ENCOUNTER — Inpatient Hospital Stay: Payer: Medicare Other | Attending: Internal Medicine

## 2023-09-24 DIAGNOSIS — C3490 Malignant neoplasm of unspecified part of unspecified bronchus or lung: Secondary | ICD-10-CM | POA: Diagnosis not present

## 2023-09-24 DIAGNOSIS — Z902 Acquired absence of lung [part of]: Secondary | ICD-10-CM | POA: Insufficient documentation

## 2023-09-24 DIAGNOSIS — C3412 Malignant neoplasm of upper lobe, left bronchus or lung: Secondary | ICD-10-CM | POA: Diagnosis not present

## 2023-09-24 DIAGNOSIS — J90 Pleural effusion, not elsewhere classified: Secondary | ICD-10-CM | POA: Diagnosis not present

## 2023-09-24 DIAGNOSIS — Z85118 Personal history of other malignant neoplasm of bronchus and lung: Secondary | ICD-10-CM | POA: Insufficient documentation

## 2023-09-24 DIAGNOSIS — J439 Emphysema, unspecified: Secondary | ICD-10-CM | POA: Diagnosis not present

## 2023-09-24 DIAGNOSIS — G6289 Other specified polyneuropathies: Secondary | ICD-10-CM | POA: Insufficient documentation

## 2023-09-24 LAB — CMP (CANCER CENTER ONLY)
ALT: 13 U/L (ref 0–44)
AST: 18 U/L (ref 15–41)
Albumin: 4.3 g/dL (ref 3.5–5.0)
Alkaline Phosphatase: 49 U/L (ref 38–126)
Anion gap: 3 — ABNORMAL LOW (ref 5–15)
BUN: 18 mg/dL (ref 8–23)
CO2: 30 mmol/L (ref 22–32)
Calcium: 9.6 mg/dL (ref 8.9–10.3)
Chloride: 106 mmol/L (ref 98–111)
Creatinine: 1.47 mg/dL — ABNORMAL HIGH (ref 0.61–1.24)
GFR, Estimated: 51 mL/min — ABNORMAL LOW (ref >60)
Glucose, Bld: 86 mg/dL (ref 70–99)
Potassium: 5 mmol/L (ref 3.5–5.1)
Sodium: 139 mmol/L (ref 135–145)
Total Bilirubin: 0.5 mg/dL (ref 0.0–1.2)
Total Protein: 6.9 g/dL (ref 6.5–8.1)

## 2023-09-24 LAB — CBC WITH DIFFERENTIAL (CANCER CENTER ONLY)
Abs Immature Granulocytes: 0.02 10*3/uL (ref 0.00–0.07)
Basophils Absolute: 0.1 10*3/uL (ref 0.0–0.1)
Basophils Relative: 1 %
Eosinophils Absolute: 0.1 10*3/uL (ref 0.0–0.5)
Eosinophils Relative: 2 %
HCT: 35.4 % — ABNORMAL LOW (ref 39.0–52.0)
Hemoglobin: 11.6 g/dL — ABNORMAL LOW (ref 13.0–17.0)
Immature Granulocytes: 0 %
Lymphocytes Relative: 16 %
Lymphs Abs: 1.2 10*3/uL (ref 0.7–4.0)
MCH: 29.9 pg (ref 26.0–34.0)
MCHC: 32.8 g/dL (ref 30.0–36.0)
MCV: 91.2 fL (ref 80.0–100.0)
Monocytes Absolute: 0.8 10*3/uL (ref 0.1–1.0)
Monocytes Relative: 11 %
Neutro Abs: 5 10*3/uL (ref 1.7–7.7)
Neutrophils Relative %: 70 %
Platelet Count: 230 10*3/uL (ref 150–400)
RBC: 3.88 MIL/uL — ABNORMAL LOW (ref 4.22–5.81)
RDW: 13.9 % (ref 11.5–15.5)
WBC Count: 7.1 10*3/uL (ref 4.0–10.5)
nRBC: 0 % (ref 0.0–0.2)

## 2023-09-24 MED ORDER — IOHEXOL 300 MG/ML  SOLN
75.0000 mL | Freq: Once | INTRAMUSCULAR | Status: AC | PRN
  Administered 2023-09-24: 75 mL via INTRAVENOUS

## 2023-09-24 MED ORDER — SODIUM CHLORIDE (PF) 0.9 % IJ SOLN
INTRAMUSCULAR | Status: AC
  Filled 2023-09-24: qty 50

## 2023-10-03 NOTE — Progress Notes (Signed)
 I called reading room and requested that the pt's Chest CT from 4/14 be read in time for the pt's f/u appt with Dr Marguerita Shih on 4/24.

## 2023-10-04 ENCOUNTER — Inpatient Hospital Stay: Payer: Medicare Other | Admitting: Internal Medicine

## 2023-10-04 VITALS — BP 123/60 | HR 71 | Temp 98.1°F | Resp 18 | Ht 68.0 in | Wt 178.9 lb

## 2023-10-04 DIAGNOSIS — C3490 Malignant neoplasm of unspecified part of unspecified bronchus or lung: Secondary | ICD-10-CM | POA: Diagnosis not present

## 2023-10-04 DIAGNOSIS — Z85118 Personal history of other malignant neoplasm of bronchus and lung: Secondary | ICD-10-CM | POA: Diagnosis not present

## 2023-10-04 DIAGNOSIS — G6289 Other specified polyneuropathies: Secondary | ICD-10-CM | POA: Diagnosis not present

## 2023-10-04 DIAGNOSIS — Z902 Acquired absence of lung [part of]: Secondary | ICD-10-CM | POA: Diagnosis not present

## 2023-10-04 NOTE — Progress Notes (Signed)
 [facility] Telephone:([phone]) [phone]   Fax:([phone]) [phone]  OFFICE PROGRESS NOTE  Kathyleen Parkins, MD [address] [place] [address]  DIAGNOSIS: Stage IIA (T2b, N0, M0) non-small cell lung cancer, adenocarcinoma presented with left upper lobe lung mass diagnosed in October 2024   Biomarker Findings Tumor Mutational Burden - 17 Muts/Mb HRD signature - HRDsig Positive Microsatellite status - MS-Stable Genomic Findings For a complete list of the genes assayed, please refer to the Appendix. KRAS G12R MYC amplification STK11 splice site 863-1G>T TSC2 W1194* CUL3 G578* NFKBIA amplification NKX2-1 amplification RBM10splice site 725-2A>C TP53 K164E 7 Disease relevant genes with no reportable alterations: ALK, BRAF, EGFR, ERBB2, MET, RET, ROS1  PDL1 TPS 0%  PRIOR THERAPY: status post left upper lobectomy with lymph node sampling under the care of Dr. Deloise Ferries on March 14, 2023.  CURRENT THERAPY: Observation.  INTERVAL HISTORY: Joshua [name] 73 y.o. male returns to the clinic today for follow-up visit accompanied by his sister.Discussed the use of AI scribe software for clinical note transcription with the patient, who gave verbal consent to proceed.  History of Present Illness   Joshua [name] is a 73 year old male with stage IIA non-small cell lung cancer who presents for evaluation with a repeat CT scan.  He was diagnosed with stage IIA non-small cell lung cancer, adenocarcinoma, in October 2024 and underwent a left upper lobectomy with lymph node sampling on March 14, 2023. The tumor was found to have no actionable mutations and negative PD-L1 expression.  Over the past six months, he has been feeling great and is able to perform daily activities without any significant issues. No chest pain or breathing difficulties. He uses a Trelegy inhaler, taking one puff as needed, which he states helps him feel 'good to go'.  He mentions a nerve injury  from the surgery, for which he is taking medication. Despite this, he remains active, enjoying outdoor activities such as mowing and ginseng hunting, which he finds fulfilling.       MEDICAL HISTORY: Past Medical History:  Diagnosis Date   Arthritis    Back pain    Cancer (HCC)    bladder in the past    Chronic pain    COPD (chronic obstructive pulmonary disease) (HCC)    HOH (hard of hearing)    VERY   Lung cancer (HCC) 2024   Meniere's disease    PONV (postoperative nausea and vomiting)    Pulmonary nodule, left    Stroke (HCC)    30 years ago    ALLERGIES:  has no known allergies.  MEDICATIONS:  Current Outpatient Medications  Medication Sig Dispense Refill   albuterol  (PROVENTIL  HFA;VENTOLIN  HFA) 108 (90 BASE) MCG/ACT inhaler Inhale 2 puffs into the lungs every 6 (six) hours as needed for wheezing or shortness of breath.  (Patient not taking: Reported on 09/11/2023)     atorvastatin  (LIPITOR) 20 MG tablet Take 20 mg by mouth daily.     cyclobenzaprine  (FLEXERIL ) 10 MG tablet Take 10 mg by mouth 3 (three) times daily as needed for muscle spasms.     diazepam  (VALIUM ) 10 MG tablet Take 10 mg by mouth every 8 (eight) hours as needed for anxiety or sleep.     Fluticasone -Umeclidin-Vilant (TRELEGY ELLIPTA ) 100-62.5-25 MCG/ACT AEPB One click each am 28 each 11   folic acid  (FOLVITE ) 1 MG tablet Take 1 tablet (1 mg total) by mouth daily. Start 7 days before pemetrexed chemotherapy. Continue until  21 days after pemetrexed completed. 100 tablet 3   gabapentin  (NEURONTIN ) 300 MG capsule Take 300 mg by mouth 3 (three) times daily.     nabumetone  (RELAFEN ) 500 MG tablet Take 500 mg by mouth in the morning, at noon, and at bedtime.     oxyCODONE -acetaminophen  (PERCOCET) 10-325 MG tablet Take 0.5-1 tablets by mouth 4 (four) times daily as needed.     oxymetazoline (AFRIN) 0.05 % nasal spray Place 1 spray into both nostrils 2 (two) times daily.     No current facility-administered  medications for this visit.    SURGICAL HISTORY:  Past Surgical History:  Procedure Laterality Date   BACK SURGERY  06/03/2018   spinal cord stimulator insertions   BLADDER SURGERY     HYDROCELE EXCISION Right 11/26/2018   Procedure: RIGHT HYDROCELECTOMY;  Surgeon: Trent Frizzle, MD;  Location: [facility];  Service: Urology;  Laterality: Right;   INTERCOSTAL NERVE BLOCK Left 03/14/2023   Procedure: INTERCOSTAL NERVE BLOCK;  Surgeon: Hilarie Lovely, MD;  Location: [facility];  Service: Thoracic;  Laterality: Left;   NASAL SINUS SURGERY     NODE DISSECTION Left 03/14/2023   Procedure: NODE DISSECTION;  Surgeon: Hilarie Lovely, MD;  Location: [facility];  Service: Thoracic;  Laterality: Left;    REVIEW OF SYSTEMS:  A comprehensive review of systems was negative.   PHYSICAL EXAMINATION: General appearance: alert, cooperative, and no distress Head: Normocephalic, without obvious abnormality, atraumatic Neck: no adenopathy, no JVD, supple, symmetrical, trachea midline, and thyroid  not enlarged, symmetric, no tenderness/mass/nodules Lymph nodes: Cervical, supraclavicular, and axillary nodes normal. Resp: clear to auscultation bilaterally Back: symmetric, no curvature. ROM normal. No CVA tenderness. Cardio: regular rate and rhythm, S1, S2 normal, no murmur, click, rub or gallop GI: soft, non-tender; bowel sounds normal; no masses,  no organomegaly Extremities: extremities normal, atraumatic, no cyanosis or edema  ECOG PERFORMANCE STATUS: 1 - Symptomatic but completely ambulatory  Blood pressure 123/60, pulse 71, temperature 98.1 F (36.7 C), temperature source Temporal, resp. rate 18, height 5\' 8"  (1.727 m), weight 178 lb 14.4 oz (81.1 kg), SpO2 100%.  LABORATORY DATA: Lab Results  Component Value Date   WBC 7.1 09/24/2023   HGB 11.6 (L) 09/24/2023   HCT 35.4 (L) 09/24/2023   MCV 91.2 09/24/2023   PLT 230 09/24/2023      Chemistry      Component Value Date/Time   NA 139  09/24/2023 1157   NA 138 03/09/2022 1547   K 5.0 09/24/2023 1157   CL 106 09/24/2023 1157   CO2 30 09/24/2023 1157   BUN 18 09/24/2023 1157   BUN 13 03/09/2022 1547   CREATININE 1.47 (H) 09/24/2023 1157      Component Value Date/Time   CALCIUM  9.6 09/24/2023 1157   ALKPHOS 49 09/24/2023 1157   AST 18 09/24/2023 1157   ALT 13 09/24/2023 1157   BILITOT 0.5 09/24/2023 1157       RADIOGRAPHIC STUDIES: CT Chest W Contrast Result Date: 10/04/2023 CLINICAL DATA:  Left upper lobe adenocarcinoma status post left upper lobectomy 03/14/2019. * Tracking Code: BO * EXAM: CT CHEST WITH CONTRAST TECHNIQUE: Multidetector CT imaging of the chest was performed during intravenous contrast administration. RADIATION DOSE REDUCTION: This exam was performed according to the departmental dose-optimization program which includes automated exposure control, adjustment of the mA and/or kV according to patient size and/or use of iterative reconstruction technique. CONTRAST:  75mL OMNIPAQUE  IOHEXOL  300 MG/ML  SOLN COMPARISON:  Nuclear medicine PET dated 01/25/2023  FINDINGS: Cardiovascular: Normal heart size. No significant pericardial fluid/thickening. Great vessels are normal in course and caliber. No central pulmonary emboli. Postsurgical change, likely related to prior patent foramen ovale. Coronary artery calcifications. Aortic atherosclerosis. Mediastinum/Nodes: Imaged thyroid  gland without nodules meeting criteria for imaging follow-up by size. Normal esophagus. No pathologically enlarged axillary, supraclavicular, mediastinal, or hilar lymph nodes. Lungs/Pleura: The central airways are patent. Small volume adherent secretions within the trachea. Postsurgical changes of left upper lobectomy. Similar severe emphysematous changes of the right upper lung. Unchanged band-like scarring along the posterior right upper lobe. Subpleural 3 mm right middle lobe nodule (7:117), not metabolically active. Additional scattered  pulmonary nodules measuring up to 4 mm (7:60), two of which appear new (7:49, 77). No pneumothorax. Small left pleural effusion. Upper abdomen: Hepatic segment 7 subcentimeter hypodensity (2:122), too small to characterize but unchanged and not metabolically active, likely cyst. Musculoskeletal: Old fracture of left posterolateral eighth rib. Small focus of intercostal soft tissue thickening between the left posterior ninth and tenth ribs (2:125), possibly postsurgical. Intrathecal lead tip terminates at the level of T7. IMPRESSION: 1. Postsurgical changes of left upper lobectomy. 2. Scattered pulmonary nodules measuring up to 4 mm. 3. Small left pleural effusion. 4. Aortic Atherosclerosis (ICD10-I70.0) and Emphysema (ICD10-J43.9). Coronary artery calcifications. Assessment for potential risk factor modification, dietary therapy or pharmacologic therapy may be warranted, if clinically indicated. Electronically Signed   By: Limin  Xu M.D.   On: 10/04/2023 08:05    ASSESSMENT AND PLAN: This is a very pleasant 73 years old white male diagnosed with Stage IIA (T2b, N0, M0) non-small cell lung cancer, adenocarcinoma presented with left upper lobe lung mass diagnosed in October 2024.  He is status post left upper lobectomy with lymph node sampling under the care of Dr. Deloise Ferries on March 14, 2023.  The patient has no actionable mutations and negative PD-L1 expression. He is currently on observation and feeling fine. He had repeat CT scan of the chest performed recently.  I personally and independently reviewed the scan and discussed the result with the patient today.    Non-small cell lung cancer, stage 2A Stage 2A non-small cell lung cancer, adenocarcinoma subtype, status post left upper lobectomy with lymph node sampling in October 2024. No actionable mutations and negative PD-L1 expression. Recent CT scan shows well-managed findings with tiny nodules in the lungs, differential includes scarring versus other  etiologies. He is asymptomatic and reports feeling well. - Repeat CT scan in 6 months to monitor lung nodules.  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 He reports ongoing nerve pain and is currently on medication for management.   The patient was advised to call immediately if he has any other symptoms in the interval. The patient voices understanding of current disease status and treatment options and is in agreement with the current care plan.  All questions were answered. The patient knows to call the clinic with any problems, questions or concerns. We can certainly see the patient much sooner if necessary.  The total time spent in the appointment was 20 minutes.  Disclaimer: This note was dictated with voice recognition software. Similar sounding words can inadvertently be transcribed and may not be corrected upon review.

## 2023-10-05 ENCOUNTER — Other Ambulatory Visit: Payer: Self-pay

## 2023-10-08 DIAGNOSIS — M5414 Radiculopathy, thoracic region: Secondary | ICD-10-CM | POA: Diagnosis not present

## 2023-10-08 DIAGNOSIS — J449 Chronic obstructive pulmonary disease, unspecified: Secondary | ICD-10-CM | POA: Diagnosis not present

## 2023-10-08 DIAGNOSIS — G894 Chronic pain syndrome: Secondary | ICD-10-CM | POA: Diagnosis not present

## 2023-10-26 DIAGNOSIS — J449 Chronic obstructive pulmonary disease, unspecified: Secondary | ICD-10-CM | POA: Diagnosis not present

## 2023-10-26 DIAGNOSIS — J209 Acute bronchitis, unspecified: Secondary | ICD-10-CM | POA: Diagnosis not present

## 2023-10-26 DIAGNOSIS — M1991 Primary osteoarthritis, unspecified site: Secondary | ICD-10-CM | POA: Diagnosis not present

## 2023-10-26 DIAGNOSIS — J441 Chronic obstructive pulmonary disease with (acute) exacerbation: Secondary | ICD-10-CM | POA: Diagnosis not present

## 2023-10-26 DIAGNOSIS — G894 Chronic pain syndrome: Secondary | ICD-10-CM | POA: Diagnosis not present

## 2023-11-23 ENCOUNTER — Other Ambulatory Visit (HOSPITAL_COMMUNITY): Payer: Self-pay | Admitting: Internal Medicine

## 2023-11-23 DIAGNOSIS — J441 Chronic obstructive pulmonary disease with (acute) exacerbation: Secondary | ICD-10-CM | POA: Diagnosis not present

## 2023-11-23 DIAGNOSIS — Z6824 Body mass index (BMI) 24.0-24.9, adult: Secondary | ICD-10-CM | POA: Diagnosis not present

## 2023-11-23 DIAGNOSIS — M47816 Spondylosis without myelopathy or radiculopathy, lumbar region: Secondary | ICD-10-CM | POA: Diagnosis not present

## 2023-11-23 DIAGNOSIS — J449 Chronic obstructive pulmonary disease, unspecified: Secondary | ICD-10-CM | POA: Diagnosis not present

## 2023-11-23 DIAGNOSIS — M549 Dorsalgia, unspecified: Secondary | ICD-10-CM

## 2023-11-23 DIAGNOSIS — G894 Chronic pain syndrome: Secondary | ICD-10-CM | POA: Diagnosis not present

## 2023-11-23 DIAGNOSIS — M1991 Primary osteoarthritis, unspecified site: Secondary | ICD-10-CM | POA: Diagnosis not present

## 2023-12-05 ENCOUNTER — Telehealth: Payer: Self-pay

## 2023-12-05 NOTE — Telephone Encounter (Signed)
 Copied from CRM [id number]. Topic: General - Other >> Dec 04, 2023  4:26 PM Shona S wrote: Reason for CRM: patient mother, dpr, is calling to ask for status of mri and see if they can schedule at [facility], please call at [phone]. >> Dec 05, 2023  8:55 AM Zebedee B wrote: I do not see an order for a MRI ordered by Pulmonary will send to triage to check  Spoke with patient's mom Virginia  regarding prior message. They need to call Dr.Fusco's office

## 2023-12-07 DIAGNOSIS — M961 Postlaminectomy syndrome, not elsewhere classified: Secondary | ICD-10-CM | POA: Diagnosis not present

## 2023-12-07 DIAGNOSIS — Z79899 Other long term (current) drug therapy: Secondary | ICD-10-CM | POA: Diagnosis not present

## 2023-12-21 DIAGNOSIS — M47816 Spondylosis without myelopathy or radiculopathy, lumbar region: Secondary | ICD-10-CM | POA: Diagnosis not present

## 2023-12-21 DIAGNOSIS — J449 Chronic obstructive pulmonary disease, unspecified: Secondary | ICD-10-CM | POA: Diagnosis not present

## 2023-12-21 DIAGNOSIS — J441 Chronic obstructive pulmonary disease with (acute) exacerbation: Secondary | ICD-10-CM | POA: Diagnosis not present

## 2023-12-21 DIAGNOSIS — M5414 Radiculopathy, thoracic region: Secondary | ICD-10-CM | POA: Diagnosis not present

## 2024-01-10 DIAGNOSIS — J441 Chronic obstructive pulmonary disease with (acute) exacerbation: Secondary | ICD-10-CM | POA: Diagnosis not present

## 2024-01-11 DIAGNOSIS — G8929 Other chronic pain: Secondary | ICD-10-CM | POA: Diagnosis not present

## 2024-01-11 DIAGNOSIS — J449 Chronic obstructive pulmonary disease, unspecified: Secondary | ICD-10-CM | POA: Diagnosis not present

## 2024-01-11 DIAGNOSIS — M545 Low back pain, unspecified: Secondary | ICD-10-CM | POA: Diagnosis not present

## 2024-01-11 DIAGNOSIS — H9190 Unspecified hearing loss, unspecified ear: Secondary | ICD-10-CM | POA: Diagnosis not present

## 2024-01-11 DIAGNOSIS — Z79899 Other long term (current) drug therapy: Secondary | ICD-10-CM | POA: Diagnosis not present

## 2024-01-15 DIAGNOSIS — Z79899 Other long term (current) drug therapy: Secondary | ICD-10-CM | POA: Diagnosis not present

## 2024-01-18 DIAGNOSIS — M47816 Spondylosis without myelopathy or radiculopathy, lumbar region: Secondary | ICD-10-CM | POA: Diagnosis not present

## 2024-01-18 DIAGNOSIS — J449 Chronic obstructive pulmonary disease, unspecified: Secondary | ICD-10-CM | POA: Diagnosis not present

## 2024-01-18 DIAGNOSIS — G894 Chronic pain syndrome: Secondary | ICD-10-CM | POA: Diagnosis not present

## 2024-02-06 DIAGNOSIS — Z0000 Encounter for general adult medical examination without abnormal findings: Secondary | ICD-10-CM | POA: Diagnosis not present

## 2024-02-06 DIAGNOSIS — G8929 Other chronic pain: Secondary | ICD-10-CM | POA: Diagnosis not present

## 2024-02-06 DIAGNOSIS — C3492 Malignant neoplasm of unspecified part of left bronchus or lung: Secondary | ICD-10-CM | POA: Diagnosis not present

## 2024-02-06 DIAGNOSIS — M961 Postlaminectomy syndrome, not elsewhere classified: Secondary | ICD-10-CM | POA: Diagnosis not present

## 2024-02-06 DIAGNOSIS — M549 Dorsalgia, unspecified: Secondary | ICD-10-CM | POA: Diagnosis not present

## 2024-02-06 DIAGNOSIS — Z79899 Other long term (current) drug therapy: Secondary | ICD-10-CM | POA: Diagnosis not present

## 2024-02-06 DIAGNOSIS — G893 Neoplasm related pain (acute) (chronic): Secondary | ICD-10-CM | POA: Diagnosis not present

## 2024-02-08 DIAGNOSIS — Z79899 Other long term (current) drug therapy: Secondary | ICD-10-CM | POA: Diagnosis not present

## 2024-02-13 ENCOUNTER — Other Ambulatory Visit: Payer: Self-pay

## 2024-02-21 DIAGNOSIS — H9190 Unspecified hearing loss, unspecified ear: Secondary | ICD-10-CM | POA: Diagnosis not present

## 2024-02-21 DIAGNOSIS — Z79899 Other long term (current) drug therapy: Secondary | ICD-10-CM | POA: Diagnosis not present

## 2024-02-21 DIAGNOSIS — M545 Low back pain, unspecified: Secondary | ICD-10-CM | POA: Diagnosis not present

## 2024-02-21 DIAGNOSIS — J449 Chronic obstructive pulmonary disease, unspecified: Secondary | ICD-10-CM | POA: Diagnosis not present

## 2024-02-26 DIAGNOSIS — G4730 Sleep apnea, unspecified: Secondary | ICD-10-CM | POA: Diagnosis not present

## 2024-02-26 DIAGNOSIS — E559 Vitamin D deficiency, unspecified: Secondary | ICD-10-CM | POA: Diagnosis not present

## 2024-02-26 DIAGNOSIS — J449 Chronic obstructive pulmonary disease, unspecified: Secondary | ICD-10-CM | POA: Diagnosis not present

## 2024-02-26 DIAGNOSIS — Z131 Encounter for screening for diabetes mellitus: Secondary | ICD-10-CM | POA: Diagnosis not present

## 2024-02-26 DIAGNOSIS — Z13220 Encounter for screening for lipoid disorders: Secondary | ICD-10-CM | POA: Diagnosis not present

## 2024-02-26 DIAGNOSIS — M961 Postlaminectomy syndrome, not elsewhere classified: Secondary | ICD-10-CM | POA: Diagnosis not present

## 2024-02-26 DIAGNOSIS — Z711 Person with feared health complaint in whom no diagnosis is made: Secondary | ICD-10-CM | POA: Diagnosis not present

## 2024-02-26 DIAGNOSIS — Z1159 Encounter for screening for other viral diseases: Secondary | ICD-10-CM | POA: Diagnosis not present

## 2024-02-26 DIAGNOSIS — R5383 Other fatigue: Secondary | ICD-10-CM | POA: Diagnosis not present

## 2024-02-26 DIAGNOSIS — Z79899 Other long term (current) drug therapy: Secondary | ICD-10-CM | POA: Diagnosis not present

## 2024-02-26 DIAGNOSIS — Z136 Encounter for screening for cardiovascular disorders: Secondary | ICD-10-CM | POA: Diagnosis not present

## 2024-03-06 DIAGNOSIS — M961 Postlaminectomy syndrome, not elsewhere classified: Secondary | ICD-10-CM | POA: Diagnosis not present

## 2024-03-06 DIAGNOSIS — E7800 Pure hypercholesterolemia, unspecified: Secondary | ICD-10-CM | POA: Diagnosis not present

## 2024-03-06 DIAGNOSIS — R7989 Other specified abnormal findings of blood chemistry: Secondary | ICD-10-CM | POA: Diagnosis not present

## 2024-03-06 DIAGNOSIS — Z13820 Encounter for screening for osteoporosis: Secondary | ICD-10-CM | POA: Diagnosis not present

## 2024-03-06 DIAGNOSIS — J449 Chronic obstructive pulmonary disease, unspecified: Secondary | ICD-10-CM | POA: Diagnosis not present

## 2024-03-06 DIAGNOSIS — C3492 Malignant neoplasm of unspecified part of left bronchus or lung: Secondary | ICD-10-CM | POA: Diagnosis not present

## 2024-03-06 DIAGNOSIS — Z1211 Encounter for screening for malignant neoplasm of colon: Secondary | ICD-10-CM | POA: Diagnosis not present

## 2024-03-06 DIAGNOSIS — F17210 Nicotine dependence, cigarettes, uncomplicated: Secondary | ICD-10-CM | POA: Diagnosis not present

## 2024-03-06 DIAGNOSIS — R718 Other abnormality of red blood cells: Secondary | ICD-10-CM | POA: Diagnosis not present

## 2024-03-10 ENCOUNTER — Telehealth: Payer: Self-pay | Admitting: Internal Medicine

## 2024-03-10 ENCOUNTER — Encounter: Payer: Self-pay | Admitting: Internal Medicine

## 2024-03-10 NOTE — Telephone Encounter (Signed)
 Copied from CRM #[id number]. Topic: Appointments - Appointment Scheduling >> Mar 07, 2024  1:46 PM Rilla B wrote: Attempting to schedule patient for a follow up from from 09/11/2023. Patient is populating as a New Patient.  Unable to schedule.  Please call patient to schedule.  Spoke with patient's mother ([name]---on DPR)and she states Joshua [name] has a new primary care provider with [facility] and they are requiring the patient to have a PFT.  [facility] wants this done in [facility] and the patient is wondering if Dr. Darlean will order at PFT to be done at [facility] back [phone]

## 2024-03-11 ENCOUNTER — Other Ambulatory Visit: Payer: Self-pay

## 2024-03-11 ENCOUNTER — Encounter (INDEPENDENT_AMBULATORY_CARE_PROVIDER_SITE_OTHER): Payer: Self-pay | Admitting: *Deleted

## 2024-03-11 DIAGNOSIS — J449 Chronic obstructive pulmonary disease, unspecified: Secondary | ICD-10-CM

## 2024-03-11 NOTE — Telephone Encounter (Signed)
 Called and spoke with barrys daughter carol in dpr that I have order the pft as requested

## 2024-03-11 NOTE — Telephone Encounter (Signed)
 Joshua [name] medical and pt are requesting an order for pft for pt to completed at [place] - is this ok?

## 2024-03-14 ENCOUNTER — Telehealth: Payer: Self-pay | Admitting: Pulmonary Disease

## 2024-03-14 NOTE — Telephone Encounter (Signed)
 Called and spoke with Niels (sister on DPR) and she stated patient wants to wait and see if Dr. Sherrod orders the PFT---if not, they will call me back and schedule

## 2024-03-14 NOTE — Telephone Encounter (Signed)
 ERROR

## 2024-03-24 ENCOUNTER — Inpatient Hospital Stay: Attending: Internal Medicine

## 2024-03-24 ENCOUNTER — Ambulatory Visit (HOSPITAL_COMMUNITY)
Admission: RE | Admit: 2024-03-24 | Discharge: 2024-03-24 | Disposition: A | Discharge status: Home / Self Care | Source: Ambulatory Visit | Attending: Internal Medicine | Admitting: Internal Medicine

## 2024-03-24 DIAGNOSIS — C3490 Malignant neoplasm of unspecified part of unspecified bronchus or lung: Secondary | ICD-10-CM | POA: Insufficient documentation

## 2024-03-24 DIAGNOSIS — Z79899 Other long term (current) drug therapy: Secondary | ICD-10-CM | POA: Insufficient documentation

## 2024-03-24 DIAGNOSIS — Z85118 Personal history of other malignant neoplasm of bronchus and lung: Secondary | ICD-10-CM | POA: Diagnosis present

## 2024-03-24 DIAGNOSIS — Z87891 Personal history of nicotine dependence: Secondary | ICD-10-CM | POA: Insufficient documentation

## 2024-03-24 DIAGNOSIS — Z902 Acquired absence of lung [part of]: Secondary | ICD-10-CM | POA: Insufficient documentation

## 2024-03-24 DIAGNOSIS — K5903 Drug induced constipation: Secondary | ICD-10-CM | POA: Insufficient documentation

## 2024-03-24 LAB — CBC WITH DIFFERENTIAL (CANCER CENTER ONLY)
Abs Immature Granulocytes: 0.02 K/uL (ref 0.00–0.07)
Basophils Absolute: 0.1 K/uL (ref 0.0–0.1)
Basophils Relative: 1 %
Eosinophils Absolute: 0.1 K/uL (ref 0.0–0.5)
Eosinophils Relative: 2 %
HCT: 35.1 % — ABNORMAL LOW (ref 39.0–52.0)
Hemoglobin: 11.6 g/dL — ABNORMAL LOW (ref 13.0–17.0)
Immature Granulocytes: 0 %
Lymphocytes Relative: 19 %
Lymphs Abs: 1.3 K/uL (ref 0.7–4.0)
MCH: 29.9 pg (ref 26.0–34.0)
MCHC: 33 g/dL (ref 30.0–36.0)
MCV: 90.5 fL (ref 80.0–100.0)
Monocytes Absolute: 0.7 K/uL (ref 0.1–1.0)
Monocytes Relative: 10 %
Neutro Abs: 4.9 K/uL (ref 1.7–7.7)
Neutrophils Relative %: 68 %
Platelet Count: 247 K/uL (ref 150–400)
RBC: 3.88 MIL/uL — ABNORMAL LOW (ref 4.22–5.81)
RDW: 12.6 % (ref 11.5–15.5)
WBC Count: 7.2 K/uL (ref 4.0–10.5)
nRBC: 0 % (ref 0.0–0.2)

## 2024-03-24 LAB — CMP (CANCER CENTER ONLY)
ALT: 13 U/L (ref 0–44)
AST: 18 U/L (ref 15–41)
Albumin: 4.2 g/dL (ref 3.5–5.0)
Alkaline Phosphatase: 49 U/L (ref 38–126)
Anion gap: 4 — ABNORMAL LOW (ref 5–15)
BUN: 13 mg/dL (ref 8–23)
CO2: 29 mmol/L (ref 22–32)
Calcium: 9.8 mg/dL (ref 8.9–10.3)
Chloride: 103 mmol/L (ref 98–111)
Creatinine: 1.18 mg/dL (ref 0.61–1.24)
GFR, Estimated: >60 mL/min (ref >60)
Glucose, Bld: 101 mg/dL — ABNORMAL HIGH (ref 70–99)
Potassium: 4.3 mmol/L (ref 3.5–5.1)
Sodium: 136 mmol/L (ref 135–145)
Total Bilirubin: 0.4 mg/dL (ref 0.0–1.2)
Total Protein: 6.8 g/dL (ref 6.5–8.1)

## 2024-03-27 NOTE — Progress Notes (Signed)
 [facility] OFFICE PROGRESS NOTE  Joshua [name], [name], NP [address] [name] [address]  DIAGNOSIS: Stage IIA (T2b, N0, M0) non-small cell lung cancer, adenocarcinoma presented with left upper lobe lung mass diagnosed in October 2024    Biomarker Findings Tumor Mutational Burden - 17 Muts/Mb HRD signature - HRDsig Positive Microsatellite status - MS-Stable Genomic Findings For a complete list of the genes assayed, please refer to the Appendix. KRAS G12R MYC amplification STK11 splice site 863-1G>T TSC2 W1194* CUL3 G578* NFKBIA amplification NKX2-1 amplification RBM10splice site 725-2A>C TP53 K164E 7 Disease relevant genes with no reportable alterations: ALK, BRAF, EGFR, ERBB2, MET, RET, ROS1   PDL1 TPS 0%  PRIOR THERAPY: status post left upper lobectomy with lymph node sampling under the care of Dr. Shyrl on March 14, 2023.   CURRENT THERAPY: Observation   INTERVAL HISTORY: Joshua [name] 73 y.o. male returns to clinic today for follow-up visit.  The patient was last seen in the clinic in April 2025 by Dr. Sherrod.  The patient is status post surgical resection with left upper lobectomy in October 2024.  The patient denies any major changes in his health since last being seen.  Denies any fever, chills, night sweats, or unexplained weight loss.  He denies any chest pain, shortness of breath, cough, or hemoptysis. He denies any recent URI. He was working out in the yard yesterday. He quit smoking 18 months ago.  He denies any nausea, vomiting, or diarrhea. He sometimes has constipation due to pain medication use. He denies any headache or visual changes.  He recently had a restaging CT scan performed.  He is here today for evaluation and to review his scan results.  MEDICAL HISTORY: Past Medical History:  Diagnosis Date   Arthritis    Back pain    Cancer (HCC)    bladder in the past    Chronic pain    COPD (chronic obstructive pulmonary  disease) (HCC)    HOH (hard of hearing)    VERY   Lung cancer (HCC) 2024   Meniere's disease    PONV (postoperative nausea and vomiting)    Pulmonary nodule, left    Stroke (HCC)    30 years ago    ALLERGIES:  has no known allergies.  MEDICATIONS:  Current Outpatient Medications  Medication Sig Dispense Refill   atorvastatin  (LIPITOR) 20 MG tablet Take 20 mg by mouth daily.     cyclobenzaprine  (FLEXERIL ) 10 MG tablet Take 10 mg by mouth 3 (three) times daily as needed for muscle spasms.     diazepam  (VALIUM ) 10 MG tablet Take 10 mg by mouth every 8 (eight) hours as needed for anxiety or sleep.     Fluticasone -Umeclidin-Vilant (TRELEGY ELLIPTA ) 100-62.5-25 MCG/ACT AEPB One click each am 28 each 11   gabapentin  (NEURONTIN ) 300 MG capsule Take 300 mg by mouth 3 (three) times daily.     nabumetone  (RELAFEN ) 500 MG tablet Take 500 mg by mouth in the morning, at noon, and at bedtime.     oxyCODONE -acetaminophen  (PERCOCET) 10-325 MG tablet Take 0.5-1 tablets by mouth 4 (four) times daily as needed.     oxymetazoline (AFRIN) 0.05 % nasal spray Place 1 spray into both nostrils 2 (two) times daily.     albuterol  (PROVENTIL  HFA;VENTOLIN  HFA) 108 (90 BASE) MCG/ACT inhaler Inhale 2 puffs into the lungs every 6 (six) hours as needed for wheezing or shortness of breath.  (Patient not taking: Reported on 09/11/2023)  folic acid  (FOLVITE ) 1 MG tablet Take 1 tablet (1 mg total) by mouth daily. Start 7 days before pemetrexed chemotherapy. Continue until 21 days after pemetrexed completed. (Patient not taking: Reported on 03/31/2024) 100 tablet 3   No current facility-administered medications for this visit.    SURGICAL HISTORY:  Past Surgical History:  Procedure Laterality Date   BACK SURGERY  06/03/2018   spinal cord stimulator insertions   BLADDER SURGERY     HYDROCELE EXCISION Right 11/26/2018   Procedure: RIGHT HYDROCELECTOMY;  Surgeon: Matilda Senior, MD;  Location: [facility];  Service:  Urology;  Laterality: Right;   INTERCOSTAL NERVE BLOCK Left 03/14/2023   Procedure: INTERCOSTAL NERVE BLOCK;  Surgeon: Shyrl Linnie KIDD, MD;  Location: [facility] OR;  Service: Thoracic;  Laterality: Left;   NASAL SINUS SURGERY     NODE DISSECTION Left 03/14/2023   Procedure: NODE DISSECTION;  Surgeon: Shyrl Linnie KIDD, MD;  Location: [facility];  Service: Thoracic;  Laterality: Left;    REVIEW OF SYSTEMS:   Review of Systems  Constitutional: Negative for appetite change, chills, fatigue, fever and unexpected weight change.  HENT:   Negative for mouth sores, nosebleeds, sore throat and trouble swallowing.   Eyes: Negative for eye problems and icterus.  Respiratory: Negative for cough, hemoptysis, shortness of breath and wheezing.   Cardiovascular: Negative for chest pain and leg swelling.  Gastrointestinal: Positive for constipation. Negative for abdominal pain, diarrhea, nausea and vomiting.  Genitourinary: Negative for bladder incontinence, difficulty urinating, dysuria, frequency and hematuria.   Musculoskeletal: Negative for back pain, gait problem, neck pain and neck stiffness.  Skin: Negative for itching and rash.  Neurological: Negative for dizziness, extremity weakness, gait problem, headaches, light-headedness and seizures.  Hematological: Negative for adenopathy. Does not bruise/bleed easily.  Psychiatric/Behavioral: Negative for confusion, depression and sleep disturbance. The patient is not nervous/anxious.     PHYSICAL EXAMINATION:  Blood pressure 107/64, pulse 78, temperature 97.9 F (36.6 C), temperature source Temporal, resp. rate 16, weight 179 lb (81.2 kg), SpO2 98%.  ECOG PERFORMANCE STATUS: 1  Physical Exam  Constitutional: Oriented to person, place, and time and well-developed, well-nourished, and in no distress.  HENT:  Head: Hard of hearing. Normocephalic and atraumatic.  Mouth/Throat: Oropharynx is clear and moist. No oropharyngeal exudate.  Eyes: Conjunctivae are  normal. Right eye exhibits no discharge. Left eye exhibits no discharge. No scleral icterus.  Neck: Normal range of motion. Neck supple.  Cardiovascular: Normal rate, regular rhythm, normal heart sounds and intact distal pulses.   Pulmonary/Chest: Effort normal and breath sounds normal. No respiratory distress. No wheezes. No rales.  Abdominal: Soft. Bowel sounds are normal. Exhibits no distension and no mass. There is no tenderness.  Musculoskeletal: Normal range of motion. Exhibits no edema.  Lymphadenopathy:    No cervical adenopathy.  Neurological: Alert and oriented to person, place, and time. Exhibits normal muscle tone. Gait normal. Coordination normal.  Skin: Skin is warm and dry. No rash noted. Not diaphoretic. No erythema. No pallor.  Psychiatric: Mood, memory and judgment normal.  Vitals reviewed.  LABORATORY DATA: Lab Results  Component Value Date   WBC 7.2 03/24/2024   HGB 11.6 (L) 03/24/2024   HCT 35.1 (L) 03/24/2024   MCV 90.5 03/24/2024   PLT 247 03/24/2024      Chemistry      Component Value Date/Time   NA 136 03/24/2024 1337   NA 138 03/09/2022 1547   K 4.3 03/24/2024 1337   CL 103 03/24/2024 1337  CO2 29 03/24/2024 1337   BUN 13 03/24/2024 1337   BUN 13 03/09/2022 1547   CREATININE 1.18 03/24/2024 1337      Component Value Date/Time   CALCIUM  9.8 03/24/2024 1337   ALKPHOS 49 03/24/2024 1337   AST 18 03/24/2024 1337   ALT 13 03/24/2024 1337   BILITOT 0.4 03/24/2024 1337       RADIOGRAPHIC STUDIES:  CT Chest Wo Contrast Result Date: 03/25/2024 CLINICAL DATA:  Non-small-cell lung cancer restaging, status post left upper lobectomy * Tracking Code: BO * EXAM: CT CHEST WITHOUT CONTRAST TECHNIQUE: Multidetector CT imaging of the chest was performed following the standard protocol without IV contrast. RADIATION DOSE REDUCTION: This exam was performed according to the departmental dose-optimization program which includes automated exposure control,  adjustment of the mA and/or kV according to patient size and/or use of iterative reconstruction technique. COMPARISON:  09/24/2023 FINDINGS: Cardiovascular: Aortic atherosclerosis. Normal heart size. Left coronary artery calcifications. No pericardial effusion. Mediastinum/Nodes: No enlarged mediastinal, hilar, or axillary lymph nodes. Thyroid  gland, trachea, and esophagus demonstrate no significant findings. Lungs/Pleura: Unchanged postoperative appearance of the chest status post left upper lobectomy. Severe, bullous emphysema and diffuse bilateral bronchial wall. Unchanged bandlike scarring in the posterior right upper lobe (series 5, image 43). New nodule in the anterior right upper lobe measuring 0.7 cm (series 5, image 68). Unchanged tiny nodule in the superior segment right lower lobe measuring 0.3 cm (series 5, image 75). No pleural effusion or pneumothorax. Upper Abdomen: No acute abnormality.  Severely atrophic left kidney. Musculoskeletal: No chest wall abnormality. No acute osseous findings. IMPRESSION: 1. Unchanged postoperative appearance of the chest status post left upper lobectomy. 2. New nodule in the anterior right upper lobe measuring 0.7 cm. This is nonspecific and may be infectious or inflammatory however warrants careful attention on follow-up. 3. Unchanged tiny nodule in the superior segment right lower lobe measuring 0.3 cm. 4. Severe, bullous emphysema and diffuse bilateral bronchial wall thickening. 5. Coronary artery disease. Aortic Atherosclerosis (ICD10-I70.0) and Emphysema (ICD10-J43.9). Electronically Signed   By: Marolyn JONETTA Jaksch M.D.   On: 03/25/2024 18:20     ASSESSMENT/PLAN:  This is a very pleasant 73 year old Caucasian male diagnosed with stage IIa (T2b, N0, M0) non-small cell lung cancer, adenocarcinoma.  The patient presented with a left upper lobe lung mass which was diagnosed in October 2024.  He status post left upper lobectomy with lymph node sampling under the care of  Dr. Shyrl on 03/14/2023.  He has no actionable mutations and his PD-L1 expression is negative.  The patient was seen with Dr. Sherrod today.  Dr. Sherrod personally and independently reviewed the scan and discussed results with the patient today.  The scan showed no evidence of disease progression except there is a new RIGHT lung nodule which could be infectious of inflammatory but warrants monitoring.  Dr. Sherrod recommends we monitor closely with a CT scan in 6 months.   Arrange for restaging CT scan of the chest in 6 months.  We will see him back 1 week later to review the results in the office.  The patient was advised to call immediately if he has any concerning symptoms in the interval. The patient voices understanding of current disease status and treatment options and is in agreement with the current care plan. All questions were answered. The patient knows to call the clinic with any problems, questions or concerns. We can certainly see the patient much sooner if necessary   Orders Placed This Encounter  Procedures   CT Chest W Contrast    Standing Status:   Future    Expected Date:   09/29/2024    Expiration Date:   03/31/2025    If indicated for the ordered procedure, I authorize the administration of contrast media per Radiology protocol:   Yes    Does the patient have a contrast media/X-ray dye allergy?:   No    Preferred imaging location?:   [facility]   CBC with Differential (Cancer Center Only)    Standing Status:   Future    Expected Date:   09/29/2024    Expiration Date:   03/31/2025   CMP (Cancer Center only)    Standing Status:   Future    Expected Date:   09/29/2024    Expiration Date:   03/31/2025     Calton CROME Zilda No, PA-C 03/31/24  ADDENDUM: Hematology/Oncology Attending: I had a face-to-face encounter with the patient today.  I reviewed his record, lab, scan and recommended his care plan.  This is a very pleasant 73 years old white  male with a stage IIa non-small cell lung cancer, adenocarcinoma diagnosed in October 2024 with no actionable mutation and negative PD-L1 expression.  The patient is currently on observation after right upper lobectomy with lymph node sampling in October 2024.  He had repeat CT scan of the chest performed recently.  I personally independently reviewed the scan and discussed the result with the patient and his wife today.  His scan showed no concerning findings for disease recurrence or metastasis but there was a 0.7 cm right lung nodule that is new from before and suspicious to be inflammatory or infectious in origin. I recommended for the patient to continue on observation with repeat CT scan of the chest in 6 months for restaging of his disease. The patient was advised to call immediately if he has any concerning symptoms in the interval. The total time spent in the appointment was 20 minutes including review of chart and various tests results, discussions about plan of care and coordination of care plan . Disclaimer: This note was dictated with voice recognition software. Similar sounding words can inadvertently be transcribed and may be missed upon review. Sherrod MARLA Sherrod, MD

## 2024-03-31 ENCOUNTER — Inpatient Hospital Stay: Admitting: Physician Assistant

## 2024-03-31 ENCOUNTER — Ambulatory Visit: Admitting: Internal Medicine

## 2024-03-31 ENCOUNTER — Telehealth: Payer: Self-pay

## 2024-03-31 VITALS — BP 107/64 | HR 78 | Temp 97.9°F | Resp 16 | Wt 179.0 lb

## 2024-03-31 DIAGNOSIS — C3490 Malignant neoplasm of unspecified part of unspecified bronchus or lung: Secondary | ICD-10-CM

## 2024-03-31 DIAGNOSIS — C3412 Malignant neoplasm of upper lobe, left bronchus or lung: Secondary | ICD-10-CM

## 2024-03-31 DIAGNOSIS — Z85118 Personal history of other malignant neoplasm of bronchus and lung: Secondary | ICD-10-CM | POA: Diagnosis not present

## 2024-03-31 NOTE — Telephone Encounter (Signed)
 Spoke with the patient's mother regarding today's appointment. She stated that the appointment time had changed and she was not aware. Informed her that Cassie, [place], will see the patient upon arrival. She voiced thanks, and I apologized for the appointment mishap.

## 2024-04-03 ENCOUNTER — Telehealth: Payer: Self-pay | Admitting: Internal Medicine

## 2024-04-03 NOTE — Telephone Encounter (Signed)
 Called the patient and scheduled appointments with his mother due to hearing issues. They are both aware of the appointment details.

## 2024-04-04 ENCOUNTER — Other Ambulatory Visit: Payer: Self-pay

## 2024-06-27 ENCOUNTER — Encounter (HOSPITAL_COMMUNITY): Payer: Self-pay

## 2024-06-27 ENCOUNTER — Emergency Department (HOSPITAL_COMMUNITY)
Admission: EM | Admit: 2024-06-27 | Discharge: 2024-06-27 | Disposition: A | Discharge status: Home / Self Care | Attending: Emergency Medicine | Admitting: Emergency Medicine

## 2024-06-27 ENCOUNTER — Emergency Department (HOSPITAL_COMMUNITY)

## 2024-06-27 ENCOUNTER — Other Ambulatory Visit: Payer: Self-pay

## 2024-06-27 DIAGNOSIS — R918 Other nonspecific abnormal finding of lung field: Secondary | ICD-10-CM | POA: Insufficient documentation

## 2024-06-27 DIAGNOSIS — R5383 Other fatigue: Secondary | ICD-10-CM | POA: Diagnosis not present

## 2024-06-27 DIAGNOSIS — R531 Weakness: Secondary | ICD-10-CM | POA: Diagnosis not present

## 2024-06-27 LAB — CBC WITH DIFFERENTIAL/PLATELET
Abs Immature Granulocytes: 0.03 K/uL (ref 0.00–0.07)
Basophils Absolute: 0.1 K/uL (ref 0.0–0.1)
Basophils Relative: 1 %
Eosinophils Absolute: 0.1 K/uL (ref 0.0–0.5)
Eosinophils Relative: 2 %
HCT: 35.3 % — ABNORMAL LOW (ref 39.0–52.0)
Hemoglobin: 11.6 g/dL — ABNORMAL LOW (ref 13.0–17.0)
Immature Granulocytes: 0 %
Lymphocytes Relative: 22 %
Lymphs Abs: 1.6 K/uL (ref 0.7–4.0)
MCH: 30.1 pg (ref 26.0–34.0)
MCHC: 32.9 g/dL (ref 30.0–36.0)
MCV: 91.7 fL (ref 80.0–100.0)
Monocytes Absolute: 1 K/uL (ref 0.1–1.0)
Monocytes Relative: 13 %
Neutro Abs: 4.8 K/uL (ref 1.7–7.7)
Neutrophils Relative %: 62 %
Platelets: 245 K/uL (ref 150–400)
RBC: 3.85 MIL/uL — ABNORMAL LOW (ref 4.22–5.81)
RDW: 12.8 % (ref 11.5–15.5)
WBC: 7.6 K/uL (ref 4.0–10.5)
nRBC: 0 % (ref 0.0–0.2)

## 2024-06-27 LAB — COMPREHENSIVE METABOLIC PANEL WITH GFR
ALT: 13 U/L (ref 0–44)
AST: 23 U/L (ref 15–41)
Albumin: 4.2 g/dL (ref 3.5–5.0)
Alkaline Phosphatase: 54 U/L (ref 38–126)
Anion gap: 10 (ref 5–15)
BUN: 17 mg/dL (ref 8–23)
CO2: 26 mmol/L (ref 22–32)
Calcium: 9.4 mg/dL (ref 8.9–10.3)
Chloride: 104 mmol/L (ref 98–111)
Creatinine, Ser: 1.04 mg/dL (ref 0.61–1.24)
GFR, Estimated: >60 mL/min
Glucose, Bld: 95 mg/dL (ref 70–99)
Potassium: 4 mmol/L (ref 3.5–5.1)
Sodium: 140 mmol/L (ref 135–145)
Total Bilirubin: 0.5 mg/dL (ref 0.0–1.2)
Total Protein: 6.6 g/dL (ref 6.5–8.1)

## 2024-06-27 LAB — CK: Total CK: 65 U/L (ref 49–397)

## 2024-06-27 NOTE — Discharge Instructions (Signed)
 Take 1 multivitamin a day and follow-up with your family doctor next week

## 2024-06-27 NOTE — ED Triage Notes (Signed)
 Patient arrives POV from home. States about a month ago he began to feel very fatigued. Does not complain of any SOB or breathing difficulty.

## 2024-06-27 NOTE — ED Notes (Signed)
 Pt/family received d/c paperwork at this time. After going over the paperwork any questions, comments, or concerns were answered to the best of this nurse's knowledge. The pt/family verbally acknowledged the teachings/instructions.   D/c info reviewed with pt and daughter, Ellouise.

## 2024-06-29 NOTE — ED Provider Notes (Signed)
 " [place] [facility] AT [facility] Provider Note   CSN: [id number] Arrival date & time: 06/27/24  1811     Patient presents with: Fatigue   Joshua [name] is a 74 y.o. male.   Patient complains of fatigue for a number of weeks.  No fever no chills no cough no nausea no vomiting  The history is provided by the patient and medical records. No language interpreter was used.  Weakness Severity:  Mild Onset quality:  Gradual Timing:  Intermittent Progression:  Waxing and waning Chronicity:  Recurrent Context: not alcohol use   Relieved by:  Nothing Worsened by:  Nothing Ineffective treatments:  None tried Associated symptoms: no abdominal pain, no chest pain, no cough, no diarrhea, no frequency, no headaches and no seizures        Prior to Admission medications  Medication Sig Start Date End Date Taking? Authorizing Provider  albuterol  (PROVENTIL  HFA;VENTOLIN  HFA) 108 (90 BASE) MCG/ACT inhaler Inhale 2 puffs into the lungs every 6 (six) hours as needed for wheezing or shortness of breath.  Patient not taking: Reported on 09/11/2023    [provider]  atorvastatin  (LIPITOR) 20 MG tablet Take 20 mg by mouth daily. 01/11/23   [provider]  cyclobenzaprine  (FLEXERIL ) 10 MG tablet Take 10 mg by mouth 3 (three) times daily as needed for muscle spasms. 11/29/22   [provider]  diazepam  (VALIUM ) 10 MG tablet Take 10 mg by mouth every 8 (eight) hours as needed for anxiety or sleep.    [provider]  Fluticasone -Umeclidin-Vilant (TRELEGY ELLIPTA ) 100-62.5-25 MCG/ACT AEPB One click each am 05/11/22   Darlean Ozell NOVAK, MD  folic acid  (FOLVITE ) 1 MG tablet Take 1 tablet (1 mg total) by mouth daily. Start 7 days before pemetrexed chemotherapy. Continue until 21 days after pemetrexed completed. Patient not taking: Reported on 03/31/2024 04/22/23   Sherrod Sherrod, MD  gabapentin  (NEURONTIN ) 300 MG capsule Take 300 mg by mouth 3  (three) times daily. 01/11/23   [provider]  nabumetone  (RELAFEN ) 500 MG tablet Take 500 mg by mouth in the morning, at noon, and at bedtime. 01/11/23   [provider]  oxyCODONE -acetaminophen  (PERCOCET) 10-325 MG tablet Take 0.5-1 tablets by mouth 4 (four) times daily as needed. 08/14/23   [provider]  oxymetazoline (AFRIN) 0.05 % nasal spray Place 1 spray into both nostrils 2 (two) times daily.    [provider]    Allergies: Patient has no known allergies.    Review of Systems  Constitutional:  Negative for appetite change and fatigue.  HENT:  Negative for congestion, ear discharge and sinus pressure.   Eyes:  Negative for discharge.  Respiratory:  Negative for cough.   Cardiovascular:  Negative for chest pain.  Gastrointestinal:  Negative for abdominal pain and diarrhea.  Genitourinary:  Negative for frequency and hematuria.  Musculoskeletal:  Negative for back pain.  Skin:  Negative for rash.  Neurological:  Positive for weakness. Negative for seizures and headaches.  Psychiatric/Behavioral:  Negative for hallucinations.     Updated Vital Signs BP (!) 148/87 (BP Location: Right Arm)   Pulse 97   Temp 98.6 F (37 C)   Resp 16   Ht 5' 11 (1.803 m)   Wt 79.4 kg   SpO2 97%   BMI 24.41 kg/m   Physical Exam Vitals and nursing note reviewed.  Constitutional:      Appearance: He is well-developed.  HENT:  Head: Normocephalic.     Nose: Nose normal.  Eyes:     General: No scleral icterus.    Conjunctiva/sclera: Conjunctivae normal.  Neck:     Thyroid : No thyromegaly.  Cardiovascular:     Rate and Rhythm: Normal rate and regular rhythm.     Heart sounds: No murmur heard.    No friction rub. No gallop.  Pulmonary:     Breath sounds: No stridor. No wheezing or rales.  Chest:     Chest wall: No tenderness.  Abdominal:     General: There is no distension.     Tenderness: There is no abdominal tenderness. There is no rebound.   Musculoskeletal:        General: Normal range of motion.     Cervical back: Neck supple.  Lymphadenopathy:     Cervical: No cervical adenopathy.  Skin:    Findings: No erythema or rash.  Neurological:     Mental Status: He is alert and oriented to person, place, and time.     Motor: No abnormal muscle tone.     Coordination: Coordination normal.  Psychiatric:        Behavior: Behavior normal.     (all labs ordered are listed, but only abnormal results are displayed) Labs Reviewed  CBC WITH DIFFERENTIAL/PLATELET - Abnormal; Notable for the following components:      Result Value   RBC 3.85 (*)    Hemoglobin 11.6 (*)    HCT 35.3 (*)    All other components within normal limits  COMPREHENSIVE METABOLIC PANEL WITH GFR  CK    EKG: EKG Interpretation Date/Time:  Friday June 27 2024 21:30:48 EST Ventricular Rate:  65 PR Interval:  192 QRS Duration:  98 QT Interval:  391 QTC Calculation: 407 R Axis:   67  Text Interpretation: Sinus rhythm Confirmed by Trine Likes [phone]) on 06/28/2024 3:11:39 PM  Radiology: DG Chest 2 View Result Date: 06/27/2024 EXAM: 2 VIEW(S) XRAY OF THE CHEST 06/27/2024 09:27:03 PM COMPARISON: 06/27/2023 CLINICAL HISTORY: weak weak weak weak weak FINDINGS: LINES, TUBES AND DEVICES: Neurostimulator leads overlying thoracic spine. Atrial septal closure device in place. LUNGS AND PLEURA: Hyperlucent bilateral upper lung zones with innumerable blebs, consistent with emphysematous changes. Linear area of scarring in right mid lung zone. Elevated left hemidiaphragm. Surgical staples overlying left upper lung zone. No pleural effusion. No pneumothorax. HEART AND MEDIASTINUM: No acute abnormality of the cardiac and mediastinal silhouettes. BONES AND SOFT TISSUES: No acute osseous abnormality. IMPRESSION: 1. Emphysematous changes in the bilateral upper lung zones. Electronically signed by: Oneil Devonshire MD 06/27/2024 09:29 PM EST RP Workstation: [id number]      Procedures   Medications Ordered in the ED - No data to display                                  Medical Decision Making Amount and/or Complexity of Data Reviewed Labs: ordered. Radiology: ordered. ECG/medicine tests: ordered.   Patient with weakness.  Labs unremarkable along with chest x-ray.  Patient will start a multivitamin a day and follow-up with his PCP     Final diagnoses:  Weakness    ED Discharge Orders     None          Suzette Pac, MD 06/29/24 1015  "

## 2024-09-24 ENCOUNTER — Ambulatory Visit: Admitting: Internal Medicine

## 2024-09-29 ENCOUNTER — Inpatient Hospital Stay

## 2024-10-06 ENCOUNTER — Inpatient Hospital Stay: Admitting: Internal Medicine
